# Patient Record
Sex: Female | Born: 1960
Health system: Southern US, Community
[De-identification: ages and names within clinical notes are randomized; demographics above are authoritative.]

## PROBLEM LIST (undated history)

## (undated) DIAGNOSIS — G709 Myoneural disorder, unspecified: Secondary | ICD-10-CM

## (undated) DIAGNOSIS — IMO0002 Reserved for concepts with insufficient information to code with codable children: Secondary | ICD-10-CM

## (undated) DIAGNOSIS — D649 Anemia, unspecified: Secondary | ICD-10-CM

## (undated) DIAGNOSIS — K5090 Crohn's disease, unspecified, without complications: Secondary | ICD-10-CM

## (undated) DIAGNOSIS — M810 Age-related osteoporosis without current pathological fracture: Secondary | ICD-10-CM

## (undated) DIAGNOSIS — K219 Gastro-esophageal reflux disease without esophagitis: Secondary | ICD-10-CM

## (undated) DIAGNOSIS — M1990 Unspecified osteoarthritis, unspecified site: Secondary | ICD-10-CM

## (undated) DIAGNOSIS — J45909 Unspecified asthma, uncomplicated: Secondary | ICD-10-CM

## (undated) DIAGNOSIS — T148XXA Other injury of unspecified body region, initial encounter: Secondary | ICD-10-CM

## (undated) DIAGNOSIS — C189 Malignant neoplasm of colon, unspecified: Secondary | ICD-10-CM

## (undated) DIAGNOSIS — T7840XA Allergy, unspecified, initial encounter: Secondary | ICD-10-CM

## (undated) DIAGNOSIS — K449 Diaphragmatic hernia without obstruction or gangrene: Secondary | ICD-10-CM

## (undated) DIAGNOSIS — L309 Dermatitis, unspecified: Secondary | ICD-10-CM

## (undated) HISTORY — DX: Unspecified osteoarthritis, unspecified site: M19.90

## (undated) HISTORY — DX: Diaphragmatic hernia without obstruction or gangrene: K44.9

## (undated) HISTORY — DX: Myoneural disorder, unspecified: G70.9

## (undated) HISTORY — DX: Crohn's disease, unspecified, without complications: K50.90

## (undated) HISTORY — DX: Dermatitis, unspecified: L30.9

## (undated) HISTORY — DX: Malignant neoplasm of colon, unspecified: C18.9

## (undated) HISTORY — DX: Unspecified asthma, uncomplicated: J45.909

## (undated) HISTORY — DX: Anemia, unspecified: D64.9

## (undated) HISTORY — DX: Age-related osteoporosis without current pathological fracture: M81.0

## (undated) HISTORY — PX: OTHER SURGICAL HISTORY: SHX169

## (undated) HISTORY — DX: Reserved for concepts with insufficient information to code with codable children: IMO0002

## (undated) HISTORY — DX: Gastro-esophageal reflux disease without esophagitis: K21.9

## (undated) HISTORY — DX: Allergy, unspecified, initial encounter: T78.40XA

---

## 2012-10-03 LAB — HM COLONOSCOPY

## 2013-11-20 HISTORY — PX: NASAL SINUS SURGERY: SHX719

## 2020-07-30 ENCOUNTER — Other Ambulatory Visit: Payer: Self-pay

## 2020-07-30 DIAGNOSIS — Z20822 Contact with and (suspected) exposure to covid-19: Secondary | ICD-10-CM

## 2020-08-02 LAB — NOVEL CORONAVIRUS, NAA: SARS-CoV-2, NAA: NOT DETECTED

## 2021-01-20 ENCOUNTER — Other Ambulatory Visit: Payer: Self-pay | Admitting: Orthopedic Surgery

## 2021-01-20 ENCOUNTER — Other Ambulatory Visit: Payer: Self-pay

## 2021-01-20 ENCOUNTER — Telehealth: Payer: Self-pay | Admitting: Orthopedic Surgery

## 2021-01-20 ENCOUNTER — Telehealth: Payer: Self-pay

## 2021-01-20 DIAGNOSIS — U071 COVID-19: Secondary | ICD-10-CM

## 2021-01-20 MED ORDER — BENZONATATE 100 MG PO CAPS: 100.0000 mg | ORAL_CAPSULE | Freq: Three times a day (TID) | ORAL | 0 refills | Status: DC | PRN

## 2021-01-20 MED ORDER — NAPROXEN 500 MG PO TABS: 500.0000 mg | ORAL_TABLET | Freq: Two times a day (BID) | ORAL | 0 refills | Status: DC

## 2021-01-20 NOTE — Telephone Encounter (Signed)
Patient had a e-visit today.

## 2021-01-20 NOTE — Progress Notes (Signed)
E-Visit for Corona Virus Screening  We are sorry you are not feeling well. We are here to help!  You have tested positive for COVID-19, meaning that you were infected with the novel coronavirus and could give the virus to others.  It is vitally important that you stay home so you do not spread it to others.      Please continue isolation at home, for at least 10 days since the start of your symptoms and until you have had 24 hours with no fever (without taking a fever reducer) and with improving of symptoms.  If you have no symptoms but tested positive (or all symptoms resolve after 5 days and you have no fever) you can leave your house but continue to wear a mask around others for an additional 5 days. If you have a fever,continue to stay home until you have had 24 hours of no fever. Most cases improve 5-10 days from onset but we have seen a small number of patients who have gotten worse after the 10 days.  Please be sure to watch for worsening symptoms and remain taking the proper precautions.   Go to the nearest hospital ED for assessment if fever/cough/breathlessness are severe or illness seems like a threat to life.    The following symptoms may appear 2-14 days after exposure: . Fever . Cough . Shortness of breath or difficulty breathing . Chills . Repeated shaking with chills . Muscle pain . Headache . Sore throat . New loss of taste or smell . Fatigue . Congestion or runny nose . Nausea or vomiting . Diarrhea  You have been enrolled in [facility] for COVID-19. Daily you will receive a questionnaire within the [facility] website. Our COVID-19 response team will be monitoring your responses daily.  You can use medication such as A prescription cough medication called Tessalon Perles 100 mg. You may take 1-2 capsules every 8 hours as needed for cough and A prescription anti-inflammatory called Naprosyn 500 mg. Take twice daily as needed for fever or body aches for 2  weeks  We have asked the monoclonal antibody team contact you to discuss the infusion with you and potentially set this up. You should hear from them in the next 48 hours.    You may also take acetaminophen (Tylenol) as needed for fever.  HOME CARE: . Only take medications as instructed by your medical team. . Drink plenty of fluids and get plenty of rest. . A steam or ultrasonic humidifier can help if you have congestion.   GET HELP RIGHT AWAY IF YOU HAVE EMERGENCY WARNING SIGNS.  Call 911 or proceed to your closest emergency facility if: . You develop worsening high fever. . Trouble breathing . Bluish lips or face . Persistent pain or pressure in the chest . New confusion . Inability to wake or stay awake . You cough up blood. . Your symptoms become more severe . Inability to hold down food or fluids  This list is not all possible symptoms. Contact your medical provider for any symptoms that are severe or concerning to you.    Your e-visit answers were reviewed by a board certified advanced clinical practitioner to complete your personal care plan.  Depending on the condition, your plan could have included both over the counter or prescription medications.  If there is a problem please reply once you have received a response from your provider.  Your safety is important to [place].  If you have drug allergies check  your prescription carefully.    You can use MyChart to ask questions about today's visit, request a non-urgent call back, or ask for a work or school excuse for 24 hours related to this e-Visit. If it has been greater than 24 hours you will need to follow up with your provider, or enter a new e-Visit to address those concerns. You will get an e-mail in the next two days asking about your experience.  I hope that your e-visit has been valuable and will speed your recovery. Thank you for using e-visits.    Greater than 5 minutes, yet less than 10 minutes of time have been  spent researching, coordinating and implementing care for this patient today.

## 2021-01-21 ENCOUNTER — Telehealth: Payer: Self-pay | Admitting: Unknown Physician Specialty

## 2021-01-21 ENCOUNTER — Telehealth: Payer: Self-pay

## 2021-01-21 ENCOUNTER — Ambulatory Visit (HOSPITAL_COMMUNITY)
Admission: RE | Admit: 2021-01-21 | Discharge: 2021-01-21 | Disposition: A | Discharge status: Home / Self Care | Payer: BC Managed Care – PPO | Source: Ambulatory Visit | Attending: Pulmonary Disease | Admitting: Pulmonary Disease

## 2021-01-21 ENCOUNTER — Other Ambulatory Visit: Payer: Self-pay | Admitting: Nurse Practitioner

## 2021-01-21 DIAGNOSIS — U071 COVID-19: Secondary | ICD-10-CM | POA: Insufficient documentation

## 2021-01-21 LAB — BASIC METABOLIC PANEL
Anion gap: 10 (ref 5–15)
BUN: 10 mg/dL (ref 6–20)
CO2: 23 mmol/L (ref 22–32)
Calcium: 9.2 mg/dL (ref 8.9–10.3)
Chloride: 101 mmol/L (ref 98–111)
Creatinine, Ser: 0.76 mg/dL (ref 0.44–1.00)
GFR, Estimated: >60 mL/min (ref >60)
Glucose, Bld: 121 mg/dL — ABNORMAL HIGH (ref 70–99)
Potassium: 4 mmol/L (ref 3.5–5.1)
Sodium: 134 mmol/L — ABNORMAL LOW (ref 135–145)

## 2021-01-21 MED ORDER — NIRMATRELVIR/RITONAVIR (PAXLOVID)TABLET: 3.0000 | ORAL_TABLET | Freq: Two times a day (BID) | ORAL | 0 refills | Status: AC

## 2021-01-21 MED FILL — PAXLOVID 20 X 150 MG & 10 X: 20 X 150 MG | 5 days supply | Qty: 30 | Fill #0

## 2021-01-21 NOTE — Telephone Encounter (Signed)
Called to discuss with patient about COVID-19 symptoms and the use of one of the available treatments for those with mild to moderate Covid symptoms and at a high risk of hospitalization.  Pt appears to qualify for outpatient treatment due to co-morbid conditions and/or a member of an at-risk group in accordance with the FDA Emergency Use Authorization.    Symptom onset: 01/20/21 Cough,sore throat,chills Vaccinated: Yes Booster? Yes Immunocompromised?  Qualifiers: Asthma Home test Pt. Would like to speak with APP.  Marcello Moores

## 2021-01-21 NOTE — Telephone Encounter (Signed)
Outpatient Oral COVID Treatment Note  I connected with Lindsey Knight on 01/21/2021/2:49 PM by telephone and verified that I am speaking with the correct person using two identifiers.  I discussed the limitations, risks, security, and privacy concerns of performing an evaluation and management service by telephone and the availability of in person appointments. I also discussed with the patient that there may be a patient responsible charge related to this service. The patient expressed understanding and agreed to proceed.  Patient location: home Provider location: home  Diagnosis: COVID-19 infection  Purpose of visit: Discussion of potential use of Molnupiravir or Paxlovid, a new treatment for mild to moderate COVID-19 viral infection in non-hospitalized patients.   Subjective: Patient is a 60 y.o. female who has been diagnosed with COVID 19 viral infection.  Their symptoms began on 01/20/21 with sore throat cough.    No past medical history on file.  Not on File   Current Outpatient Medications:  .  albuterol (VENTOLIN HFA) 108 (90 Base) MCG/ACT inhaler, Inhale 1 puff into the lungs every 6 (six) hours as needed., Disp: , Rfl:  .  benzonatate (TESSALON) 100 MG capsule, Take 1 capsule (100 mg total) by mouth 3 (three) times daily as needed for cough., Disp: 20 capsule, Rfl: 0 .  Fluticasone-Salmeterol (ADVAIR) 100-50 MCG/DOSE AEPB, Inhale 1 puff into the lungs 2 (two) times daily as needed., Disp: , Rfl:  .  montelukast (SINGULAIR) 10 MG tablet, Take 10 mg by mouth at bedtime., Disp: , Rfl:  .  naproxen (NAPROSYN) 500 MG tablet, Take 1 tablet (500 mg total) by mouth 2 (two) times daily with a meal., Disp: 60 tablet, Rfl: 0 .  traZODone (DESYREL) 50 MG tablet, Take 50 mg by mouth at bedtime., Disp: , Rfl:   Objective: Patient appears/sounds congested.  They are in no apparent distress.  Breathing is non labored.  Mood and behavior are normal.  Laboratory Data:  Recent Results (from the past  2160 hour(s))  Basic metabolic panel     Status: Abnormal   Collection Time: 01/21/21  2:09 PM  Result Value Ref Range   Sodium 134 (L) 135 - 145 mmol/L   Potassium 4.0 3.5 - 5.1 mmol/L   Chloride 101 98 - 111 mmol/L   CO2 23 22 - 32 mmol/L   Glucose, Bld 121 (H) 70 - 99 mg/dL    Comment: Glucose reference range applies only to samples taken after fasting for at least 8 hours.   BUN 10 6 - 20 mg/dL   Creatinine, Ser 0.76 0.44 - 1.00 mg/dL   Calcium 9.2 8.9 - 10.3 mg/dL   GFR, Estimated >60 >60 mL/min    Comment: (NOTE) Calculated using the CKD-EPI Creatinine Equation (2021)    Anion gap 10 5 - 15    Comment: Performed at [facility], [place] [address], [place], [place] [address]     Assessment: 60 y.o. female with mild/moderate COVID 19 viral infection diagnosed on 01/20/2021 at high risk for progression to severe COVID 19.  Plan:  This patient is a 60 y.o. female that meets the following criteria for Emergency Use Authorization of: Paxlovid 1. Age >12 yr AND > 40 kg 2. SARS-COV-2 positive test 3. Symptom onset < 5 days 4. Mild-to-moderate COVID disease with high risk for severe progression to hospitalization or death  I have spoken and communicated the following to the patient or parent/caregiver regarding: 1. Paxlovid is an unapproved drug that is authorized for use under an  Emergency Use Authorization.  2. There are no adequate, approved, available products for the treatment of COVID-19 in adults who have mild-to-moderate COVID-19 and are at high risk for progressing to severe COVID-19, including hospitalization or death. 3. Other therapeutics are currently authorized. For additional information on all products authorized for treatment or prevention of COVID-19, please see [employer].  4. There are benefits and risks of taking this treatment as outlined  in the "Fact Sheet for Patients and Caregivers."  5. "Fact Sheet for Patients and Caregivers" was reviewed with patient. A hard copy will be provided to patient from pharmacy prior to the patient receiving treatment. 6. Patients should continue to self-isolate and use infection control measures (e.g., wear mask, isolate, social distance, avoid sharing personal items, clean and disinfect "high touch" surfaces, and frequent handwashing) according to CDC guidelines.  7. The patient or parent/caregiver has the option to accept or refuse treatment. 8. Patient medication history was reviewed for potential drug interactions:Interaction with home meds: steroid inhaler she will stop during Paxlovid 9. Patient's GFR was calculated to be >60, and they were therefore prescribed Normal dose (GFR>60) - nirmatrelvir 156m tab (2 tablet) by mouth twice daily AND ritonavir 1022mtab (1 tablet) by mouth twice daily   After reviewing above information with the patient, the patient agrees to receive Paxlovid.  Follow up instructions:    . Take prescription BID x 5 days as directed . Reach out to pharmacist for counseling on medication if desired . For concerns regarding further COVID symptoms please follow up with your PCP or urgent care . For urgent or life-threatening issues, seek care at your local emergency department  The patient was provided an opportunity to ask questions, and all were answered. The patient agreed with the plan and demonstrated an understanding of the instructions.   [facility]  The patient was advised to call their PCP or seek an in-person evaluation if the symptoms worsen or if the condition fails to improve as anticipated.   I provided 15 minutes of non face-to-face telephone visit time during this encounter, and > 50% was spent counseling as documented under my assessment & plan.  ChKathrine HaddockNP 01/21/2021 /2:49 PM

## 2021-01-21 NOTE — Progress Notes (Signed)
Called to Discuss with patient about Covid symptoms and the use of the monoclonal antibody/antiviral infusion/oral therapies for those with mild to moderate Covid symptoms and at a high risk of hospitalization.     Pt appears to qualify for this infusion due to co-morbid conditions and/or a member of an at-risk group in accordance with the FDA Emergency Use Authorization.   Pt is qualified for the monoclonal antibody infusion, but due to medication shortages we cannot offer the pt the infusion at this time. This decision was based on clinical judgement as well as the the NIH Covid 19 treatment guidelines for treatment prioritization and equitable access. Symptoms tier reviewed as well as criteria for ending isolation. Preventative practices reviewed. Patient verbalized understanding.   Symptom onset: 01/20/2021 Vaccinated: Yes  Booster:Yes Immunocompromised: Qualifiers: Asthma/BMI  Patient declines infusion and is requesting Paxlovid. She has not labs since 05/2020. Education provided on the need for updated labwork prior to prescribing. She is in agreement and is aware staff will be arranging for labs to be done.   Education provided on Paxlovid. Patient verbalized understanding.   Alda Lea, NP [facility]  [phone]

## 2021-01-28 ENCOUNTER — Encounter (INDEPENDENT_AMBULATORY_CARE_PROVIDER_SITE_OTHER): Payer: Self-pay

## 2021-01-28 ENCOUNTER — Telehealth: Payer: Self-pay

## 2021-01-28 NOTE — Telephone Encounter (Signed)
Patient states that she has post nasal drip and that's were the cough is coming from. Patient was advise on cough per protocol and verbalized understanding.    If cough remains the same or better: continue to treat with over the counter medications. Hard candy or cough drops and drinking warm fluids. Adults can also use honey 2 tsp (10 ML) at bedtime.   HONEY IS NOT RECOMMENDED FOR INFANTS UNDER ONE.    If cough is becoming worse even with the use of over the counter medications and patient is not able to sleep at night, cough becomes productive with sputum that maybe yellow or green in color, contact PCP.

## 2021-07-26 ENCOUNTER — Ambulatory Visit (INDEPENDENT_AMBULATORY_CARE_PROVIDER_SITE_OTHER): Payer: BC Managed Care – PPO | Admitting: Physician Assistant

## 2021-07-26 ENCOUNTER — Other Ambulatory Visit: Payer: Self-pay

## 2021-07-26 ENCOUNTER — Encounter: Payer: Self-pay | Admitting: Physician Assistant

## 2021-07-26 VITALS — BP 130/70 | HR 88 | Temp 97.9°F | Ht 63.0 in | Wt 161.2 lb

## 2021-07-26 DIAGNOSIS — G4700 Insomnia, unspecified: Secondary | ICD-10-CM

## 2021-07-26 DIAGNOSIS — Z23 Encounter for immunization: Secondary | ICD-10-CM | POA: Diagnosis not present

## 2021-07-26 DIAGNOSIS — R222 Localized swelling, mass and lump, trunk: Secondary | ICD-10-CM | POA: Diagnosis not present

## 2021-07-26 DIAGNOSIS — Z9109 Other allergy status, other than to drugs and biological substances: Secondary | ICD-10-CM | POA: Diagnosis not present

## 2021-07-26 MED ORDER — TRAZODONE HCL 50 MG PO TABS: ORAL_TABLET | ORAL | 0 refills | Status: DC

## 2021-07-26 MED ORDER — MONTELUKAST SODIUM 10 MG PO TABS
10.0000 mg | ORAL_TABLET | Freq: Every day | ORAL | 3 refills | Status: DC
Start: 2021-07-26 — End: 2022-06-28

## 2021-07-26 NOTE — Patient Instructions (Signed)
It was great to see you!  Please schedule your mammogram  Trial trazodone 50 mg daily x 3 days Then increase 100 mg daily   If no improvement in sleep, let me know in 2-4 weeks  Sleep Hygiene  Do: (1) Go to bed at the same time each day. (2) Get up from bed at the same time each day. (3) Get regular exercise each day, preferably in the morning.  There is goof evidence that regular exercise improves restful sleep.  This includes stretching and aerobic exercise. (4) Get regular exposure to outdoor or bright lights, especially in the late afternoon. (5) Keep the temperature in your bedroom comfortable. (6) Keep the bedroom quiet when sleeping. (7) Keep the bedroom dark enough to facilitate sleep. (8) Use your bed only for sleep and sex. (9) Take medications as directed.  It is helpful to take prescribed sleeping pills 1 hour before bedtime, so they are causing drowsiness when you lie down, or 10 hours before getting up, to avoid daytime drowsiness. (10) Use a relaxation exercise just before going to sleep -- imagery, massage, warm bath. (11) Keep your feet and hands warm.  Wear warm socks and/or mittens or gloves to bed.  Don't: (1) Exercise just before going to bed. (2) Engage in stimulating activity just before bed, such as playing a competitive game, watching an exciting program on television, or having an important discussion with a loved one. (3) Have caffeine in the evening (coffee, teas, chocolate, sodas, etc.) (4) Read or watch television in bed. (5) Use alcohol to help you sleep. (6) Go to bed too hungry or too full. (7) Take another person's sleeping pills. (8) Take over-the-counter sleeping pills, without your doctor's knowledge.  Tolerance can develop rapidly with these medications.  Diphenhydramine can have serious side effects for elderly patients. (9) Take daytime naps. (10) Command yourself to go to sleep.  This only makes your mind and body more alert.  If you lie  awake for more than 20-30 minutes, get up, go to a different room, participate in a quiet activity (Ex - non-excitable reading or television), and then return to bed when you feel sleepy.  Do this as many times during the night as needed.  This may cause you to have a night or two of poor sleep but it will train your brain to know when it is time for sleep.  ---  If a referral was placed today, you will be contacted for an appointment. Please note that routine referrals can sometimes take up to 3-4 weeks to process. Please call our office if you haven't heard anything after this time frame.  Take care,  [name] PA-C

## 2021-07-26 NOTE — Progress Notes (Signed)
Lindsey Knight is a 60 y.o. female here to establish care..  I acted as a Education administrator for [employer], PA-C Anselmo Pickler, LPN   History of Present Illness:   Chief Complaint  Patient presents with   Establish Care   Insomnia   Mass   Insomnia Pt has been having trouble sleeping for years. She was on Ambien 5 mg HS but ran out in November 2021. Was prescribed this about 10 years ago. Was previously prescribed trazodone 50 mg but did not help with this.  Pt has tried Melatonin OTC no help, benadryl causing grogginess. She also trialed trazodone 50 mg in the past without any relief of symptoms.  Allergies Takes singulair prn and would like refill. Works well for her breathing.  Lump Pt has a lump mid back area since April. Pt says that it is not painful. Denies fever, chills, bony pain, known trauma.  Weight -- Weight: 161 lb 4 oz (73.1 kg)   No flowsheet data found.  No flowsheet data found.   Other providers/specialists: Patient Care Team: [name], [place] as PCP - General (Physician Assistant)   Past Medical History:  Diagnosis Date   Asthma    Vaginal delivery    x 2, 1985, 1990     Social History   Tobacco Use   Smoking status: Never   Smokeless tobacco: Never  Vaping Use   Vaping Use: Never used  Substance Use Topics   Alcohol use: Yes    Alcohol/week: 4.0 standard drinks    Types: 4 Shots of liquor per week   Drug use: Yes    Types: Other-see comments    Comment: THC gummies    Past Surgical History:  Procedure Laterality Date   NASAL SINUS SURGERY Bilateral 2015    History reviewed. No pertinent family history.  Not on File   Current Medications:   Current Outpatient Medications:    albuterol (VENTOLIN HFA) 108 (90 Base) MCG/ACT inhaler, Inhale 1 puff into the lungs every 6 (six) hours as needed., Disp: , Rfl:    Cannabinoids (THC FREE PO), Take 5-10 mg by mouth at bedtime., Disp: , Rfl:    fexofenadine (ALLEGRA) 180 MG tablet, Take 180  mg by mouth daily., Disp: , Rfl:    Fluticasone-Salmeterol (ADVAIR) 100-50 MCG/DOSE AEPB, Inhale 1 puff into the lungs 2 (two) times daily as needed., Disp: , Rfl:    montelukast (SINGULAIR) 10 MG tablet, Take 1 tablet (10 mg total) by mouth at bedtime., Disp: 90 tablet, Rfl: 3   Multiple Vitamin (MULTIVITAMIN) tablet, Take 1 tablet by mouth. 1-2 times a week, Disp: , Rfl:    traZODone (DESYREL) 50 MG tablet, Take 1 tablet daily x 3 days, then may increase to 100 mg daily, Disp: 90 tablet, Rfl: 0   Review of Systems:   ROS Negative unless otherwise specified per HPI.  Vitals:   Vitals:   07/26/21 1337  BP: 130/70  Pulse: 88  Temp: 97.9 F (36.6 C)  TempSrc: Temporal  SpO2: 98%  Weight: 161 lb 4 oz (73.1 kg)  Height: 5' 3"  (1.6 m)      Body mass index is 28.56 kg/m.  Physical Exam:   Physical Exam Vitals and nursing note reviewed.  Constitutional:      General: She is not in acute distress.    Appearance: She is well-developed. She is not ill-appearing or toxic-appearing.  Cardiovascular:     Rate and Rhythm: Normal rate and regular rhythm.  Pulses: Normal pulses.     Heart sounds: Normal heart sounds, S1 normal and S2 normal.     Comments: No LE edema Pulmonary:     Effort: Pulmonary effort is normal.     Breath sounds: Normal breath sounds.  Musculoskeletal:       Arms:  Skin:    General: Skin is warm and dry.  Neurological:     Mental Status: She is alert.     GCS: GCS eye subscore is 4. GCS verbal subscore is 5. GCS motor subscore is 6.  Psychiatric:        Speech: Speech normal.        Behavior: Behavior normal. Behavior is cooperative.     Assessment and Plan:   Lindsey Knight was seen today for establish care, insomnia and mass.  Diagnoses and all orders for this visit:  Insomnia, unspecified type Uncontrolled Sleepy hygiene discussed Trial trazodone 50 mg x 3 days, then increase to 100 mg daily If no improvement, will trial ambien 5 mg  Mass on  back Suspect lipoma Ultrasound ordered Further work-up on based on results -     US Abdomen Limited; Future  Environmental allergies Refill singulair 10 mg daily Follow-up as needed  Other orders -     traZODone (DESYREL) 50 MG tablet; Take 1 tablet daily x 3 days, then may increase to 100 mg daily -     montelukast (SINGULAIR) 10 MG tablet; Take 1 tablet (10 mg total) by mouth at bedtime.  CMA or LPN served as scribe during this visit. History, Physical, and Plan performed by medical provider. The above documentation has been reviewed and is accurate and complete.  [name], PA-C

## 2021-07-28 ENCOUNTER — Ambulatory Visit
Admission: RE | Admit: 2021-07-28 | Discharge: 2021-07-28 | Disposition: A | Discharge status: Home / Self Care | Payer: BC Managed Care – PPO | Source: Ambulatory Visit | Attending: Physician Assistant | Admitting: Physician Assistant

## 2021-07-28 ENCOUNTER — Encounter: Payer: Self-pay | Admitting: Physician Assistant

## 2021-07-28 DIAGNOSIS — Z1231 Encounter for screening mammogram for malignant neoplasm of breast: Secondary | ICD-10-CM | POA: Diagnosis not present

## 2021-07-28 DIAGNOSIS — R222 Localized swelling, mass and lump, trunk: Secondary | ICD-10-CM

## 2021-07-28 DIAGNOSIS — R1900 Intra-abdominal and pelvic swelling, mass and lump, unspecified site: Secondary | ICD-10-CM | POA: Diagnosis not present

## 2021-07-28 LAB — HM MAMMOGRAPHY

## 2021-08-10 ENCOUNTER — Encounter: Payer: Self-pay | Admitting: Physician Assistant

## 2021-08-24 ENCOUNTER — Encounter: Payer: Self-pay | Admitting: Physician Assistant

## 2021-08-24 ENCOUNTER — Other Ambulatory Visit: Payer: Self-pay | Admitting: Physician Assistant

## 2021-08-25 ENCOUNTER — Encounter: Payer: Self-pay | Admitting: Physician Assistant

## 2021-08-25 NOTE — Telephone Encounter (Signed)
Okay to send in Trazodone 100 mg daily at hs?

## 2021-08-26 ENCOUNTER — Other Ambulatory Visit (HOSPITAL_COMMUNITY): Payer: Self-pay

## 2021-08-26 MED ORDER — TRAZODONE HCL 100 MG PO TABS
100.0000 mg | ORAL_TABLET | Freq: Every day | ORAL | 0 refills | Status: DC
  Filled 2021-08-26: qty 90, 90d supply, fill #0

## 2021-08-29 ENCOUNTER — Other Ambulatory Visit (HOSPITAL_COMMUNITY): Payer: Self-pay

## 2021-08-29 MED ORDER — TRAZODONE HCL 100 MG PO TABS: 100.0000 mg | ORAL_TABLET | Freq: Every day | ORAL | 0 refills | Status: DC

## 2021-08-29 NOTE — Telephone Encounter (Signed)
[place] and spoke to [place] told her to cancel Rx for Trazodone 100 mg. [place] verbalized understanding. Rx resent to [facility] as requested by patient.

## 2021-08-29 NOTE — Addendum Note (Signed)
Addended by: Marian Sorrow on: 08/29/2021 09:58 AM   Modules accepted: Orders

## 2021-11-02 ENCOUNTER — Other Ambulatory Visit: Payer: Self-pay | Admitting: Physician Assistant

## 2021-11-02 MED ORDER — TRAZODONE HCL 100 MG PO TABS: 100.0000 mg | ORAL_TABLET | Freq: Every day | ORAL | 0 refills | Status: DC

## 2021-11-18 ENCOUNTER — Ambulatory Visit (INDEPENDENT_AMBULATORY_CARE_PROVIDER_SITE_OTHER): Payer: BC Managed Care – PPO | Admitting: Physician Assistant

## 2021-11-18 ENCOUNTER — Other Ambulatory Visit (INDEPENDENT_AMBULATORY_CARE_PROVIDER_SITE_OTHER): Payer: BC Managed Care – PPO

## 2021-11-18 ENCOUNTER — Encounter: Payer: Self-pay | Admitting: Physician Assistant

## 2021-11-18 ENCOUNTER — Other Ambulatory Visit: Payer: BC Managed Care – PPO

## 2021-11-18 ENCOUNTER — Other Ambulatory Visit: Payer: Self-pay

## 2021-11-18 ENCOUNTER — Ambulatory Visit (INDEPENDENT_AMBULATORY_CARE_PROVIDER_SITE_OTHER): Payer: BC Managed Care – PPO

## 2021-11-18 VITALS — BP 134/74 | HR 77 | Temp 98.2°F | Ht 63.0 in | Wt 151.2 lb

## 2021-11-18 DIAGNOSIS — D691 Qualitative platelet defects: Secondary | ICD-10-CM

## 2021-11-18 DIAGNOSIS — R071 Chest pain on breathing: Secondary | ICD-10-CM

## 2021-11-18 DIAGNOSIS — D649 Anemia, unspecified: Secondary | ICD-10-CM | POA: Diagnosis not present

## 2021-11-18 DIAGNOSIS — D75839 Thrombocytosis, unspecified: Secondary | ICD-10-CM | POA: Diagnosis not present

## 2021-11-18 DIAGNOSIS — R0781 Pleurodynia: Secondary | ICD-10-CM | POA: Diagnosis not present

## 2021-11-18 LAB — COMPREHENSIVE METABOLIC PANEL
ALT: 11 U/L (ref 0–35)
AST: 21 U/L (ref 0–37)
Albumin: 4.1 g/dL (ref 3.5–5.2)
Alkaline Phosphatase: 87 U/L (ref 39–117)
BUN: 11 mg/dL (ref 6–23)
CO2: 26 mEq/L (ref 19–32)
Calcium: 9.2 mg/dL (ref 8.4–10.5)
Chloride: 103 mEq/L (ref 96–112)
Creatinine, Ser: 0.8 mg/dL (ref 0.40–1.20)
GFR: 79.46 mL/min (ref >60.00)
Glucose, Bld: 112 mg/dL — ABNORMAL HIGH (ref 70–99)
Potassium: 3.8 mEq/L (ref 3.5–5.1)
Sodium: 136 mEq/L (ref 135–145)
Total Bilirubin: 0.3 mg/dL (ref 0.2–1.2)
Total Protein: 7.5 g/dL (ref 6.0–8.3)

## 2021-11-18 LAB — CBC WITH DIFFERENTIAL/PLATELET
Basophils Absolute: 0 10*3/uL (ref 0.0–0.1)
Basophils Relative: 0.7 % (ref 0.0–3.0)
Eosinophils Absolute: 0.4 10*3/uL (ref 0.0–0.7)
Eosinophils Relative: 6.1 % — ABNORMAL HIGH (ref 0.0–5.0)
HCT: 33.6 % — ABNORMAL LOW (ref 36.0–46.0)
Hemoglobin: 11 g/dL — ABNORMAL LOW (ref 12.0–15.0)
Lymphocytes Relative: 28.4 % (ref 12.0–46.0)
Lymphs Abs: 1.7 10*3/uL (ref 0.7–4.0)
MCHC: 32.8 g/dL (ref 30.0–36.0)
MCV: 74.1 fl — ABNORMAL LOW (ref 78.0–100.0)
Monocytes Absolute: 0.6 10*3/uL (ref 0.1–1.0)
Monocytes Relative: 9.4 % (ref 3.0–12.0)
Neutro Abs: 3.2 10*3/uL (ref 1.4–7.7)
Neutrophils Relative %: 55.4 % (ref 43.0–77.0)
Platelets: 444 10*3/uL — ABNORMAL HIGH (ref 150.0–400.0)
RBC: 4.53 Mil/uL (ref 3.87–5.11)
RDW: 15.2 % (ref 11.5–15.5)
WBC: 5.9 10*3/uL (ref 4.0–10.5)

## 2021-11-18 NOTE — Patient Instructions (Signed)
It was great to see you!  An order for an xray and labs has been put in for you. To get your xray, you can walk in at the [facility] location without a scheduled appointment.  The address is [address]. It is across the street from [place] is located in the basement.  Hours of operation are M-F 8:30am to 5:00pm. Please note that they are closed for lunch between 12:30 and 1:00pm.  If new chest pain or shortness of breath, please go to the ER.  Take care,  [name] PA-C

## 2021-11-18 NOTE — Progress Notes (Signed)
Lindsey Knight is a 60 y.o. female here for elevated platelet count and rib pain.   History of Present Illness:   Chief Complaint  Patient presents with   Chest Pain    Fall in October ,pain left and came back.    HPI  Elevated Blood Platelet Count According to [facility], she has been giving platelets for some time, but was recently alerted that her platelets had increased.  Denies hx of blood clots, LE swelling, known personal clotting disorder or sickle cell. Listed below are pass platelets results following her donations, presented by patient.   11/08/21 -- 12.8 hgb, 705 platelets 10/25/21 - 12.8 hgb, 504 platelets 09/23/21 - 12.5 hgb, 404 platelets  Chest Pain In addition to her high blood platelet count, she has been experiencing right sided rib pain and right sided chest pain. [name] does recall falling while on a hike and injuring the right side of her chest back in October of this year. Reports that she felt sore after the fall for a couple of days, but this soon resolved itself. As of couple of weeks ago, the pain has returned and is described as a poking sensation. She states that this sensation isn't necessarily painful but worsens upon inhalation.  At this time she finds it hard to sleep due to the discomfort that she feels. Pt does have a hx of asthma.  She has used ibuprofen and this was helpful.  [name] did recently have what she believes to be influenza earlier this month while she was in [place]. During that time she was experiencing fever, chills, and myalgia. In an effort to treat her sx, she took azithromycin on 11/01/21 which provided relief. Denies SOB, night sweats, hemoptysis.  She is UTD on mammogram.  Past Medical History:  Diagnosis Date   Asthma    Vaginal delivery    x 2, 1985, 1990     Social History   Tobacco Use   Smoking status: Never   Smokeless tobacco: Never  Vaping Use   Vaping Use: Never used  Substance Use Topics   Alcohol use: Yes     Alcohol/week: 4.0 standard drinks    Types: 4 Shots of liquor per week   Drug use: Yes    Types: Other-see comments    Comment: THC gummies    Past Surgical History:  Procedure Laterality Date   NASAL SINUS SURGERY Bilateral 2015    History reviewed. No pertinent family history.  Not on File  Current Medications:   Current Outpatient Medications:    albuterol (VENTOLIN HFA) 108 (90 Base) MCG/ACT inhaler, Inhale 1 puff into the lungs every 6 (six) hours as needed., Disp: , Rfl:    Cannabinoids (THC FREE PO), Take 5-10 mg by mouth at bedtime., Disp: , Rfl:    fexofenadine (ALLEGRA) 180 MG tablet, Take 180 mg by mouth daily., Disp: , Rfl:    Fluticasone-Salmeterol (ADVAIR) 100-50 MCG/DOSE AEPB, Inhale 1 puff into the lungs 2 (two) times daily as needed., Disp: , Rfl:    montelukast (SINGULAIR) 10 MG tablet, Take 1 tablet (10 mg total) by mouth at bedtime., Disp: 90 tablet, Rfl: 3   Multiple Vitamin (MULTIVITAMIN) tablet, Take 1 tablet by mouth. 1-2 times a week, Disp: , Rfl:    traZODone (DESYREL) 100 MG tablet, Take 1 tablet (100 mg total) by mouth at bedtime., Disp: 90 tablet, Rfl: 0   Review of Systems:   ROS Negative unless otherwise specified per HPI. Vitals:  Vitals:   11/18/21 1125  BP: 134/74  Pulse: 77  Temp: 98.2 F (36.8 C)  TempSrc: Temporal  SpO2: 98%  Weight: 151 lb 3.2 oz (68.6 kg)  Height: 5' 3"  (1.6 m)     Body mass index is 26.78 kg/m.  Physical Exam:   Physical Exam Vitals and nursing note reviewed.  Constitutional:      General: She is not in acute distress.    Appearance: She is well-developed. She is not ill-appearing or toxic-appearing.  Cardiovascular:     Rate and Rhythm: Normal rate and regular rhythm.     Pulses: Normal pulses.     Heart sounds: Normal heart sounds, S1 normal and S2 normal.  Pulmonary:     Effort: Pulmonary effort is normal.     Breath sounds: Normal breath sounds.  Chest:     Chest wall: Tenderness present.      Comments: Tenderness slightly above right breast, reproducible upon deep palpation Skin:    General: Skin is warm and dry.  Neurological:     Mental Status: She is alert.     GCS: GCS eye subscore is 4. GCS verbal subscore is 5. GCS motor subscore is 6.  Psychiatric:        Speech: Speech normal.        Behavior: Behavior normal. Behavior is cooperative.    Assessment and Plan:   Chest pain on breathing EKG tracing is personally reviewed.  EKG notes NSR.  No acute changes.  Possible costochondritis CXR ordered for further evaluation Informed patient that if new chest pain or shortness of breath occurs, to visit the ER immediately Follow up as indicated by results  Abnormal platelets (HCC) Unclear etiology Repeat CBC and add smear Low threshold to refer to hematology for further evaluation  Patient verbalized understanding to plan.  I,Havlyn C Ratchford,acting as a Education administrator for [employer], PA.,have documented all relevant documentation on the behalf of [name], PA,as directed by  [name], PA while in the presence of [name], [place].  I, [name], [place], have reviewed all documentation for this visit. The documentation on 11/18/21 for the exam, diagnosis, procedures, and orders are all accurate and complete.  [name], PA-C

## 2021-11-22 ENCOUNTER — Encounter: Payer: Self-pay | Admitting: Physician Assistant

## 2021-11-22 ENCOUNTER — Other Ambulatory Visit: Payer: Self-pay | Admitting: Physician Assistant

## 2021-11-22 DIAGNOSIS — R071 Chest pain on breathing: Secondary | ICD-10-CM

## 2021-11-22 DIAGNOSIS — R7989 Other specified abnormal findings of blood chemistry: Secondary | ICD-10-CM

## 2021-11-22 LAB — PATHOLOGIST SMEAR REVIEW

## 2021-11-29 ENCOUNTER — Ambulatory Visit
Admission: RE | Admit: 2021-11-29 | Discharge: 2021-11-29 | Disposition: A | Discharge status: Home / Self Care | Payer: BC Managed Care – PPO | Source: Ambulatory Visit | Attending: Physician Assistant | Admitting: Physician Assistant

## 2021-11-29 DIAGNOSIS — J9811 Atelectasis: Secondary | ICD-10-CM | POA: Diagnosis not present

## 2021-11-29 DIAGNOSIS — R071 Chest pain on breathing: Secondary | ICD-10-CM

## 2021-11-29 DIAGNOSIS — R918 Other nonspecific abnormal finding of lung field: Secondary | ICD-10-CM | POA: Diagnosis not present

## 2021-11-29 DIAGNOSIS — R590 Localized enlarged lymph nodes: Secondary | ICD-10-CM | POA: Diagnosis not present

## 2021-11-29 MED ORDER — IOPAMIDOL (ISOVUE-300) INJECTION 61%
75.0000 mL | Freq: Once | INTRAVENOUS | Status: AC | PRN
  Administered 2021-11-29: 75 mL via INTRAVENOUS

## 2021-11-30 ENCOUNTER — Other Ambulatory Visit: Payer: Self-pay | Admitting: Physician Assistant

## 2021-11-30 ENCOUNTER — Encounter: Payer: Self-pay | Admitting: Physician Assistant

## 2021-11-30 DIAGNOSIS — D691 Qualitative platelet defects: Secondary | ICD-10-CM

## 2021-11-30 DIAGNOSIS — R9389 Abnormal findings on diagnostic imaging of other specified body structures: Secondary | ICD-10-CM

## 2021-12-08 ENCOUNTER — Other Ambulatory Visit (INDEPENDENT_AMBULATORY_CARE_PROVIDER_SITE_OTHER): Payer: BC Managed Care – PPO

## 2021-12-08 ENCOUNTER — Other Ambulatory Visit: Payer: BC Managed Care – PPO

## 2021-12-08 DIAGNOSIS — R7989 Other specified abnormal findings of blood chemistry: Secondary | ICD-10-CM | POA: Diagnosis not present

## 2021-12-08 DIAGNOSIS — D691 Qualitative platelet defects: Secondary | ICD-10-CM | POA: Diagnosis not present

## 2021-12-08 LAB — IBC + FERRITIN
Ferritin: 4.3 ng/mL — ABNORMAL LOW (ref 10.0–291.0)
Iron: 23 ug/dL — ABNORMAL LOW (ref 42–145)
Saturation Ratios: 5.1 % — ABNORMAL LOW (ref 20.0–50.0)
TIBC: 455 ug/dL — ABNORMAL HIGH (ref 250.0–450.0)
Transferrin: 325 mg/dL (ref 212.0–360.0)

## 2021-12-08 LAB — CBC WITH DIFFERENTIAL/PLATELET
Basophils Absolute: 0 10*3/uL (ref 0.0–0.1)
Basophils Relative: 0.8 % (ref 0.0–3.0)
Eosinophils Absolute: 0.3 10*3/uL (ref 0.0–0.7)
Eosinophils Relative: 6.4 % — ABNORMAL HIGH (ref 0.0–5.0)
HCT: 35.2 % — ABNORMAL LOW (ref 36.0–46.0)
Hemoglobin: 11.2 g/dL — ABNORMAL LOW (ref 12.0–15.0)
Lymphocytes Relative: 34.5 % (ref 12.0–46.0)
Lymphs Abs: 1.7 10*3/uL (ref 0.7–4.0)
MCHC: 31.8 g/dL (ref 30.0–36.0)
MCV: 74.1 fl — ABNORMAL LOW (ref 78.0–100.0)
Monocytes Absolute: 0.5 10*3/uL (ref 0.1–1.0)
Monocytes Relative: 10 % (ref 3.0–12.0)
Neutro Abs: 2.4 10*3/uL (ref 1.4–7.7)
Neutrophils Relative %: 48.3 % (ref 43.0–77.0)
Platelets: 361 10*3/uL (ref 150.0–400.0)
RBC: 4.75 Mil/uL (ref 3.87–5.11)
RDW: 15 % (ref 11.5–15.5)
WBC: 5 10*3/uL (ref 4.0–10.5)

## 2021-12-08 LAB — VITAMIN B12: Vitamin B-12: 209 pg/mL — ABNORMAL LOW (ref 211–911)

## 2021-12-09 ENCOUNTER — Encounter: Payer: Self-pay | Admitting: Physician Assistant

## 2021-12-09 ENCOUNTER — Other Ambulatory Visit: Payer: Self-pay | Admitting: Physician Assistant

## 2021-12-09 DIAGNOSIS — Z1211 Encounter for screening for malignant neoplasm of colon: Secondary | ICD-10-CM

## 2021-12-09 DIAGNOSIS — D509 Iron deficiency anemia, unspecified: Secondary | ICD-10-CM

## 2021-12-09 NOTE — Telephone Encounter (Signed)
Pt saw response from provider.

## 2021-12-12 ENCOUNTER — Encounter: Payer: Self-pay | Admitting: Nurse Practitioner

## 2021-12-13 ENCOUNTER — Ambulatory Visit (INDEPENDENT_AMBULATORY_CARE_PROVIDER_SITE_OTHER): Payer: BC Managed Care – PPO | Admitting: *Deleted

## 2021-12-13 ENCOUNTER — Telehealth: Payer: Self-pay | Admitting: *Deleted

## 2021-12-13 ENCOUNTER — Other Ambulatory Visit: Payer: Self-pay

## 2021-12-13 DIAGNOSIS — E538 Deficiency of other specified B group vitamins: Secondary | ICD-10-CM

## 2021-12-13 MED ORDER — CYANOCOBALAMIN 1000 MCG/ML IJ SOLN
1000.0000 ug | Freq: Once | INTRAMUSCULAR | Status: AC
  Administered 2021-12-13: 11:00:00 1000 ug via INTRAMUSCULAR

## 2021-12-13 MED ORDER — "BD LUER-LOK SYRINGE 25G X 1"" 3 ML MISC": 0 refills | Status: DC

## 2021-12-13 MED ORDER — CYANOCOBALAMIN 1000 MCG/ML IJ SOLN: INTRAMUSCULAR | 0 refills | Status: DC

## 2021-12-13 NOTE — Telephone Encounter (Signed)
Patient came in for her B 12 injection and stated that she was told that she could do them at home. Patient stated she wanted to get set-up so that she can start doing them herself next week.   Zacarias Pontes, CMA

## 2021-12-13 NOTE — Progress Notes (Signed)
Patient came in for weekly B 12 per orders of [name], [place]. Injection given in left deltoid per patient preference. Patient tolerated well. Patient stated that she was told that she could do injections her self at home and would like to get set-up to start doing them next week.  Zacarias Pontes, CMA

## 2021-12-13 NOTE — Addendum Note (Signed)
Addended by: Marian Sorrow on: 12/13/2021 03:10 PM   Modules accepted: Orders

## 2021-12-13 NOTE — Telephone Encounter (Signed)
Spoke to pt told her sending Vit B12 medication and supplies to the pharmacy. [facility]. Pt verbalized understanding. Told pt to schedule lab appt only about 3 weeks after last B12 injection so we can recheck your levels. Pt verbalized understanding.

## 2021-12-15 ENCOUNTER — Other Ambulatory Visit: Payer: Self-pay | Admitting: *Deleted

## 2021-12-15 MED ORDER — CYANOCOBALAMIN 1000 MCG/ML IJ SOLN: INTRAMUSCULAR | 0 refills | Status: DC

## 2021-12-30 ENCOUNTER — Encounter: Payer: Self-pay | Admitting: Nurse Practitioner

## 2021-12-30 ENCOUNTER — Ambulatory Visit: Payer: BC Managed Care – PPO | Admitting: Nurse Practitioner

## 2021-12-30 VITALS — BP 100/60 | HR 77 | Ht 63.0 in | Wt 153.0 lb

## 2021-12-30 DIAGNOSIS — D509 Iron deficiency anemia, unspecified: Secondary | ICD-10-CM | POA: Diagnosis not present

## 2021-12-30 DIAGNOSIS — K5090 Crohn's disease, unspecified, without complications: Secondary | ICD-10-CM | POA: Insufficient documentation

## 2021-12-30 MED ORDER — SUTAB 1479-225-188 MG PO TABS: 1.0000 | ORAL_TABLET | Freq: Once | ORAL | 0 refills | Status: AC

## 2021-12-30 NOTE — Progress Notes (Signed)
ASSESSMENT AND PLAN    # 61 yo female with iron deficiency anemia. Hgb ~ 11, ferritin 4. No overt GI bleeding. No localizing GI symptoms other than occasional heart burn.  She was taking NSAID from October to December. Rule out erosive disease, PUD, intestinal AVMs, neoplasm.  --Schedule for a EGD and colonoscopy for evaluation of iron deficiency anemia. . The risks and benefits of colonoscopy with possible polypectomy / biopsies were discussed and the patient agrees to proceed.  --Hold oral iron 10 days prior to procedures.   # Vitamin B12 deficiency. B12 209. PCP started replacement.   # [facility] of Crohn's disease in sister.   # [facility] of colon cancer in mother but later in life ( age 66)  HISTORY OF PRESENT ILLNESS     Chief Complaint : anemia  Lindsey Knight is a 61 y.o. female with a past medical history significant for asthma,  COVID March 2022 . See PMH below for any additional history.   Patient referred by PCP for anemia. She regular donates platelets on a regular basis. Recenlty called by [employer] and told her that her platelets were high. She saw her PCP, had repeat labs and platelets were okay but her hgb was low. She does recall being a couple of time in the past being turned away to donate platelets due to borderline hgb. She doesn't ever see blood in her stool. No bowel changes or abdominal pain. No urinary or vaginal bleeding. Last colonoscopy was ~ 10 years ago.   Hgb is stable at 11.2 . Iron deficient with a ferritin of 4 .  Also B12 deficient. No known [place] of celiac disease.   She has mild burning in chest sometimes but no other GI symptoms.   She was taking a lot of Ibuprofen from October through early December after a fall sustained while hiking and then also for fever during a bad respiratory illness in November .Saw PCP for a bad cough and had a chest CT scan on 11/29/21  Chest CT >>some patchy ground-glass and adjacent focal clustered nodularity in the right upper  lobe and adjacent band like atelectasis/scarring at the junction of the right upper middle lobes. This is consistent with an infectious/inflammatory process.  Multiple prominent mediastinal and axillary lymph nodes with an enlarged right hilar lymph node and left axillary lymph node. These are favored to be reactive, attention recommended on follow-up CT. Additional 5 mm ground-glass nodular opacity in the left upper lobe, which may or may not be related to the aforementioned infectious/inflammatory process.   She see Pulmonary next Tuesday. However, she is feeling better from a respiratory standpoint.    Data Reviewed:  CBC Latest Ref Rng & Units 12/08/2021 11/18/2021  WBC 4.0 - 10.5 K/uL 5.0 5.9  Hemoglobin 12.0 - 15.0 g/dL 11.2(L) 11.0(L)  Hematocrit 36.0 - 46.0 % 35.2(L) 33.6(L)  Platelets 150.0 - 400.0 K/uL 361.0 444.0(H)    No results found for: LIPASE CMP Latest Ref Rng & Units 11/18/2021 01/21/2021  Glucose 70 - 99 mg/dL 112(H) 121(H)  BUN 6 - 23 mg/dL 11 10  Creatinine 0.40 - 1.20 mg/dL 0.80 0.76  Sodium 135 - 145 mEq/L 136 134(L)  Potassium 3.5 - 5.1 mEq/L 3.8 4.0  Chloride 96 - 112 mEq/L 103 101  CO2 19 - 32 mEq/L 26 23  Calcium 8.4 - 10.5 mg/dL 9.2 9.2  Total Protein 6.0 - 8.3 g/dL 7.5 -  Total Bilirubin 0.2 - 1.2 mg/dL 0.3 -  Alkaline Phos 39 - 117 U/L 87 -  AST 0 - 37 U/L 21 -  ALT 0 - 35 U/L 11 -    Past Medical History:  Diagnosis Date   Asthma    Vaginal delivery    x 2, 1985, 1990     Past Surgical History:  Procedure Laterality Date   NASAL SINUS SURGERY Bilateral 2015   Family History  Problem Relation Age of Onset   Pancreatic cancer Other    Ovarian cancer Other    Uterine cancer Other    Social History   Tobacco Use   Smoking status: Never   Smokeless tobacco: Never  Vaping Use   Vaping Use: Never used  Substance Use Topics   Alcohol use: Yes    Alcohol/week: 4.0 standard drinks    Types: 4 Shots of liquor per week   Drug use: Yes     Types: Other-see comments    Comment: THC gummies   Current Outpatient Medications  Medication Sig Dispense Refill   albuterol (VENTOLIN HFA) 108 (90 Base) MCG/ACT inhaler Inhale 1 puff into the lungs every 6 (six) hours as needed.     Cannabinoids (THC FREE PO) Take 5-10 mg by mouth at bedtime.     cyanocobalamin (,VITAMIN B-12,) 1000 MCG/ML injection Inject 1 mg intramuscularly into skin once a week x 2 weeks and then monthly x 3 months. 5 mL 0   fexofenadine (ALLEGRA) 180 MG tablet Take 180 mg by mouth daily.     Fluticasone-Salmeterol (ADVAIR) 100-50 MCG/DOSE AEPB Inhale 1 puff into the lungs 2 (two) times daily as needed.     montelukast (SINGULAIR) 10 MG tablet Take 1 tablet (10 mg total) by mouth at bedtime. 90 tablet 3   Multiple Vitamin (MULTIVITAMIN) tablet Take 1 tablet by mouth. 1-2 times a week     SYRINGE-NEEDLE, DISP, 3 ML (B-D 3CC LUER-LOK SYR 25GX1") 25G X 1" 3 ML MISC Use to inject Vitamin B12 into skin. 5 each 0   traZODone (DESYREL) 100 MG tablet Take 1 tablet (100 mg total) by mouth at bedtime. 90 tablet 0   No current facility-administered medications for this visit.   Not on File   Review of Systems:  All other systems reviewed and negative except where noted in HPI.    PHYSICAL EXAM :    Wt Readings from Last 3 Encounters:  12/30/21 153 lb (69.4 kg)  11/18/21 151 lb 3.2 oz (68.6 kg)  07/26/21 161 lb 4 oz (73.1 kg)    BP 100/60    Pulse 77    Ht 5' 3"  (1.6 m)    Wt 153 lb (69.4 kg)    BMI 27.10 kg/m  Constitutional:  Generally well appearing female in no acute distress. Psychiatric: Pleasant. Normal mood and affect. Behavior is normal. EENT: Pupils normal.  Conjunctivae are normal. No scleral icterus. Neck supple.  Cardiovascular: Normal rate, regular rhythm. No edema Pulmonary/chest: Effort normal and breath sounds normal. No wheezing, rales or rhonchi. Abdominal: Soft, nondistended, nontender. Bowel sounds active throughout. There are no masses  palpable. No hepatomegaly. Neurological: Alert and oriented to person place and time. Skin: Skin is warm and dry. No rashes noted.  Tye Savoy, NP  12/30/2021, 11:25 AM  Cc:  Referring Provider [name], PA

## 2021-12-30 NOTE — Patient Instructions (Addendum)
If you are age 62 or older, your body mass index should be between 23-30. Your Body mass index is 27.1 kg/m. If this is out of the aforementioned range listed, please consider follow up with your Primary Care Provider.  If you are age 66 or younger, your body mass index should be between 19-25. Your Body mass index is 27.1 kg/m. If this is out of the aformentioned range listed, please consider follow up with your Primary Care Provider.   The [facility] providers would like to encourage you to use [facility] to communicate with providers for non-urgent requests or questions.  Due to long hold times on the telephone, sending your provider a message by [facility] may be a faster and more efficient way to get a response.  Please allow 48 business hours for a response.  Please remember that this is for non-urgent requests.   Hold oral iron for 10 days prior to EGD and colonoscopy.   You have been scheduled for an endoscopy and colonoscopy. Please follow the written instructions given to you at your visit today. Please pick up your prep supplies at the pharmacy within the next 1-3 days. If you use inhalers (even only as needed), please bring them with you on the day of your procedure.   It was a pleasure to see you today!  Thank you for trusting me with your gastrointestinal care!    Tye Savoy, NP

## 2021-12-30 NOTE — Progress Notes (Signed)
Addendum: Reviewed and agree with assessment and management plan. Azhane Eckart M, MD  

## 2022-01-02 ENCOUNTER — Other Ambulatory Visit: Payer: Self-pay

## 2022-01-02 MED ORDER — SUTAB 1479-225-188 MG PO TABS
ORAL_TABLET | ORAL | 0 refills | Status: DC
Start: 2022-01-02 — End: 2022-01-23

## 2022-01-03 ENCOUNTER — Other Ambulatory Visit: Payer: Self-pay

## 2022-01-03 ENCOUNTER — Encounter: Payer: Self-pay | Admitting: Pulmonary Disease

## 2022-01-03 ENCOUNTER — Ambulatory Visit: Payer: BC Managed Care – PPO | Admitting: Pulmonary Disease

## 2022-01-03 VITALS — BP 132/74 | HR 78 | Ht 63.0 in | Wt 154.0 lb

## 2022-01-03 DIAGNOSIS — J181 Lobar pneumonia, unspecified organism: Secondary | ICD-10-CM | POA: Diagnosis not present

## 2022-01-03 DIAGNOSIS — C189 Malignant neoplasm of colon, unspecified: Secondary | ICD-10-CM | POA: Insufficient documentation

## 2022-01-03 NOTE — Progress Notes (Signed)
Synopsis: Referred in February 2023 for abnormal chest imaging by [name], PA  Subjective:   PATIENT ID: Lindsey Knight GENDER: female DOB: [birth year], MRN: [id number]   HPI  Chief Complaint  Patient presents with   Consult    Referred by PCP for abnormal CT back in January 2023.    Lindsey Knight is a 61 year old woman, never smoker with history of asthma who is referred to pulmonary clinic for abnormal chest imaging.   Patient reports having respiratory illness in mid-December 2022 and was treated with Zpak on 12/13. PCP note from 11/08/2021 reviewed. Chest x-ray on 12/30 showed increased lung markings with mild right mid lung and left basilar linear scaring or atelectasis. She was also noted to have increased platelet count. She had CT Chest on 11/29/21 that showed mediastinal, axillary and hilar lymphadenopathy along with patchy ground-glass and adjacent focal clustered nodularity in the right upper lobe and adjacent band like atelectasis/scarring at the junction of the right upper lobe.   Patient reports resolution of her infectious symptoms from December and is feeling at her baseline. She will occasionally have some right sided discomfort but it is overall improved from December. She notices the pain when laying down or moving when she sleeps. She denies any shortness of breath, fevers, chills or night sweats. She denies hemoptysis. She does have joint aches in general which include her knees, hips and occasional hand stiffness in the mornings.  She has history of asthma and mainly has issues with reactive airways disease when triggered by viral infections and uses inhalers as needed. She denies issues with seasonal allergies. She had sinus surgery in the past with significant reduction in frequency of sinus infections.   She is a never smoker. She is a retired Marine scientist. She has a sister with crohn's disease and her mother had colon cancer.   Past Medical History:  Diagnosis Date    Asthma    Vaginal delivery    x 2, 1985, 1990     Family History  Problem Relation Age of Onset   Pancreatic cancer Other    Ovarian cancer Other    Uterine cancer Other      Social History   Socioeconomic History   Marital status: Married    Spouse name: Not on file   Number of children: Not on file   Years of education: Not on file   Highest education level: Not on file  Occupational History   Not on file  Tobacco Use   Smoking status: Never   Smokeless tobacco: Never  Vaping Use   Vaping Use: Never used  Substance and Sexual Activity   Alcohol use: Yes    Alcohol/week: 4.0 standard drinks    Types: 4 Shots of liquor per week   Drug use: Yes    Types: Other-see comments    Comment: THC gummies   Sexual activity: Not Currently  Other Topics Concern   Not on file  Social History Narrative   Retired Marine scientist   Married   Has two local daughters and grandchildren   Social Determinants of Health   Financial Resource Strain: Not on file  Food Insecurity: Not on file  Transportation Needs: Not on file  Physical Activity: Not on file  Stress: Not on file  Social Connections: Not on file  Intimate Partner Violence: Not on file     No Known Allergies   Outpatient Medications Prior to Visit  Medication Sig Dispense Refill   albuterol (  VENTOLIN HFA) 108 (90 Base) MCG/ACT inhaler Inhale 1 puff into the lungs every 6 (six) hours as needed.     Cannabinoids (THC FREE PO) Take 5-10 mg by mouth at bedtime.     cyanocobalamin (,VITAMIN B-12,) 1000 MCG/ML injection Inject 1 mg intramuscularly into skin once a week x 2 weeks and then monthly x 3 months. 5 mL 0   fexofenadine (ALLEGRA) 180 MG tablet Take 180 mg by mouth daily.     Fluticasone-Salmeterol (ADVAIR) 100-50 MCG/DOSE AEPB Inhale 1 puff into the lungs 2 (two) times daily as needed.     montelukast (SINGULAIR) 10 MG tablet Take 1 tablet (10 mg total) by mouth at bedtime. 90 tablet 3   Multiple Vitamin (MULTIVITAMIN)  tablet Take 1 tablet by mouth. 1-2 times a week     Sodium Sulfate-Mag Sulfate-KCl (SUTAB) [phone] MG TABS Following MD instructions take as 2 separate doses 24 tablet 0   SYRINGE-NEEDLE, DISP, 3 ML (B-D 3CC LUER-LOK SYR 25GX1") 25G X 1" 3 ML MISC Use to inject Vitamin B12 into skin. 5 each 0   traZODone (DESYREL) 100 MG tablet Take 1 tablet (100 mg total) by mouth at bedtime. 90 tablet 0   No facility-administered medications prior to visit.    Review of Systems  Constitutional:  Negative for chills, fever, malaise/fatigue and weight loss.  HENT:  Negative for congestion, sinus pain and sore throat.   Eyes: Negative.   Respiratory:  Negative for cough, hemoptysis, sputum production, shortness of breath and wheezing.   Cardiovascular:  Negative for chest pain, palpitations, orthopnea, claudication and leg swelling.  Gastrointestinal:  Negative for abdominal pain, heartburn, nausea and vomiting.  Genitourinary: Negative.   Musculoskeletal:  Positive for joint pain. Negative for myalgias.  Skin:  Negative for rash.  Neurological:  Negative for weakness.  Endo/Heme/Allergies: Negative.   Psychiatric/Behavioral: Negative.     Objective:   Vitals:   01/03/22 0856  BP: 132/74  Pulse: 78  SpO2: 99%  Weight: 154 lb (69.9 kg)  Height: 5' 3"  (1.6 m)   Physical Exam Constitutional:      General: She is not in acute distress.    Appearance: She is not ill-appearing.  HENT:     Head: Normocephalic and atraumatic.  Eyes:     General: No scleral icterus.    Conjunctiva/sclera: Conjunctivae normal.     Pupils: Pupils are equal, round, and reactive to light.  Cardiovascular:     Rate and Rhythm: Normal rate and regular rhythm.     Pulses: Normal pulses.     Heart sounds: Normal heart sounds. No murmur heard. Pulmonary:     Effort: Pulmonary effort is normal.     Breath sounds: Normal breath sounds. No wheezing, rhonchi or rales.  Abdominal:     General: Bowel sounds are normal.      Palpations: Abdomen is soft.  Musculoskeletal:     Right lower leg: No edema.     Left lower leg: No edema.  Lymphadenopathy:     Cervical: No cervical adenopathy.  Skin:    General: Skin is warm and dry.  Neurological:     General: No focal deficit present.     Mental Status: She is alert.  Psychiatric:        Mood and Affect: Mood normal.        Behavior: Behavior normal.        Thought Content: Thought content normal.        Judgment: Judgment normal.  CBC    Component Value Date/Time   WBC 5.0 12/08/2021 1124   RBC 4.75 12/08/2021 1124   HGB 11.2 (L) 12/08/2021 1124   HCT 35.2 (L) 12/08/2021 1124   PLT 361.0 12/08/2021 1124   MCV 74.1 (L) 12/08/2021 1124   MCHC 31.8 12/08/2021 1124   RDW 15.0 12/08/2021 1124   LYMPHSABS 1.7 12/08/2021 1124   MONOABS 0.5 12/08/2021 1124   EOSABS 0.3 12/08/2021 1124   BASOSABS 0.0 12/08/2021 1124   BMP Latest Ref Rng & Units 11/18/2021 01/21/2021  Glucose 70 - 99 mg/dL 112(H) 121(H)  BUN 6 - 23 mg/dL 11 10  Creatinine 0.40 - 1.20 mg/dL 0.80 0.76  Sodium 135 - 145 mEq/L 136 134(L)  Potassium 3.5 - 5.1 mEq/L 3.8 4.0  Chloride 96 - 112 mEq/L 103 101  CO2 19 - 32 mEq/L 26 23  Calcium 8.4 - 10.5 mg/dL 9.2 9.2   Chest imaging: CT Chest 11/29/21 Patchy ground-glass and adjacent focal clustered nodularity in the right upper lobe and adjacent band like atelectasis/scarring at the junction of the right upper middle lobes. This is consistent with an infectious/inflammatory process. Recommend follow-up chest CT in 3 months to ensure resolution.   Multiple prominent mediastinal and axillary lymph nodes with an enlarged right hilar lymph node and left axillary lymph node. These are favored to be reactive, attention recommended on follow-up CT.   Additional 5 mm ground-glass nodular opacity in the left upper lobe, which may or may not be related to the aforementioned infectious/inflammatory process. This can be reassessed on  follow-up CT.  CXR 11/18/21 Chronic appearing increased lung markings with mild mid right lung and left basilar linear scarring and/or atelectasis.  PFT: No flowsheet data found.  Labs:  Path: Peripheral Smear 11/18/21 WBC are unremarkable. The overall findings in this smear are consistent with a reactive thrombocytosis. Clinical correlation is recommended. Anemia with RBCs which appear to be microcytic and hypochromic on smear review. Suggest evaluation for iron deficiency and/or a source of blood loss, if clinically indicated.   Echo:  Heart Catheterization:    Assessment & Plan:   Lobar pneumonia, unspecified organism ([place]) - Plan: DG Chest 2 View  Discussion: Lindsey Knight is a 61 year old woman, never smoker with history of asthma who is referred to pulmonary clinic for abnormal chest imaging.   Her chest imaging appears related to her recent respiratory infection that she experienced in December when traveling to [place] to care for her parents. Her symptoms are improved at this time except for ongoing right sided discomfort possibly secondary to trauma she experienced from a fall.  We discussed repeating chest imaging to ensure that these findings resolve. She would like to have a repeat chest radiograph before having another CT chest if absolutely needed.  Follow up in 2 months with chest x-ray.  Freda Jackson, MD [facility] Office: [phone]    Current Outpatient Medications:    albuterol (VENTOLIN HFA) 108 (90 Base) MCG/ACT inhaler, Inhale 1 puff into the lungs every 6 (six) hours as needed., Disp: , Rfl:    Cannabinoids (THC FREE PO), Take 5-10 mg by mouth at bedtime., Disp: , Rfl:    cyanocobalamin (,VITAMIN B-12,) 1000 MCG/ML injection, Inject 1 mg intramuscularly into skin once a week x 2 weeks and then monthly x 3 months., Disp: 5 mL, Rfl: 0   fexofenadine (ALLEGRA) 180 MG tablet, Take 180 mg by mouth daily., Disp: , Rfl:  Fluticasone-Salmeterol (ADVAIR) 100-50 MCG/DOSE AEPB, Inhale 1 puff into the lungs 2 (two) times daily as needed., Disp: , Rfl:    montelukast (SINGULAIR) 10 MG tablet, Take 1 tablet (10 mg total) by mouth at bedtime., Disp: 90 tablet, Rfl: 3   Multiple Vitamin (MULTIVITAMIN) tablet, Take 1 tablet by mouth. 1-2 times a week, Disp: , Rfl:    Sodium Sulfate-Mag Sulfate-KCl (SUTAB) [phone] MG TABS, Following MD instructions take as 2 separate doses, Disp: 24 tablet, Rfl: 0   SYRINGE-NEEDLE, DISP, 3 ML (B-D 3CC LUER-LOK SYR 25GX1") 25G X 1" 3 ML MISC, Use to inject Vitamin B12 into skin., Disp: 5 each, Rfl: 0   traZODone (DESYREL) 100 MG tablet, Take 1 tablet (100 mg total) by mouth at bedtime., Disp: 90 tablet, Rfl: 0

## 2022-01-03 NOTE — Patient Instructions (Signed)
You history and chest imaging is concerning for a pneumonia.   We will repeat a chest x-ray at follow up visit in 2 months.

## 2022-01-10 ENCOUNTER — Encounter: Payer: Self-pay | Admitting: Pulmonary Disease

## 2022-01-16 ENCOUNTER — Encounter: Payer: Self-pay | Admitting: Internal Medicine

## 2022-01-22 ENCOUNTER — Encounter: Payer: Self-pay | Admitting: Certified Registered Nurse Anesthetist

## 2022-01-23 ENCOUNTER — Ambulatory Visit (AMBULATORY_SURGERY_CENTER): Payer: BC Managed Care – PPO | Admitting: Internal Medicine

## 2022-01-23 ENCOUNTER — Other Ambulatory Visit (INDEPENDENT_AMBULATORY_CARE_PROVIDER_SITE_OTHER): Payer: BC Managed Care – PPO

## 2022-01-23 ENCOUNTER — Other Ambulatory Visit: Payer: Self-pay

## 2022-01-23 ENCOUNTER — Encounter: Payer: Self-pay | Admitting: Internal Medicine

## 2022-01-23 VITALS — BP 124/67 | HR 73 | Temp 99.1°F | Resp 16 | Ht 62.0 in | Wt 153.0 lb

## 2022-01-23 DIAGNOSIS — K319 Disease of stomach and duodenum, unspecified: Secondary | ICD-10-CM | POA: Diagnosis not present

## 2022-01-23 DIAGNOSIS — K2980 Duodenitis without bleeding: Secondary | ICD-10-CM | POA: Diagnosis not present

## 2022-01-23 DIAGNOSIS — D509 Iron deficiency anemia, unspecified: Secondary | ICD-10-CM | POA: Diagnosis not present

## 2022-01-23 DIAGNOSIS — K449 Diaphragmatic hernia without obstruction or gangrene: Secondary | ICD-10-CM | POA: Diagnosis not present

## 2022-01-23 DIAGNOSIS — Z800 Family history of malignant neoplasm of digestive organs: Secondary | ICD-10-CM | POA: Diagnosis not present

## 2022-01-23 HISTORY — PX: COLONOSCOPY: SHX174

## 2022-01-23 LAB — IBC + FERRITIN
Ferritin: 25.8 ng/mL (ref 10.0–291.0)
Iron: 23 ug/dL — ABNORMAL LOW (ref 42–145)
Saturation Ratios: 7.8 % — ABNORMAL LOW (ref 20.0–50.0)
TIBC: 295.4 ug/dL (ref 250.0–450.0)
Transferrin: 211 mg/dL — ABNORMAL LOW (ref 212.0–360.0)

## 2022-01-23 LAB — CBC
HCT: 37.6 % (ref 36.0–46.0)
Hemoglobin: 12.4 g/dL (ref 12.0–15.0)
MCHC: 33 g/dL (ref 30.0–36.0)
MCV: 79 fl (ref 78.0–100.0)
Platelets: 345 K/uL (ref 150.0–400.0)
RBC: 4.76 Mil/uL (ref 3.87–5.11)
RDW: 20.3 % — ABNORMAL HIGH (ref 11.5–15.5)
WBC: 7 K/uL (ref 4.0–10.5)

## 2022-01-23 MED ORDER — SODIUM CHLORIDE 0.9 % IV SOLN: 500.0000 mL | Freq: Once | INTRAVENOUS | Status: DC

## 2022-01-23 NOTE — Op Note (Signed)
[facility] ?Patient Name: Lindsey Knight ?Procedure Date: 01/23/2022 3:00 PM ?MRN: [id number] ?Endoscopist: Jerene Bears , MD ?Age: 61 ?Referring MD:  ?Date of Birth: 1961/08/29 ?Gender: Female ?Account #: [id number] ?Procedure:                Colonoscopy ?Indications:              Iron deficiency anemia, Family history of colon  ?                          cancer in a first-degree relative after age 64  ?                          years, last colonoscopy 10+ years ago ?Medicines:                Monitored Anesthesia Care ?Procedure:                Pre-Anesthesia Assessment: ?                          - Prior to the procedure, a History and Physical  ?                          was performed, and patient medications and  ?                          allergies were reviewed. The patient's tolerance of  ?                          previous anesthesia was also reviewed. The risks  ?                          and benefits of the procedure and the sedation  ?                          options and risks were discussed with the patient.  ?                          All questions were answered, and informed consent  ?                          was obtained. Prior Anticoagulants: The patient has  ?                          taken no previous anticoagulant or antiplatelet  ?                          agents. ASA Grade Assessment: II - A patient with  ?                          mild systemic disease. After reviewing the risks  ?                          and benefits, the patient was deemed in  ?  satisfactory condition to undergo the procedure. ?                          After obtaining informed consent, the colonoscope  ?                          was passed under direct vision. Throughout the  ?                          procedure, the patient's blood pressure, pulse, and  ?                          oxygen saturations were monitored continuously. The  ?                          Olympus PCF-H190DL (#[id number])  Colonoscope was  ?                          introduced through the anus and advanced to the  ?                          cecum, identified by appendiceal orifice and  ?                          ileocecal valve. The colonoscopy was performed  ?                          without difficulty. The patient tolerated the  ?                          procedure well. The quality of the bowel  ?                          preparation was good. The ileocecal valve,  ?                          appendiceal orifice, and rectum were photographed. ?Scope In: 3:27:53 PM ?Scope Out: 3:44:01 PM ?Scope Withdrawal Time: 0 hours 12 minutes 58 seconds  ?Total Procedure Duration: 0 hours 16 minutes 8 seconds  ?Findings:                 The digital rectal exam was normal. ?                          The colon (entire examined portion) appeared normal. ?                          Internal hemorrhoids were found during  ?                          retroflexion. The hemorrhoids were small. ?Complications:            No immediate complications. ?Estimated Blood Loss:     Estimated blood loss: none. ?Impression:               - The entire examined colon is  normal. ?                          - Small internal hemorrhoids. ?                          - No specimens collected. ?Recommendation:           - Patient has a contact number available for  ?                          emergencies. The signs and symptoms of potential  ?                          delayed complications were discussed with the  ?                          patient. Return to normal activities tomorrow.  ?                          Written discharge instructions were provided to the  ?                          patient. ?                          - Resume previous diet. ?                          - Continue present medications. ?                          - Repeat colonoscopy in 5 years for screening  ?                          purposes. ?                          - To visualize the small bowel  and complete GI  ?                          evaluation of iron deficiency, perform video  ?                          capsule endoscopy at appointment to be scheduled. ?Jerene Bears, MD ?01/23/2022 3:48:51 PM ?This report has been signed electronically. ?

## 2022-01-23 NOTE — Progress Notes (Signed)
1514 Robinul 0.1 mg IV given due large amount of secretions upon assessment.  MD made aware, vss  ?

## 2022-01-23 NOTE — Progress Notes (Signed)
Patient presenting for EGD and colonoscopy today ?Seen in the office on 12/30/2021 by Tye Savoy see that note for details ?She remains appropriate for upper and lower endoscopy in the [facility] today to evaluate iron deficiency anemia ?

## 2022-01-23 NOTE — Progress Notes (Signed)
1542 Pt wheezing with sat> 90% on 3 L per [place]. Albuterol neb given, MD aware. vss ?

## 2022-01-23 NOTE — Patient Instructions (Signed)
Discharge instructions given. ?Biopsies taken. ?Handout on Hemorrhoids and Hiatal Hernia. ?Resume previous medications. ?Office will call to schedule appointment. ?YOU HAD AN ENDOSCOPIC PROCEDURE TODAY AT [facility]:   Refer to the procedure report that was given to you for any specific questions about what was found during the examination.  If the procedure report does not answer your questions, please call your gastroenterologist to clarify.  If you requested that your care partner not be given the details of your procedure findings, then the procedure report has been included in a sealed envelope for you to review at your convenience later. ? ?YOU SHOULD EXPECT: Some feelings of bloating in the abdomen. Passage of more gas than usual.  Walking can help get rid of the air that was put into your GI tract during the procedure and reduce the bloating. If you had a lower endoscopy (such as a colonoscopy or flexible sigmoidoscopy) you may notice spotting of blood in your stool or on the toilet paper. If you underwent a bowel prep for your procedure, you may not have a normal bowel movement for a few days. ? ?Please Note:  You might notice some irritation and congestion in your nose or some drainage.  This is from the oxygen used during your procedure.  There is no need for concern and it should clear up in a day or so. ? ?SYMPTOMS TO REPORT IMMEDIATELY: ? ?Following lower endoscopy (colonoscopy or flexible sigmoidoscopy): ? Excessive amounts of blood in the stool ? Significant tenderness or worsening of abdominal pains ? Swelling of the abdomen that is new, acute ? Fever of 100?F or higher ? ?Following upper endoscopy (EGD) ? Vomiting of blood or coffee ground material ? New chest pain or pain under the shoulder blades ? Painful or persistently difficult swallowing ? New shortness of breath ? Fever of 100?F or higher ? Black, tarry-looking stools ? ?For urgent or emergent issues, a gastroenterologist  can be reached at any hour by calling [phone]. ?Do not use MyChart messaging for urgent concerns.  ? ? ?DIET:  We do recommend a small meal at first, but then you may proceed to your regular diet.  Drink plenty of fluids but you should avoid alcoholic beverages for 24 hours. ? ?ACTIVITY:  You should plan to take it easy for the rest of today and you should NOT DRIVE or use heavy machinery until tomorrow (because of the sedation medicines used during the test).   ? ?FOLLOW UP: ?Our staff will call the number listed on your records 48-72 hours following your procedure to check on you and address any questions or concerns that you may have regarding the information given to you following your procedure. If we do not reach you, we will leave a message.  We will attempt to reach you two times.  During this call, we will ask if you have developed any symptoms of COVID 19. If you develop any symptoms (ie: fever, flu-like symptoms, shortness of breath, cough etc.) before then, please call [phone].  If you test positive for Covid 19 in the 2 weeks post procedure, please call and report this information to [place].   ? ?If any biopsies were taken you will be contacted by phone or by letter within the next 1-3 weeks.  Please call us at [phone] if you have not heard about the biopsies in 3 weeks.  ? ? ?SIGNATURES/CONFIDENTIALITY: ?You and/or your care partner have signed paperwork which will be entered  into your electronic medical record.  These signatures attest to the fact that that the information above on your After Visit Summary has been reviewed and is understood.  Full responsibility of the confidentiality of this discharge information lies with you and/or your care-partner.  ?

## 2022-01-23 NOTE — Progress Notes (Signed)
1522 Pt experienced laryngeal spasm with jaw thrust performed. Vss. ?

## 2022-01-23 NOTE — Op Note (Signed)
[facility] ?Patient Name: Lindsey Knight ?Procedure Date: 01/23/2022 3:01 PM ?MRN: [id number] ?Endoscopist: Jerene Bears , MD ?Age: 61 ?Referring MD:  ?Date of Birth: 1961/10/08 ?Gender: Female ?Account #: [id number] ?Procedure:                Upper GI endoscopy ?Indications:              Iron deficiency anemia ?Medicines:                Monitored Anesthesia Care ?Procedure:                Pre-Anesthesia Assessment: ?                          - Prior to the procedure, a History and Physical  ?                          was performed, and patient medications and  ?                          allergies were reviewed. The patient's tolerance of  ?                          previous anesthesia was also reviewed. The risks  ?                          and benefits of the procedure and the sedation  ?                          options and risks were discussed with the patient.  ?                          All questions were answered, and informed consent  ?                          was obtained. Prior Anticoagulants: The patient has  ?                          taken no previous anticoagulant or antiplatelet  ?                          agents. ASA Grade Assessment: II - A patient with  ?                          mild systemic disease. After reviewing the risks  ?                          and benefits, the patient was deemed in  ?                          satisfactory condition to undergo the procedure. ?                          After obtaining informed consent, the endoscope was  ?  passed under direct vision. Throughout the  ?                          procedure, the patient's blood pressure, pulse, and  ?                          oxygen saturations were monitored continuously. The  ?                          Endoscope was introduced through the mouth, and  ?                          advanced to the second part of duodenum. The upper  ?                          GI endoscopy was accomplished without  difficulty.  ?                          The patient tolerated the procedure well. ?Scope In: ?Scope Out: ?Findings:                 The examined esophagus was normal. ?                          A 2 cm hiatal hernia was present. ?                          The entire examined stomach was normal. Biopsies  ?                          were taken with a cold forceps for histology and  ?                          Helicobacter pylori testing. ?                          The examined duodenum was normal. Biopsies for  ?                          histology were taken with a cold forceps for  ?                          evaluation of celiac disease. ?Complications:            No immediate complications. ?Estimated Blood Loss:     Estimated blood loss was minimal. ?Impression:               - Normal esophagus. ?                          - 2 cm hiatal hernia. ?                          - Normal stomach. Biopsied. ?                          -  Normal examined duodenum. Biopsied. ?Recommendation:           - Patient has a contact number available for  ?                          emergencies. The signs and symptoms of potential  ?                          delayed complications were discussed with the  ?                          patient. Return to normal activities tomorrow.  ?                          Written discharge instructions were provided to the  ?                          patient. ?                          - Resume previous diet. ?                          - Continue present medications. ?                          - Await pathology results. ?                          - See the other procedure note for documentation of  ?                          additional recommendations. ?Jerene Bears, MD ?01/23/2022 3:46:38 PM ?This report has been signed electronically. ?

## 2022-01-23 NOTE — Progress Notes (Signed)
Called to room to assist during endoscopic procedure.  Patient ID and intended procedure confirmed with present staff. Received instructions for my participation in the procedure from the performing physician.  

## 2022-01-23 NOTE — Progress Notes (Signed)
Report given to PACU, vss 

## 2022-01-24 ENCOUNTER — Other Ambulatory Visit: Payer: Self-pay

## 2022-01-24 DIAGNOSIS — D509 Iron deficiency anemia, unspecified: Secondary | ICD-10-CM

## 2022-01-25 ENCOUNTER — Telehealth: Payer: Self-pay

## 2022-01-25 NOTE — Telephone Encounter (Signed)
?  Follow up Call- ? ?Call back number 01/23/2022  ?Post procedure Call Back phone  # [phone]  ?Permission to leave phone message Yes  ?  ? ?Patient questions: ? ?Do you have a fever, pain , or abdominal swelling? No. ?Pain Score  0 * ? ?Have you tolerated food without any problems? Yes.   ? ?Have you been able to return to your normal activities? Yes.   ? ?Do you have any questions about your discharge instructions: ?Diet   No. ?Medications  No. ?Follow up visit  No. ? ?Do you have questions or concerns about your Care? No. ? ?Actions: ?* If pain score is 4 or above: ?No action needed, pain <4. ? ? ?

## 2022-01-26 ENCOUNTER — Encounter: Payer: Self-pay | Admitting: Internal Medicine

## 2022-01-26 ENCOUNTER — Other Ambulatory Visit: Payer: Self-pay

## 2022-01-26 DIAGNOSIS — D509 Iron deficiency anemia, unspecified: Secondary | ICD-10-CM

## 2022-02-06 ENCOUNTER — Telehealth: Payer: Self-pay | Admitting: Internal Medicine

## 2022-02-07 ENCOUNTER — Other Ambulatory Visit: Payer: Self-pay

## 2022-02-07 ENCOUNTER — Telehealth: Payer: Self-pay | Admitting: Internal Medicine

## 2022-02-07 ENCOUNTER — Telehealth: Payer: Self-pay

## 2022-02-07 ENCOUNTER — Ambulatory Visit (INDEPENDENT_AMBULATORY_CARE_PROVIDER_SITE_OTHER): Payer: BC Managed Care – PPO | Admitting: Internal Medicine

## 2022-02-07 DIAGNOSIS — Z719 Counseling, unspecified: Secondary | ICD-10-CM

## 2022-02-07 DIAGNOSIS — D509 Iron deficiency anemia, unspecified: Secondary | ICD-10-CM

## 2022-02-07 MED ORDER — PANTOPRAZOLE SODIUM 40 MG PO TBEC: 40.0000 mg | DELAYED_RELEASE_TABLET | Freq: Every day | ORAL | 3 refills | Status: DC

## 2022-02-07 NOTE — Telephone Encounter (Signed)
Unfortunate to learn of this on the day of her scheduled capsule endoscopy ?Would let her know with my apologies that this should likely be delayed until June or after; would repeat ferritin plus IBC and CBC in early June and then we can decide if VCE continues to be recommended ? ?

## 2022-02-07 NOTE — Telephone Encounter (Signed)
Patient arrived today for capsule endoscopy.  Unfortunately, her insurance will not approve until she has 6 months documentation of iron deficiency.  Attempted a peer to peer with the insurance company, but that was denied without 6 months documentation of iron deficiency.   ?Patient and Dr. Hilarie Fredrickson notified of the outcome of call to insurance.  Patient also reports today she is having daily reflux and is taking TUMS PRN with little to no relief.  Discussed with Dr. Hilarie Fredrickson, rx for Pantoprazole 40 mg 1 po qd sent to her pharmacy.  She will need repeat CBC, IBC, + ferritin in June to re-evaluate her iron def.   ? ?Rx sent and labs entered.  ?

## 2022-02-07 NOTE — Progress Notes (Signed)
Capsule not completed today d/t not meeting requirements with insurance showing 6 months of IDA. Insurance unable to authorize resulting in need to cancel. Dr. Hilarie Fredrickson made aware. ?

## 2022-02-07 NOTE — Patient Instructions (Addendum)
Capsule not completed today d/t not meeting requirements with insurance showing 6 months of IDA. Insurance unable to authorize resulting in need to cancel. Dr. Hilarie Fredrickson made aware. ?

## 2022-02-07 NOTE — Telephone Encounter (Signed)
Hey Dr Hilarie Fredrickson this pt's capsule endoscopy is not able to be approved by the pt's insurance since they state the pt would need to have persistent iron deficiency anemia for 6 months or more. I contacted the pt and confirmed she's only known about this since January of this year, pt does not wish to proceed with the procedure at this time if no authorization is obtained.  ?

## 2022-02-10 ENCOUNTER — Other Ambulatory Visit: Payer: Self-pay | Admitting: Internal Medicine

## 2022-02-10 MED ORDER — PANTOPRAZOLE SODIUM 40 MG PO TBEC: 40.0000 mg | DELAYED_RELEASE_TABLET | Freq: Every day | ORAL | 11 refills | Status: DC

## 2022-02-10 NOTE — Telephone Encounter (Signed)
I have spoken to patient to advise that unfortunately, her insurance has denied pantoprazole. I have given her the option of trying to send a different medication to see if it will be covered or having her use a different pharmacy which pantoprazole will be more affordable using goodrx. She would like rx sent to [facility]. Rx sent. ?

## 2022-02-14 ENCOUNTER — Encounter: Payer: Self-pay | Admitting: Internal Medicine

## 2022-02-16 ENCOUNTER — Other Ambulatory Visit: Payer: Self-pay | Admitting: Family Medicine

## 2022-03-06 ENCOUNTER — Ambulatory Visit (INDEPENDENT_AMBULATORY_CARE_PROVIDER_SITE_OTHER): Payer: BC Managed Care – PPO

## 2022-03-06 ENCOUNTER — Ambulatory Visit: Payer: BC Managed Care – PPO | Admitting: Pulmonary Disease

## 2022-03-06 ENCOUNTER — Encounter: Payer: Self-pay | Admitting: Pulmonary Disease

## 2022-03-06 VITALS — BP 120/62 | HR 68 | Ht 63.0 in | Wt 159.2 lb

## 2022-03-06 DIAGNOSIS — J181 Lobar pneumonia, unspecified organism: Secondary | ICD-10-CM | POA: Diagnosis not present

## 2022-03-06 DIAGNOSIS — J4520 Mild intermittent asthma, uncomplicated: Secondary | ICD-10-CM

## 2022-03-06 DIAGNOSIS — R918 Other nonspecific abnormal finding of lung field: Secondary | ICD-10-CM | POA: Diagnosis not present

## 2022-03-06 DIAGNOSIS — J189 Pneumonia, unspecified organism: Secondary | ICD-10-CM | POA: Diagnosis not present

## 2022-03-06 NOTE — Patient Instructions (Addendum)
Continue advair 100-50mg 1 puff twice daily as needed ?- rinse mouth out after each use ? ?Use albuterol as needed 1-2 puffs every 4-6 hours ? ?Continue singulair daily ? ?Continue allegra daily ? ?We will contact you once the radiology report is in for your chest x-ray. If abnormal, we should consider doing another CT Chest scan. ? ?Follow up in 1 year ? ? ?

## 2022-03-06 NOTE — Progress Notes (Signed)
? ?Synopsis: Referred in February 2023 for abnormal chest imaging by Lindsey Coke, PA ? ?Subjective:  ? ?PATIENT ID: Lindsey Knight GENDER: female DOB: 1961-03-31, MRN: [id number] ? ?HPI ? ?Chief Complaint  ?Patient presents with  ? Follow-up  ?  71mof/u. States her breathing has been doing well since last visit.   ? ?MNazariah Cadetis a 61year old woman, never smoker with history of asthma who returns to pulmonary clinic for abnormal chest imaging.  ? ?Chest x-ray today shows clearing of the right upper lobe infiltrates. Will await final radiology read.  ? ?She is using advair 100-51m 1 puff twice daily as needed along with daily allegra and montelukast for her asthma. No night time awakenings. No issues with spring allergies. ? ?She has been feeling well since last visit. He cough has significantly decreased. She recently had EGD and C-scope and started on PPI therapy for GERD.  ? ?OV 01/03/22 ?Patient reports having respiratory illness in mid-December 2022 and was treated with Zpak on 12/13. PCP note from 11/08/2021 reviewed. Chest x-ray on 12/30 showed increased lung markings with mild right mid lung and left basilar linear scaring or atelectasis. She was also noted to have increased platelet count. She had CT Chest on 11/29/21 that showed mediastinal, axillary and hilar lymphadenopathy along with patchy ground-glass and adjacent focal clustered nodularity in the right upper lobe and adjacent band like atelectasis/scarring at the junction of the right upper lobe.  ? ?Patient reports resolution of her infectious symptoms from December and is feeling at her baseline. She will occasionally have some right sided discomfort but it is overall improved from December. She notices the pain when laying down or moving when she sleeps. She denies any shortness of breath, fevers, chills or night sweats. She denies hemoptysis. She does have joint aches in general which include her knees, hips and occasional hand stiffness in the  mornings. ? ?She has history of asthma and mainly has issues with reactive airways disease when triggered by viral infections and uses inhalers as needed. She denies issues with seasonal allergies. She had sinus surgery in the past with significant reduction in frequency of sinus infections.  ? ?She is a never smoker. 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  ? ?Past Medical History:  ?Diagnosis Date  ? Anemia   ? Asthma   ? Vaginal delivery   ? x 2, 1985, 1990  ?  ? ?Family History  ?Problem Relation Age of Onset  ? Colon cancer Mother   ? Crohn's disease Sister   ? Pancreatic cancer Other   ? Ovarian cancer Other   ? Uterine cancer Other   ? Esophageal cancer Neg Hx   ? Rectal cancer Neg Hx   ? Stomach cancer Neg Hx   ?  ? ?Social History  ? ?Socioeconomic History  ? Marital status: Married  ?  Spouse name: Not on file  ? Number of children: Not on file  ? Years of education: Not on file  ? Highest education level: Not on file  ?Occupational History  ? Not on file  ?Tobacco Use  ? Smoking status: Never  ?  Passive exposure: Never  ? Smokeless tobacco: Never  ?Vaping Use  ? Vaping Use: Never used  ?Substance and Sexual Activity  ? Alcohol use: Yes  ?  Alcohol/week: 4.0 standard drinks  ?  Types: 4 Shots of liquor per week  ?  Drug use: Yes  ?  Types: Other-see comments  ?  Comment: THC gummies  ? Sexual activity: Not Currently  ?Other Topics Concern  ? Not on file  ?Social History Narrative  ? Retired Marine scientist  ? Married  ? Has two local daughters and grandchildren  ? ?Social Determinants of Health  ? ?Financial Resource Strain: Not on file  ?Food Insecurity: Not on file  ?Transportation Needs: Not on file  ?Physical Activity: Not on file  ?Stress: Not on file  ?Social Connections: Not on file  ?Intimate Partner Violence: Not on file  ?  ? ?Allergies  ?Allergen Reactions  ? Tape Rash  ?  ? ?Outpatient Medications Prior to Visit  ?Medication Sig Dispense Refill  ?  albuterol (VENTOLIN HFA) 108 (90 Base) MCG/ACT inhaler Inhale 1 puff into the lungs every 6 (six) hours as needed.    ? Ascorbic Acid (VITAMIN C CR) 500 MG TBCR Take by mouth.    ? cyanocobalamin (,VITAMIN B-12,) 1000 MCG/ML injection Inject 1 mg intramuscularly into skin once a week x 2 weeks and then monthly x 3 months. 5 mL 0  ? ferrous sulfate 324 MG TBEC Take 324 mg by mouth.    ? fexofenadine (ALLEGRA) 180 MG tablet Take 180 mg by mouth daily.    ? Fluticasone-Salmeterol (ADVAIR) 100-50 MCG/DOSE AEPB Inhale 1 puff into the lungs 2 (two) times daily as needed.    ? Melatonin 5 MG CHEW Chew by mouth.    ? montelukast (SINGULAIR) 10 MG tablet Take 1 tablet (10 mg total) by mouth at bedtime. 90 tablet 3  ? Multiple Vitamin (MULTIVITAMIN) tablet Take 1 tablet by mouth. 1-2 times a week    ? pantoprazole (PROTONIX) 40 MG tablet Take 1 tablet (40 mg total) by mouth daily. 30 tablet 11  ? SYRINGE-NEEDLE, DISP, 3 ML (B-D 3CC LUER-LOK SYR 25GX1") 25G X 1" 3 ML MISC Use to inject Vitamin B12 into skin. 5 each 0  ? traZODone (DESYREL) 100 MG tablet TAKE 1 TABLET BY MOUTH EVERYDAY AT BEDTIME 90 tablet 0  ? pantoprazole (PROTONIX) 40 MG tablet Take 1 tablet (40 mg total) by mouth daily. 90 tablet 3  ? ?Facility-Administered Medications Prior to Visit  ?Medication Dose Route Frequency Provider Last Rate Last Admin  ? 0.9 %  sodium chloride infusion  500 mL Intravenous Once Pyrtle, Lajuan Lines, MD      ? ? ?Review of Systems  ?Constitutional:  Negative for chills, fever, malaise/fatigue and weight loss.  ?HENT:  Negative for congestion, sinus pain and sore throat.   ?Eyes: Negative.   ?Respiratory:  Negative for cough, hemoptysis, sputum production, shortness of breath and wheezing.   ?Cardiovascular:  Negative for chest pain, palpitations, orthopnea, claudication and leg swelling.  ?Gastrointestinal:  Negative for abdominal pain, heartburn, nausea and vomiting.  ?Genitourinary: Negative.   ?Musculoskeletal:  Negative for myalgias.   ?Skin:  Negative for rash.  ?Neurological:  Negative for weakness.  ?Endo/Heme/Allergies: Negative.   ?Psychiatric/Behavioral: Negative.    ? ?Objective:  ? ?Vitals:  ? 03/06/22 0941  ?BP: 120/62  ?Pulse: 68  ?SpO2: 97%  ?Weight: 159 lb 3.2 oz (72.2 kg)  ?Height: 5' 3"  (1.6 m)  ? ?Physical Exam ?Constitutional:   ?   General: She is not in acute distress. ?   Appearance: She is not ill-appearing.  ?HENT:  ?   Head: Normocephalic and atraumatic.  ?Eyes:  ?   General: No scleral icterus. ?   Conjunctiva/sclera: Conjunctivae  normal.  ?Cardiovascular:  ?   Rate and Rhythm: Normal rate and regular rhythm.  ?   Pulses: Normal pulses.  ?   Heart sounds: Normal heart sounds. No murmur heard. ?Pulmonary:  ?   Effort: Pulmonary effort is normal.  ?   Breath sounds: Normal breath sounds. No wheezing, rhonchi or rales.  ?Musculoskeletal:  ?   Right lower leg: No edema.  ?   Left lower leg: No edema.  ?Lymphadenopathy:  ?   Cervical: No cervical adenopathy.  ?Skin: ?   General: Skin is warm and dry.  ?Neurological:  ?   General: No focal deficit present.  ?   Mental Status: She is alert.  ?Psychiatric:     ?   Mood and Affect: Mood normal.     ?   Behavior: Behavior normal.     ?   Thought Content: Thought content normal.     ?   Judgment: Judgment normal.  ? ?CBC ?   ?Component Value Date/Time  ? WBC 7.0 01/23/2022 1628  ? RBC 4.76 01/23/2022 1628  ? HGB 12.4 01/23/2022 1628  ? HCT 37.6 01/23/2022 1628  ? PLT 345.0 01/23/2022 1628  ? MCV 79.0 01/23/2022 1628  ? MCHC 33.0 01/23/2022 1628  ? RDW 20.3 (H) 01/23/2022 1628  ? LYMPHSABS 1.7 12/08/2021 1124  ? MONOABS 0.5 12/08/2021 1124  ? EOSABS 0.3 12/08/2021 1124  ? BASOSABS 0.0 12/08/2021 1124  ? ? ?  Latest Ref Rng & Units 11/18/2021  ?  1:02 PM 01/21/2021  ?  2:09 PM  ?BMP  ?Glucose 70 - 99 mg/dL 112   121    ?BUN 6 - 23 mg/dL 11   10    ?Creatinine 0.40 - 1.20 mg/dL 0.80   0.76    ?Sodium 135 - 145 mEq/L 136   134    ?Potassium 3.5 - 5.1 mEq/L 3.8   4.0    ?Chloride 96 - 112  mEq/L 103   101    ?CO2 19 - 32 mEq/L 26   23    ?Calcium 8.4 - 10.5 mg/dL 9.2   9.2    ? ?Chest imaging: ?CXR 03/06/22 ?Improved opacities of the right mid lung. ? ?CT Chest 11/29/21 ?Patchy ground-glass

## 2022-03-08 ENCOUNTER — Other Ambulatory Visit: Payer: Self-pay | Admitting: Physician Assistant

## 2022-03-16 ENCOUNTER — Ambulatory Visit: Payer: BC Managed Care – PPO | Admitting: Radiology

## 2022-03-16 ENCOUNTER — Other Ambulatory Visit (HOSPITAL_COMMUNITY)
Admission: RE | Admit: 2022-03-16 | Discharge: 2022-03-16 | Disposition: A | Discharge status: Home / Self Care | Payer: BC Managed Care – PPO | Source: Ambulatory Visit | Attending: Radiology | Admitting: Radiology

## 2022-03-16 ENCOUNTER — Encounter: Payer: Self-pay | Admitting: Radiology

## 2022-03-16 VITALS — BP 126/68 | Ht 63.25 in | Wt 158.0 lb

## 2022-03-16 DIAGNOSIS — N958 Other specified menopausal and perimenopausal disorders: Secondary | ICD-10-CM | POA: Diagnosis not present

## 2022-03-16 DIAGNOSIS — Z01419 Encounter for gynecological examination (general) (routine) without abnormal findings: Secondary | ICD-10-CM | POA: Diagnosis not present

## 2022-03-16 DIAGNOSIS — Z13820 Encounter for screening for osteoporosis: Secondary | ICD-10-CM

## 2022-03-16 MED ORDER — ESTRADIOL 0.1 MG/GM VA CREA: 1.0000 g | TOPICAL_CREAM | VAGINAL | 12 refills | Status: DC

## 2022-03-16 NOTE — Progress Notes (Signed)
? ?  Lindsey Knight September 14, 1961 [id number] ? ? ?History: Postmenopausal 61 y.o. presents for annual exam as a new patient. Voiding more at night. Severe vaginal dryness and perineal dry skin/tenderness. PCP managing anemia and increased platelets.  ? ? ?Gynecologic History ?Postmenopausal ?Last Pap: 2019. Results were: normal ?Last mammogram: 07/2021. Results were: normal ?Last colonoscopy: 3/23 ?HRT use: never ? ?Obstetric History ?OB History  ?Gravida Para Term Preterm AB Living  ?4 2       2   ?SAB IAB Ectopic Multiple Live Births  ?        2  ?  ?# Outcome Date GA Lbr Len/2nd Weight Sex Delivery Anes PTL Lv  ?4 Gravida           ?3 Gravida           ?2 Para           ?1 Para           ? ? ? ?The following portions of the patient's history were reviewed and updated as appropriate: allergies, current medications, past family history, past medical history, past social history, past surgical history, and problem list. ? ?Review of Systems ?Pertinent items noted in HPI and remainder of comprehensive ROS otherwise negative.  ?Past medical history, past surgical history, family history and social history were all reviewed and documented in the EPIC chart. ? ?Exam: ? ?Vitals:  ? 03/16/22 0956  ?BP: 126/68  ?Weight: 158 lb (71.7 kg)  ?Height: 5' 3.25" (1.607 m)  ? ?Body mass index is 27.77 kg/m?. ? ?General appearance:  Normal ?Thyroid:  Symmetrical, normal in size, without palpable masses or nodularity. ?Respiratory ? Auscultation:  Clear without wheezing or rhonchi ?Cardiovascular ? Auscultation:  Regular rate, without rubs, murmurs or gallops ? Edema/varicosities:  Not grossly evident ?Abdominal ? Soft,nontender, without masses, guarding or rebound. ? Liver/spleen:  No organomegaly noted ? Hernia:  None appreciated ? Skin ? Inspection:  Grossly normal ?Breasts: Examined lying and sitting.  ? Right: Without masses, retractions, nipple discharge or axillary adenopathy. ? ? Left: Without masses, retractions, nipple discharge or  axillary adenopathy. ?Genitourinary  ? Inguinal/mons:  Normal without inguinal adenopathy ? External genitalia:  Atrophied vulva with no masses, tenderness, or lesions ? BUS/Urethra/Skene's glands:  Normal ? Vagina:  Normal appearing with normal color and discharge, no lesions. Atrophy: severe  ? Cervix:  Normal appearing without discharge or lesions ? Uterus:  Normal in size, shape and contour.  Midline and mobile, nontender ? Adnexa/parametria:   ?  Rt: Normal in size, without masses or tenderness. ?  Lt: Normal in size, without masses or tenderness. ? Anus and perineum: Normal ?  ? ?Patient informed chaperone available to be present for breast and pelvic exam. Patient has requested no chaperone to be present. Patient has been advised what will be completed during breast and pelvic exam.  ? ?Assessment/Plan:   ?1. Well woman exam with routine gynecological exam ?- Cytology - PAP( [place]) ? ?2. Genitourinary syndrome of menopause ?- estradiol (ESTRACE VAGINAL) 0.1 MG/GM vaginal cream; Place 1 g vaginally 3 (three) times a week.  Dispense: 42.5 g; Refill: 12 ? ?3. Screening for osteoporosis ?- DG Bone Density; Future ?  ? ?Discussed SBE, colonoscopy and DEXA screening as directed. Recommend 125mns of exercise weekly, including weight bearing exercise. Encouraged the use of seatbelts and sunscreen.  ?Return in 1 year for annual or sooner prn. ? ?CKerry DoryWHNP-BC, 10:28 AM 03/16/2022  ?

## 2022-03-20 ENCOUNTER — Encounter: Payer: Self-pay | Admitting: Physician Assistant

## 2022-03-21 LAB — CYTOLOGY - PAP
Comment: NEGATIVE
Diagnosis: NEGATIVE
High risk HPV: NEGATIVE

## 2022-03-22 ENCOUNTER — Ambulatory Visit (INDEPENDENT_AMBULATORY_CARE_PROVIDER_SITE_OTHER): Payer: BC Managed Care – PPO

## 2022-03-22 ENCOUNTER — Other Ambulatory Visit: Payer: Self-pay | Admitting: Radiology

## 2022-03-22 DIAGNOSIS — Z13820 Encounter for screening for osteoporosis: Secondary | ICD-10-CM | POA: Diagnosis not present

## 2022-03-22 DIAGNOSIS — M810 Age-related osteoporosis without current pathological fracture: Secondary | ICD-10-CM

## 2022-03-22 DIAGNOSIS — Z780 Asymptomatic menopausal state: Secondary | ICD-10-CM | POA: Diagnosis not present

## 2022-04-12 ENCOUNTER — Encounter: Payer: Self-pay | Admitting: Physician Assistant

## 2022-04-12 DIAGNOSIS — K50919 Crohn's disease, unspecified, with unspecified complications: Secondary | ICD-10-CM

## 2022-04-12 DIAGNOSIS — E538 Deficiency of other specified B group vitamins: Secondary | ICD-10-CM

## 2022-04-13 NOTE — Telephone Encounter (Signed)
Please advise if okay to order Vit B12 and Vit D?

## 2022-04-14 ENCOUNTER — Other Ambulatory Visit: Payer: Self-pay

## 2022-04-14 DIAGNOSIS — R918 Other nonspecific abnormal finding of lung field: Secondary | ICD-10-CM

## 2022-04-18 ENCOUNTER — Encounter: Payer: Self-pay | Admitting: Obstetrics & Gynecology

## 2022-04-18 ENCOUNTER — Ambulatory Visit: Payer: BC Managed Care – PPO | Admitting: Obstetrics & Gynecology

## 2022-04-18 ENCOUNTER — Other Ambulatory Visit (INDEPENDENT_AMBULATORY_CARE_PROVIDER_SITE_OTHER): Payer: BC Managed Care – PPO

## 2022-04-18 DIAGNOSIS — M810 Age-related osteoporosis without current pathological fracture: Secondary | ICD-10-CM | POA: Diagnosis not present

## 2022-04-18 DIAGNOSIS — E538 Deficiency of other specified B group vitamins: Secondary | ICD-10-CM | POA: Diagnosis not present

## 2022-04-18 DIAGNOSIS — D509 Iron deficiency anemia, unspecified: Secondary | ICD-10-CM

## 2022-04-18 DIAGNOSIS — B3731 Acute candidiasis of vulva and vagina: Secondary | ICD-10-CM | POA: Diagnosis not present

## 2022-04-18 DIAGNOSIS — K50919 Crohn's disease, unspecified, with unspecified complications: Secondary | ICD-10-CM

## 2022-04-18 LAB — CBC
HCT: 39.1 % (ref 36.0–46.0)
Hemoglobin: 13.4 g/dL (ref 12.0–15.0)
MCHC: 34.3 g/dL (ref 30.0–36.0)
MCV: 86.2 fl (ref 78.0–100.0)
Platelets: 314 10*3/uL (ref 150.0–400.0)
RBC: 4.54 Mil/uL (ref 3.87–5.11)
RDW: 13.9 % (ref 11.5–15.5)
WBC: 8.7 10*3/uL (ref 4.0–10.5)

## 2022-04-18 LAB — IBC + FERRITIN
Ferritin: 29.7 ng/mL (ref 10.0–291.0)
Iron: 53 ug/dL (ref 42–145)
Saturation Ratios: 18.4 % — ABNORMAL LOW (ref 20.0–50.0)
TIBC: 288.4 ug/dL (ref 250.0–450.0)
Transferrin: 206 mg/dL — ABNORMAL LOW (ref 212.0–360.0)

## 2022-04-18 LAB — VITAMIN D 25 HYDROXY (VIT D DEFICIENCY, FRACTURES): VITD: 24.37 ng/mL — ABNORMAL LOW (ref 30.00–100.00)

## 2022-04-18 LAB — VITAMIN B12: Vitamin B-12: 437 pg/mL (ref 211–911)

## 2022-04-18 MED ORDER — FLUCONAZOLE 150 MG PO TABS: 150.0000 mg | ORAL_TABLET | ORAL | 1 refills | Status: AC

## 2022-04-18 MED ORDER — ALENDRONATE SODIUM 70 MG PO TABS: 70.0000 mg | ORAL_TABLET | ORAL | 4 refills | Status: DC

## 2022-04-18 NOTE — Progress Notes (Signed)
    Lindsey Knight Feb 13, 1961 [id number]        60 y.o.  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  Had 2 falls in the last 2 years, one on a slippery floor and the other while hiking, without fracture.   OB History  Gravida Para Term Preterm AB Living  3 2     1 2   SAB IAB Ectopic Multiple Live Births    1     2    # Outcome Date GA Lbr Len/2nd Weight Sex Delivery Anes PTL Lv  3 IAB           2 Para           1 Para             Past medical history,surgical history, problem list, medications, allergies, family history and social history were all reviewed and documented in the EPIC chart.   Directed ROS with pertinent positives and negatives documented in the history of present illness/assessment and plan.  Exam:  There were no vitals filed for this visit. General appearance:  Normal  First Bone Density 03/22/2022:  Osteoporosis with T-Score at -4.1 at the [facility].     Assessment/Plan:  61 y.o. [id number]   1. Osteoporosis of lumbar spine First Bone Density on 03/22/22 showing Osteoporosis at the [facility].Postmenopause, well on no HRT.  Had 2 falls in the last 2 years, one on a slippery floor and the other while hiking, without fracture.  First Bone Density on 03/22/2022 showing Osteoporosis with a T-Score at -4.1 at the [facility].  Counseling on severe Osteoporosis.  Management reviewed in details including Vit D supplement, Ca++ total of 1.5 g/d and regular weight bearing physical activity.  Given the severity, decision to start on Fosamax.  No CI.  Risks and benefits thoroughly reviewed, especially the small risk of jaw necrosis. Recommendation to interrupt Fosamax treatment around major dental work. Usage reviewed and prescription sent to pharmacy. Repeat BD in 1 year. - DG Bone Density; Future  2. Yeast vaginitis Clinical yeast vaginitis.  Will treat with  Fluconazole 150 mg PO every other day x 3.  Usage reviewed and prescription sent to pharmacy.  Other orders - Cholecalciferol (VITAMIN D3 ADULT GUMMIES PO) - MAGNESIUM GLYCINATE PO - Multiple Vitamins-Minerals (MULTIVITAMIN ADULT) CHEW - Calcium Carbonate Antacid (TUMS PO); Take by mouth. - alendronate (FOSAMAX) 70 MG tablet; Take 1 tablet (70 mg total) by mouth every 7 (seven) days. Take with a full glass of water on an empty stomach. - fluconazole (DIFLUCAN) 150 MG tablet; Take 1 tablet (150 mg total) by mouth every other day for 3 doses.   Counseling and management of Osteoporosis, including the risks of fracture and medical treatment for 30 minutes.  Princess Bruins MD, 11:19 AM 04/18/2022

## 2022-04-20 ENCOUNTER — Encounter: Payer: Self-pay | Admitting: Physician Assistant

## 2022-04-20 ENCOUNTER — Telehealth: Payer: Self-pay | Admitting: Pulmonary Disease

## 2022-04-20 NOTE — Telephone Encounter (Signed)
carol with [facility] (pt insurance) is calling today stating that the pt will now be seen at [facility] and is asking for the recent ct order to be sent over as well as the pts demographic sheet...pt need appt is 04/26/22 at 12:15 with [facility]      Send to: attn morgan fax:[phone] Josem Kaufmann is now for [facility] ([phone])

## 2022-04-21 ENCOUNTER — Other Ambulatory Visit: Payer: Self-pay

## 2022-04-21 DIAGNOSIS — D509 Iron deficiency anemia, unspecified: Secondary | ICD-10-CM

## 2022-04-21 NOTE — Telephone Encounter (Signed)
The requested documents printed and placed in outgoing fax

## 2022-04-25 ENCOUNTER — Encounter: Payer: Self-pay | Admitting: Obstetrics & Gynecology

## 2022-04-26 ENCOUNTER — Telehealth: Payer: Self-pay | Admitting: Pulmonary Disease

## 2022-04-26 DIAGNOSIS — R918 Other nonspecific abnormal finding of lung field: Secondary | ICD-10-CM | POA: Diagnosis not present

## 2022-04-26 NOTE — Telephone Encounter (Signed)
Faxed order for CT scan to number that was provided. Told Lindsey Knight we will need  a copy of the report or disk sent to [place] from [facility] or she needs to obtain it herself to bring to Dr Erin Fulling. Nothing further

## 2022-04-27 ENCOUNTER — Other Ambulatory Visit: Payer: BC Managed Care – PPO

## 2022-04-28 ENCOUNTER — Telehealth: Payer: Self-pay | Admitting: Pulmonary Disease

## 2022-04-28 NOTE — Telephone Encounter (Signed)
Spoke with Raquel Sarna and confirmed Lindsey Knight needs CT report from 11/29/21. Report was printed from Epic and faxed to confirmed fax number. Nothing further needed at this time.

## 2022-04-28 NOTE — Telephone Encounter (Signed)
Lindsey Knight from [facility] is on the phone trying to speak to someone regarding pt stating that she is trying to send images off but [facility] is the reason they cant and [facility] continues to send the wrong documents, and have not returned their calls. Correct documents needed is the CT chest report from 11/29/21 and not chest xray report from 03/06/22. Her call back number [phone] Fax [phone]

## 2022-05-10 ENCOUNTER — Encounter: Payer: Self-pay | Admitting: Pulmonary Disease

## 2022-05-12 NOTE — Telephone Encounter (Signed)
CD placed in CD basket upfront.

## 2022-05-15 ENCOUNTER — Ambulatory Visit (INDEPENDENT_AMBULATORY_CARE_PROVIDER_SITE_OTHER): Payer: BC Managed Care – PPO | Admitting: Internal Medicine

## 2022-05-15 ENCOUNTER — Encounter: Payer: Self-pay | Admitting: Internal Medicine

## 2022-05-15 DIAGNOSIS — K633 Ulcer of intestine: Secondary | ICD-10-CM

## 2022-05-15 DIAGNOSIS — D500 Iron deficiency anemia secondary to blood loss (chronic): Secondary | ICD-10-CM | POA: Diagnosis not present

## 2022-05-15 NOTE — Progress Notes (Signed)
SN: [id number] Exp: 2023-07-28 LOT: [id number]  Patient arrived for VCE. Reported the prep went well. This RN explained capsule dietary restrictions for the next few hours. Pt advised to return at 4 pm to return capsule equipment.  Patient verbalized understanding. Opened capsule, ensured capsule was flashing prior to the patient swallowing the capsule. Patient swallowed capsule without difficulty.  Patient told to call the office with any questions and if capsule has not passed after 72 hours. No further questions by the conclusion of the visit.

## 2022-05-16 ENCOUNTER — Other Ambulatory Visit: Payer: Self-pay | Admitting: Family Medicine

## 2022-05-19 ENCOUNTER — Encounter: Payer: Self-pay | Admitting: Pulmonary Disease

## 2022-05-19 NOTE — Telephone Encounter (Signed)
Dr Erin Fulling I am wondering if you have had a chance to look at the CT I had done on 6/7. I have not seen the radiology report so have no idea of the result.  They sent the report to you but did not post it on the [facility] portal. Apologies for the extra coordination this has caused. Thank you [employer]

## 2022-05-22 ENCOUNTER — Encounter: Payer: Self-pay | Admitting: Internal Medicine

## 2022-05-22 ENCOUNTER — Telehealth: Payer: Self-pay

## 2022-05-22 DIAGNOSIS — K633 Ulcer of intestine: Secondary | ICD-10-CM

## 2022-05-22 DIAGNOSIS — D500 Iron deficiency anemia secondary to blood loss (chronic): Secondary | ICD-10-CM

## 2022-05-22 NOTE — Telephone Encounter (Signed)
VCE results 05/15/22  Findings: one ulcer with surrounding erythema in distal small bowel  Recommendations:  - Avoid ASA/NSAIDs - Continue iron supplement, CBC/IBC + Ferritin in late August 2023 - Office f/u in September 2023 - consider fecal calprotectin and possible CT enterography (depending on symptoms and response to iron replacement)  Called and spoke with patient regarding her VCE results and recommendations. Pt reports that her PCP has her taking Ferrous sulfate every other day. I told her that based on her last labs Dr. Hilarie Fredrickson wanted her taking daily unless she is not able to tolerate it. Pt will try to take iron daily. Pt is aware that we will repeat labs in late August, she knows that she will receive a reminder. Pt has been scheduled for a f/u appt with Dr. Hilarie Fredrickson on Tuesday, 08/01/22 at 3 pm. Pt verbalized understanding and had no concerns at the end of the call.   Lab orders in epic. Reminder sent to Jomarie Longs, RN for labs in August.

## 2022-05-24 NOTE — Telephone Encounter (Signed)
A colored copy of VCE results have been mailed to the patient.

## 2022-05-26 NOTE — Telephone Encounter (Signed)
Dr Erin Fulling will you please advise on the result of pt's CT? Thank you

## 2022-05-31 ENCOUNTER — Other Ambulatory Visit: Payer: Self-pay | Admitting: Physician Assistant

## 2022-06-28 ENCOUNTER — Other Ambulatory Visit: Payer: Self-pay | Admitting: Physician Assistant

## 2022-07-12 ENCOUNTER — Other Ambulatory Visit (INDEPENDENT_AMBULATORY_CARE_PROVIDER_SITE_OTHER): Payer: BC Managed Care – PPO

## 2022-07-12 ENCOUNTER — Encounter: Payer: Self-pay | Admitting: Physician Assistant

## 2022-07-12 ENCOUNTER — Ambulatory Visit: Payer: BC Managed Care – PPO | Admitting: Physician Assistant

## 2022-07-12 VITALS — BP 120/68 | HR 70 | Temp 98.3°F | Ht 63.25 in | Wt 164.2 lb

## 2022-07-12 DIAGNOSIS — E559 Vitamin D deficiency, unspecified: Secondary | ICD-10-CM

## 2022-07-12 DIAGNOSIS — E538 Deficiency of other specified B group vitamins: Secondary | ICD-10-CM

## 2022-07-12 DIAGNOSIS — M19031 Primary osteoarthritis, right wrist: Secondary | ICD-10-CM

## 2022-07-12 DIAGNOSIS — Z23 Encounter for immunization: Secondary | ICD-10-CM | POA: Diagnosis not present

## 2022-07-12 DIAGNOSIS — K633 Ulcer of intestine: Secondary | ICD-10-CM | POA: Diagnosis not present

## 2022-07-12 DIAGNOSIS — Z136 Encounter for screening for cardiovascular disorders: Secondary | ICD-10-CM | POA: Diagnosis not present

## 2022-07-12 DIAGNOSIS — Z13220 Encounter for screening for lipoid disorders: Secondary | ICD-10-CM | POA: Diagnosis not present

## 2022-07-12 DIAGNOSIS — D500 Iron deficiency anemia secondary to blood loss (chronic): Secondary | ICD-10-CM | POA: Diagnosis not present

## 2022-07-12 LAB — CBC WITH DIFFERENTIAL/PLATELET
Basophils Absolute: 0 10*3/uL (ref 0.0–0.1)
Basophils Relative: 0.4 % (ref 0.0–3.0)
Eosinophils Absolute: 0.3 10*3/uL (ref 0.0–0.7)
Eosinophils Relative: 4.4 % (ref 0.0–5.0)
HCT: 41 % (ref 36.0–46.0)
Hemoglobin: 14.1 g/dL (ref 12.0–15.0)
Lymphocytes Relative: 22.8 % (ref 12.0–46.0)
Lymphs Abs: 1.4 10*3/uL (ref 0.7–4.0)
MCHC: 34.4 g/dL (ref 30.0–36.0)
MCV: 86.6 fl (ref 78.0–100.0)
Monocytes Absolute: 0.6 10*3/uL (ref 0.1–1.0)
Monocytes Relative: 9.8 % (ref 3.0–12.0)
Neutro Abs: 3.8 10*3/uL (ref 1.4–7.7)
Neutrophils Relative %: 62.6 % (ref 43.0–77.0)
Platelets: 295 10*3/uL (ref 150.0–400.0)
RBC: 4.73 Mil/uL (ref 3.87–5.11)
RDW: 12.6 % (ref 11.5–15.5)
WBC: 6 10*3/uL (ref 4.0–10.5)

## 2022-07-12 LAB — LIPID PANEL
Cholesterol: 187 mg/dL (ref 0–200)
HDL: 54.5 mg/dL (ref >39.00)
LDL Cholesterol: 105 mg/dL — ABNORMAL HIGH (ref 0–99)
NonHDL: 132.32
Total CHOL/HDL Ratio: 3
Triglycerides: 139 mg/dL (ref 0.0–149.0)
VLDL: 27.8 mg/dL (ref 0.0–40.0)

## 2022-07-12 LAB — COMPREHENSIVE METABOLIC PANEL
ALT: 18 U/L (ref 0–35)
AST: 29 U/L (ref 0–37)
Albumin: 4.2 g/dL (ref 3.5–5.2)
Alkaline Phosphatase: 59 U/L (ref 39–117)
BUN: 15 mg/dL (ref 6–23)
CO2: 25 mEq/L (ref 19–32)
Calcium: 9.1 mg/dL (ref 8.4–10.5)
Chloride: 101 mEq/L (ref 96–112)
Creatinine, Ser: 0.66 mg/dL (ref 0.40–1.20)
GFR: 94.18 mL/min (ref >60.00)
Glucose, Bld: 104 mg/dL — ABNORMAL HIGH (ref 70–99)
Potassium: 3.9 mEq/L (ref 3.5–5.1)
Sodium: 135 mEq/L (ref 135–145)
Total Bilirubin: 0.7 mg/dL (ref 0.2–1.2)
Total Protein: 7.5 g/dL (ref 6.0–8.3)

## 2022-07-12 LAB — IBC + FERRITIN
Ferritin: 66.6 ng/mL (ref 10.0–291.0)
Iron: 65 ug/dL (ref 42–145)
Saturation Ratios: 21 % (ref 20.0–50.0)
TIBC: 309.4 ug/dL (ref 250.0–450.0)
Transferrin: 221 mg/dL (ref 212.0–360.0)

## 2022-07-12 LAB — VITAMIN B12: Vitamin B-12: >1500 pg/mL — ABNORMAL HIGH (ref 211–911)

## 2022-07-12 LAB — VITAMIN D 25 HYDROXY (VIT D DEFICIENCY, FRACTURES): VITD: 51.43 ng/mL (ref 30.00–100.00)

## 2022-07-12 NOTE — Progress Notes (Signed)
Lindsey Knight is a 61 y.o. female here for a new problem.  History of Present Illness:   Chief Complaint  Patient presents with   Thumb pain    Pt c/o pain in Right thumb x several months, worse past month. Having pain with certain movements.    HPI  Right thumb pain Spring 2021 had injection in L [facility] joint, this was effective for her.  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  Now she is having pain in her right CMC joint. Doing a lot of hand sewing. Has tried to rest it but still having significant pain. Has sharp pain with pinching and other motions. Has been going on for several months.  Has tried topical voltaren gel. Cannot take ibuprofen. Did try a brace a week or so ago and this was not helpful.  Denies weakness in hand.  She is also going to go ahead and get blood work for upcoming physical.  We are going to order this for her today.  Past Medical History:  Diagnosis Date   Anemia    Asthma    Osteoporosis    Vaginal delivery    x 2, 1985, 1990     Social History   Tobacco Use   Smoking status: Never    Passive exposure: Never   Smokeless tobacco: Never  Vaping Use   Vaping Use: Never used  Substance Use Topics   Alcohol use: Not Currently    Alcohol/week: 2.0 standard drinks of alcohol    Types: 2 Standard drinks or equivalent per week   Drug use: Never    Past Surgical History:  Procedure Laterality Date   COLONOSCOPY  01/23/2022   normal - 2012   NASAL SINUS SURGERY Bilateral 2015    Family History  Problem Relation Age of Onset   Colon cancer Mother    Heart failure Father    Crohn's disease Sister    Pancreatic cancer Other    Ovarian cancer Other    Uterine cancer Other     Allergies  Allergen Reactions   Tape Rash    Current Medications:   Current Outpatient Medications:    albuterol (VENTOLIN HFA) 108 (90 Base) MCG/ACT inhaler, Inhale 1 puff into the lungs every 6 (six) hours as needed., Disp: , Rfl:    alendronate (FOSAMAX) 70 MG  tablet, Take 1 tablet (70 mg total) by mouth every 7 (seven) days. Take with a full glass of water on an empty stomach., Disp: 12 tablet, Rfl: 4   Ascorbic Acid (VITAMIN C CR) 500 MG TBCR, Take by mouth., Disp: , Rfl:    Cannabinoids (THC FREE PO), Take 2.5 mg by mouth daily in the afternoon., Disp: , Rfl:    Cholecalciferol (VITAMIN D3 ADULT GUMMIES PO), , Disp: , Rfl:    Cyanocobalamin (VITAMIN B-12 PO), Take 1-2 capsules by mouth daily in the afternoon. 1500-3000 mcg, Disp: , Rfl:    estradiol (ESTRACE VAGINAL) 0.1 MG/GM vaginal cream, Place 1 g vaginally 3 (three) times a week., Disp: 42.5 g, Rfl: 12   ferrous sulfate 324 MG TBEC, Take 324 mg by mouth., Disp: , Rfl:    fexofenadine (ALLEGRA) 180 MG tablet, Take 180 mg by mouth daily., Disp: , Rfl:    Fluticasone-Salmeterol (ADVAIR) 100-50 MCG/DOSE AEPB, Inhale 1 puff into the lungs 2 (two) times daily as needed., Disp: , Rfl:    MAGNESIUM GLYCINATE PO, , Disp: , Rfl:    Melatonin 5 MG CHEW, Chew by  mouth., Disp: , Rfl:    montelukast (SINGULAIR) 10 MG tablet, TAKE 1 TABLET BY MOUTH EVERYDAY AT BEDTIME, Disp: 90 tablet, Rfl: 3   Multiple Vitamins-Minerals (MULTIVITAMIN ADULT) CHEW, , Disp: , Rfl:    pantoprazole (PROTONIX) 40 MG tablet, Take 1 tablet (40 mg total) by mouth daily., Disp: 30 tablet, Rfl: 11   traZODone (DESYREL) 100 MG tablet, TAKE 1 TABLET BY MOUTH EVERYDAY AT BEDTIME, Disp: 90 tablet, Rfl: 0   Review of Systems:   ROS Negative unless otherwise specified per HPI.  Vitals:   Vitals:   07/12/22 0919  BP: 120/68  Pulse: 70  Temp: 98.3 F (36.8 C)  TempSrc: Temporal  SpO2: 95%  Weight: 164 lb 4 oz (74.5 kg)  Height: 5' 3.25" (1.607 m)     Body mass index is 28.87 kg/m.  Physical Exam:   Physical Exam Constitutional:      Appearance: Normal appearance. She is well-developed.  HENT:     Head: Normocephalic and atraumatic.  Eyes:     General: Lids are normal.     Extraocular Movements: Extraocular movements  intact.     Conjunctiva/sclera: Conjunctivae normal.  Pulmonary:     Effort: Pulmonary effort is normal.  Musculoskeletal:        General: Normal range of motion.     Cervical back: Normal range of motion and neck supple.     Comments: Right CMC joint with mild tenderness to palpation No decreased range of motion  Skin:    General: Skin is warm and dry.  Neurological:     Mental Status: She is alert and oriented to person, place, and time.  Psychiatric:        Attention and Perception: Attention and perception normal.        Mood and Affect: Mood normal.        Behavior: Behavior normal.        Thought Content: Thought content normal.        Judgment: Judgment normal.     Assessment and Plan:   Localized primary osteoarthritis of carpometacarpal (CMC) joint of right wrist Referral to sports medicine for further evaluation Exercises provided  Vitamin B 12 deficiency Update blood work and discuss at physical  Vitamin D deficiency Update blood work and discussed at physical  Encounter for lipid screening for cardiovascular disease Update blood work and discuss at physical    [name], PA-C

## 2022-07-12 NOTE — Patient Instructions (Signed)
It was great to see you!  A referral has been placed for you to see one of our fantastic providers at [place]. Someone from their office will be in touch soon regarding scheduling your appointment.  Their location:  [facility]  [address] on the 1st floor Phone number [phone] Fax [phone].   This location is across the street from the entrance to [employer] and in the same complex as the [facility]

## 2022-07-13 ENCOUNTER — Ambulatory Visit: Payer: Self-pay

## 2022-07-13 ENCOUNTER — Ambulatory Visit: Payer: BC Managed Care – PPO | Admitting: Family Medicine

## 2022-07-13 ENCOUNTER — Encounter: Payer: Self-pay | Admitting: Family Medicine

## 2022-07-13 ENCOUNTER — Ambulatory Visit: Payer: BC Managed Care – PPO

## 2022-07-13 VITALS — BP 154/90 | HR 104 | Ht 63.0 in | Wt 164.8 lb

## 2022-07-13 DIAGNOSIS — M65311 Trigger thumb, right thumb: Secondary | ICD-10-CM

## 2022-07-13 DIAGNOSIS — M79644 Pain in right finger(s): Secondary | ICD-10-CM

## 2022-07-13 DIAGNOSIS — G8929 Other chronic pain: Secondary | ICD-10-CM

## 2022-07-13 NOTE — Progress Notes (Signed)
   I, Peterson Lombard, LAT, ATC acting as a scribe for Lynne Leader, MD.  Subjective:    CC: R thumb pain  HPI: Pt is a 61 y/o female c/o R thumb pain x several months, worsen over the last month. Pt is a retired Therapist, sports. Pt notes that she has been doing a lot of sewing, which exacerbates her R thumb pain. Pt is R-hand dominate. Pt locates pain to the R 1st MCP joint w/ no triggering. Pt had a similar pain in her L thumb in 2021  R thumb swelling: no Grip strength: decreased Aggravates: holding objects, pinching motions Treatments tried: Voltaren gel, rest, grip ball, Tylenol  Pertinent review of Systems: No fevers or chills.  Relevant historical information: History of colon cancer and Crohn's disease.  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   Objective:    Vitals:   07/13/22 1251  BP: (!) 154/90  Pulse: (!) 104  SpO2: 95%   General: Well Developed, well nourished, and in no acute distress.   MSK: Right thumb: Normal appearing Tender palpation right first palmar MCP. Pain with flexion at IP joint although no triggering is present.  Intact strength.  Lab and Radiology Results  Procedure: Real-time Ultrasound Guided Injection of first MCP A1 pulley tendon sheath (trigger thumb injection) Device: Philips Affiniti 50G Images permanently stored and available for review in PACS Ultrasound evaluation prior to injection reveals hypoechoic fluid surrounding tendon sheath at A1 pulley first MCP consistent in appearance with trigger thumb. Verbal informed consent obtained.  Discussed risks and benefits of procedure. Warned about infection, bleeding, hyperglycemia damage to structures among others. Patient expresses understanding and agreement Time-out conducted.   Noted no overlying erythema, induration, or other signs of local infection.   Skin prepped in a sterile fashion.   Local anesthesia: Topical Ethyl chloride.   With sterile technique and under  real time ultrasound guidance: 40 mg of Kenalog and 1 mL of lidocaine injected into tendon sheath at A1 pulley. Fluid seen entering the tendon sheath.   Completed without difficulty   Pain immediately resolved suggesting accurate placement of the medication.   Advised to call if fevers/chills, erythema, induration, drainage, or persistent bleeding.   Images permanently stored and available for review in the ultrasound unit.  Impression: Technically successful ultrasound guided injection.    Impression and Recommendations:    Assessment and Plan: 61 y.o. female with right thumb pain.  Pain is consistent with developing trigger thumb.  Plan for injection today.  Plan for double Band-Aid splint and Voltaren gel.  Check back as needed.Marland Kitchen  PDMP not reviewed this encounter. Orders Placed This Encounter  Procedures   [place] LIMITED JOINT SPACE STRUCTURES UP RIGHT(NO LINKED CHARGES)    Order Specific Question:   Reason for Exam (SYMPTOM  OR DIAGNOSIS REQUIRED)    Answer:   right thumb pain    Order Specific Question:   Preferred imaging location?    Answer:   [place]   No orders of the defined types were placed in this encounter.   Discussed warning signs or symptoms. Please see discharge instructions. Patient expresses understanding.   The above documentation has been reviewed and is accurate and complete Lynne Leader, M.D.

## 2022-07-13 NOTE — Patient Instructions (Addendum)
Thank you for coming in today.   Please get an Xray today before you leave   You received an injection today. Seek immediate medical attention if the joint becomes red, extremely painful, or is oozing fluid.   Use the double bandaid splint.   Recheck as needed.   Take it easy with that hand.

## 2022-08-01 ENCOUNTER — Encounter: Payer: Self-pay | Admitting: Internal Medicine

## 2022-08-01 ENCOUNTER — Ambulatory Visit: Payer: BC Managed Care – PPO | Admitting: Internal Medicine

## 2022-08-01 VITALS — BP 122/66 | HR 74 | Ht 63.0 in | Wt 162.0 lb

## 2022-08-01 DIAGNOSIS — D509 Iron deficiency anemia, unspecified: Secondary | ICD-10-CM | POA: Diagnosis not present

## 2022-08-01 DIAGNOSIS — E538 Deficiency of other specified B group vitamins: Secondary | ICD-10-CM

## 2022-08-01 DIAGNOSIS — K219 Gastro-esophageal reflux disease without esophagitis: Secondary | ICD-10-CM | POA: Diagnosis not present

## 2022-08-01 MED ORDER — PANTOPRAZOLE SODIUM 20 MG PO TBEC: 20.0000 mg | DELAYED_RELEASE_TABLET | Freq: Every day | ORAL | 3 refills | Status: DC

## 2022-08-01 NOTE — Progress Notes (Unsigned)
ii

## 2022-08-01 NOTE — Patient Instructions (Signed)
_______________________________________________________  If you are age 62 or older, your body mass index should be between 23-30. Your Body mass index is 28.7 kg/m. If this is out of the aforementioned range listed, please consider follow up with your Primary Care Provider.  If you are age 44 or younger, your body mass index should be between 19-25. Your Body mass index is 28.7 kg/m. If this is out of the aformentioned range listed, please consider follow up with your Primary Care Provider.   ________________________________________________________  The [facility] providers would like to encourage you to use [facility] to communicate with providers for non-urgent requests or questions.  Due to long hold times on the telephone, sending your provider a message by [facility] may be a faster and more efficient way to get a response.  Please allow 48 business hours for a response.  Please remember that this is for non-urgent requests.  _______________________________________________________  We have sent the following medications to your pharmacy for you to pick up at your convenience:  Pantoprazole 58m - wean for a month and add Pepcid 289mtwice a day.   Stay on iron every other day and B12 two days a week  We will call you in a few months to remind you to come in for labs in December.

## 2022-08-02 ENCOUNTER — Encounter: Payer: Self-pay | Admitting: Internal Medicine

## 2022-08-02 NOTE — Progress Notes (Signed)
Subjective:    Patient ID: Lindsey Knight, female    DOB: 12/20/1960, 61 y.o.   MRN: [id number]  HPI Lindsey Knight is a 61 year old female with a past medical history of small hiatal hernia, iron deficiency anemia, family history of colon cancer in her mother at age 31, family history of Crohn's disease in her sister who is here for follow-up.  She is here alone today.  She had an upper endoscopy and colonoscopy on 01/23/2022.  See those notes for details.  We then followed up with a video capsule endoscopy given her IDA in June 2023.  This revealed an ulcer in the terminal ileum with limited to no other inflammatory changes other than isolated ulceration.  Today she reports that she has been feeling well.  She intermittently will have a right sided flank type pain almost like a "stitch in my side" which is intermittent.  Not related to eating.  Last less than a minute.  Can be present with change in position.  Does not hamper her activity.  Energy levels have been good.  She has not used Advil since March 2023.  She was using 400 mg twice daily for aches and pains particularly after a fall.  No abdominal pain.  She does notice gas which is worse at night over the past few months.  She is not having heartburn with the pantoprazole 40 mg.  Prior to this she was using Tums on a regular basis.  No dysphagia or odynophagia.  She has decreased iron intake to every other day.  She was taking B12 but backed off of this when her level was high. No diarrhea.  No blood in stool or melena.  Review of Systems As per HPI, otherwise negative  Current Medications, Allergies, Past Medical History, Past Surgical History, Family History and Social History were reviewed in [employer] record.     Objective:   Physical Exam BP 122/66   Pulse 74   Ht 5' 3"  (1.6 m)   Wt 162 lb (73.5 kg)   BMI 28.70 kg/m  Gen: awake, alert, NAD HEENT: anicteric Abd: soft, NT/ND, +BS throughout Ext: no  c/c/e Neuro: nonfocal     Latest Ref Rng & Units 07/12/2022   10:30 AM 04/18/2022    2:27 PM 01/23/2022    4:28 PM  CBC  WBC 4.0 - 10.5 K/uL 6.0  8.7  7.0   Hemoglobin 12.0 - 15.0 g/dL 14.1  13.4  12.4   Hematocrit 36.0 - 46.0 % 41.0  39.1  37.6   Platelets 150.0 - 400.0 K/uL 295.0  314.0  345.0    Iron/TIBC/Ferritin/ %Sat    Component Value Date/Time   IRON 65 07/12/2022 1030   TIBC 309.4 07/12/2022 1030   FERRITIN 66.6 07/12/2022 1030   IRONPCTSAT 21.0 07/12/2022 1030   CMP     Component Value Date/Time   NA 135 07/12/2022 1030   K 3.9 07/12/2022 1030   CL 101 07/12/2022 1030   CO2 25 07/12/2022 1030   GLUCOSE 104 (H) 07/12/2022 1030   BUN 15 07/12/2022 1030   CREATININE 0.66 07/12/2022 1030   CALCIUM 9.1 07/12/2022 1030   PROT 7.5 07/12/2022 1030   ALBUMIN 4.2 07/12/2022 1030   AST 29 07/12/2022 1030   ALT 18 07/12/2022 1030   ALKPHOS 59 07/12/2022 1030   BILITOT 0.7 07/12/2022 1030   GFRNONAA >60 01/21/2021 1409       Assessment & Plan:  61 year old female with a  past medical history of small hiatal hernia, iron deficiency anemia, family history of colon cancer in her mother at age 38, family history of Crohn's disease in her sister who is here for follow-up.  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  She had an isolated ulceration but also had used NSAIDs on a daily basis several months prior to this exam.  The ulcer certainly could have been NSAID related.  Given improvement in hemoglobin and lack of symptoms I have not recommended that we investigate this further at this time.  However should abdominal pain, diarrhea or recurrent iron deficiency be an issue I would consider fecal calprotectin and cross-sectional imaging -- Continue oral iron supplementation every other day -- Repeat CBC, ferritin plus IBC panel in December  2023 -- Continue to avoid NSAIDs  2.  GERD --well-controlled, will reduce pantoprazole to 20 mg daily.  We discussed weaning off PPI altogether which she can do but may have some rebound GERD symptoms due to transient hypergastrinemia associated with PPI.  We discussed she could take famotidine 20 mg twice daily for 1 to 2 weeks after stopping PPI and then use this medication as needed twice daily going forward.  We may find that she does better on low-dose PPI and if that is needed she is asked to notify me.  3.  B12 deficiency --responsive to oral B12.  We discussed that she really cannot take "too much" oral B12 as this is water-soluble and would simply be eliminated in urine.  That said she can likely get by with taking oral B12 2 days a week.  B12 can be checked again in 3 to 6 months.  4.  Family history of colon cancer --surveillance colonoscopy recommended in March 2028  30 minutes total spent today including patient facing time, coordination of care, reviewing medical history/procedures/pertinent radiology studies, and documentation of the encounter.

## 2022-08-03 DIAGNOSIS — Z1231 Encounter for screening mammogram for malignant neoplasm of breast: Secondary | ICD-10-CM | POA: Diagnosis not present

## 2022-08-03 LAB — HM MAMMOGRAPHY

## 2022-08-07 ENCOUNTER — Telehealth: Payer: Self-pay

## 2022-08-07 NOTE — Telephone Encounter (Signed)
Patient Advocate Encounter   Received notification from [employer] that prior authorization is required for Pantoprazole Sodium 20MG dr tablets. PA submitted and APPROVED on 08/07/2022.  Key [id number]  Effective: 08/07/2022 - 08/06/2023  Clista Bernhardt, CPhT Rx Patient Advocate Phone: [phone]

## 2022-08-08 ENCOUNTER — Encounter: Payer: Self-pay | Admitting: Physician Assistant

## 2022-08-08 ENCOUNTER — Ambulatory Visit (INDEPENDENT_AMBULATORY_CARE_PROVIDER_SITE_OTHER): Payer: BC Managed Care – PPO | Admitting: Physician Assistant

## 2022-08-08 VITALS — BP 128/70 | HR 77 | Temp 98.3°F | Ht 63.25 in | Wt 162.0 lb

## 2022-08-08 DIAGNOSIS — K50919 Crohn's disease, unspecified, with unspecified complications: Secondary | ICD-10-CM

## 2022-08-08 DIAGNOSIS — E663 Overweight: Secondary | ICD-10-CM | POA: Diagnosis not present

## 2022-08-08 DIAGNOSIS — R591 Generalized enlarged lymph nodes: Secondary | ICD-10-CM | POA: Diagnosis not present

## 2022-08-08 DIAGNOSIS — Z0000 Encounter for general adult medical examination without abnormal findings: Secondary | ICD-10-CM

## 2022-08-08 DIAGNOSIS — G4700 Insomnia, unspecified: Secondary | ICD-10-CM

## 2022-08-08 DIAGNOSIS — R9389 Abnormal findings on diagnostic imaging of other specified body structures: Secondary | ICD-10-CM

## 2022-08-08 NOTE — Patient Instructions (Addendum)
It was great to see you!  Keep up the good work!  Continue healthy lifestyle   I love the idea of you getting the RSV vaccine  We will get an ultrasound of your neck   Take care,  Aldona Bar

## 2022-08-08 NOTE — Progress Notes (Signed)
Subjective:    Lindsey Knight is a 61 y.o. female and is here for a comprehensive physical exam.   HPI   There are no preventive care reminders to display for this patient.   Acute Concerns: Lump on neck- She reports having a lump on her neck that has recently increased in size. She reports having it for the past couple of years but only found it changed in size recently. She recently hit the side of her neck with a shovel while gardening.  Chronic Issues: GERD- She is taking 20 mg protonix and reports her hand aches have improved while taking it. Otherwise she reports no new issues since reducing her dose.  Vitamin B12 deficiency- She is taking vitamin B12 supplements 2x weekly.   Abnormal chest CT -- she had this on 11/29/21 and saw pulm. Follow-up chest CT showed resolved long nodule, however it was done without contrast so they were unable to evaluate her lymphnodes accurately.  Multiple prominent mediastinal and axillary lymph nodes with an enlarged right hilar lymph node and left axillary lymph node. These are favored to be reactive, attention recommended on follow-up CT.  Health Maintenance: Immunizations -- She has the flu vaccine. She is planning on receiving the upcomming Covid-19 booster vaccine after it releases. She reports being UTD on the pneumonia vaccine at this time. She is interested in receiving the RSV vaccine.  Colonoscopy -- Last completed 01/23/2022. Results showed:  - The entire examined colon is normal. - Small internal hemorrhoids. - No specimens collected. Repeat in 5 years.  Mammogram -- Last completed 07/28/2021.  PAP -- Last completed 03/16/2022. Results are normal. Repeat in 3 years.  Bone Density -- Last completed 03/22/2022. Results showed she has osteoporosis. Repeat in 2 years.  She continues taking Fosamax every week on Sunday and reports no new issues while taking it.  Diet -- She is managing a healthy diet.  Sleep habits -- She continues  taking trazodone and reports no new issues with her sleeping habits.  Exercise -- She is starting to participate in more activity since her aches have improved. She participates in regular exercise by gardening and walking regularly. She is planning on playing pickle ball.  Weight --  She weighs 162 lb's during this visit.  Wt Readings from Last 3 Encounters:  08/08/22 162 lb (73.5 kg)  08/01/22 162 lb (73.5 kg)  07/13/22 164 lb 12.8 oz (74.8 kg)   Vision -- She is planning on seeing her eye doctor this week.  Dental -- She is UTD on dental care. Weight history: Wt Readings from Last 10 Encounters:  08/08/22 162 lb (73.5 kg)  08/01/22 162 lb (73.5 kg)  07/13/22 164 lb 12.8 oz (74.8 kg)  07/12/22 164 lb 4 oz (74.5 kg)  03/16/22 158 lb (71.7 kg)  03/06/22 159 lb 3.2 oz (72.2 kg)  01/23/22 153 lb (69.4 kg)  01/03/22 154 lb (69.9 kg)  12/30/21 153 lb (69.4 kg)  11/18/21 151 lb 3.2 oz (68.6 kg)   Body mass index is 28.47 kg/m. No LMP recorded. Patient is postmenopausal. Alcohol use:  reports current alcohol use of about 2.0 standard drinks of alcohol per week. Tobacco use:  Tobacco Use: Low Risk  (08/08/2022)   Patient History    Smoking Tobacco Use: Never    Smokeless Tobacco Use: Never    Passive Exposure: Never        07/12/2022    9:19 AM  Depression screen PHQ 2/9  Decreased Interest  0  Down, Depressed, Hopeless 0  PHQ - 2 Score 0     Other providers/specialists: Patient Care Team: [name], [place] as PCP - General (Physician Assistant)    PMHx, SurgHx, SocialHx, Medications, and Allergies were reviewed in the Visit Navigator and updated as appropriate.   Past Medical History:  Diagnosis Date   Allergy Child   Anemia    Arthritis    Asthma    GERD (gastroesophageal reflux disease) 10/22   Osteoporosis    Ulcer 6/23   Vaginal delivery    x 2, 1985, 1990     Past Surgical History:  Procedure Laterality Date   COLONOSCOPY  01/23/2022   normal -  2012   NASAL SINUS SURGERY Bilateral 2015     Family History  Problem Relation Age of Onset   Colon cancer Mother    Cancer Mother    Hearing loss Mother    Heart failure Father    Asthma Father    COPD Father    Hearing loss Father    Crohn's disease Sister    Pancreatic cancer Other    Ovarian cancer Other    Uterine cancer Other     Social History   Tobacco Use   Smoking status: Never    Passive exposure: Never   Smokeless tobacco: Never  Vaping Use   Vaping Use: Never used  Substance Use Topics   Alcohol use: Yes    Alcohol/week: 2.0 standard drinks of alcohol    Types: 2 Standard drinks or equivalent per week   Drug use: Never    Review of Systems:   Review of Systems  Constitutional:  Negative for chills, fever, malaise/fatigue and weight loss.  HENT:  Negative for hearing loss, sinus pain and sore throat.   Respiratory:  Negative for cough and hemoptysis.   Cardiovascular:  Negative for chest pain, palpitations, leg swelling and PND.  Gastrointestinal:  Negative for abdominal pain, constipation, diarrhea, heartburn, nausea and vomiting.  Genitourinary:  Negative for dysuria, frequency and urgency.  Musculoskeletal:  Negative for back pain, myalgias and neck pain.  Skin:  Negative for itching and rash.       (+) lump on neck  Endo/Heme/Allergies:  Negative for polydipsia.  Psychiatric/Behavioral:  Negative for depression. The patient is not nervous/anxious.     Objective:   BP 128/70 (BP Location: Left Arm, Patient Position: Sitting, Cuff Size: Normal)   Pulse 77   Temp 98.3 F (36.8 C) (Temporal)   Ht 5' 3.25" (1.607 m)   Wt 162 lb (73.5 kg)   SpO2 96%   BMI 28.47 kg/m  Body mass index is 28.47 kg/m.   General Appearance:    Alert, cooperative, no distress, appears stated age  Head:    Normocephalic, without obvious abnormality, atraumatic  Eyes:    PERRL, conjunctiva/corneas clear, EOM's intact, fundi    benign, both eyes  Ears:    Normal  TM's and external ear canals, both ears  Nose:   Nares normal, septum midline, mucosa normal, no drainage    or sinus tenderness  Throat:   Lips, mucosa, and tongue normal; teeth and gums normal  Neck:   Supple, symmetrical, trachea midline, no adenopathy;    thyroid:  left cervical LAD; no carotid   bruit or JVD  Back:     Symmetric, no curvature, ROM normal, no CVA tenderness  Lungs:     Clear to auscultation bilaterally, respirations unlabored  Chest Wall:    No  tenderness or deformity   Heart:    Regular rate and rhythm, S1 and S2 normal, no murmur, rub or gallop  Breast Exam:    Deferred  Abdomen:     Soft, non-tender, bowel sounds active all four quadrants,    no masses, no organomegaly  Genitalia:    Deferred  Extremities:   Extremities normal, atraumatic, no cyanosis or edema  Pulses:   2+ and symmetric all extremities  Skin:   Skin color, texture, turgor normal, no rashes or lesions  Lymph nodes:   Cervical, supraclavicular, and axillary nodes normal  Neurologic:   CNII-XII intact, normal strength, sensation and reflexes    throughout    Assessment/Plan:   Routine physical examination Today patient counseled on age appropriate routine health concerns for screening and prevention, each reviewed and up to date or declined. Immunizations reviewed and up to date or declined. Labs ordered and reviewed. Risk factors for depression reviewed and negative. Hearing function and visual acuity are intact. ADLs screened and addressed as needed. Functional ability and level of safety reviewed and appropriate. Education, counseling and referrals performed based on assessed risks today. Patient provided with a copy of personalized plan for preventive services.  Insomnia, unspecified type Currently well controlled Continue trazodone 100 mg daily  Overweight Continue efforts at healthy lifestyle  Lymphadenopathy Due to chronicity will obtain US  Crohn's disease with complication,  unspecified gastrointestinal tract location [facility]) UTD with gastro, no acute concerns  Abnormal chest CT Reviewed with patient I recommended pursuing chest CT with contrast for further imaging of abnormal LAD but she declined   Patient Counseling: [x]    Nutrition: Stressed importance of moderation in sodium/caffeine intake, saturated fat and cholesterol, caloric balance, sufficient intake of fresh fruits, vegetables, fiber, calcium, iron, and 1 mg of folate supplement per day (for females capable of pregnancy).  [x]    Stressed the importance of regular exercise.   [x]    Substance Abuse: Discussed cessation/primary prevention of tobacco, alcohol, or other drug use; driving or other dangerous activities under the influence; availability of treatment for abuse.   [x]    Injury prevention: Discussed safety belts, safety helmets, smoke detector, smoking near bedding or upholstery.   [x]    Sexuality: Discussed sexually transmitted diseases, partner selection, use of condoms, avoidance of unintended pregnancy  and contraceptive alternatives.  [x]    Dental health: Discussed importance of regular tooth brushing, flossing, and dental visits.  [x]    Health maintenance and immunizations reviewed. Please refer to Health maintenance section.    I,Shehryar Baig,acting as a Education administrator for [employer], PA.,have documented all relevant documentation on the behalf of [name], PA,as directed by  [name], PA while in the presence of [name], [place].   [name], PA-C [place]

## 2022-08-09 ENCOUNTER — Ambulatory Visit
Admission: RE | Admit: 2022-08-09 | Discharge: 2022-08-09 | Disposition: A | Discharge status: Home / Self Care | Payer: BC Managed Care – PPO | Source: Ambulatory Visit | Attending: Physician Assistant | Admitting: Physician Assistant

## 2022-08-09 ENCOUNTER — Other Ambulatory Visit: Payer: Self-pay | Admitting: Family Medicine

## 2022-08-09 ENCOUNTER — Other Ambulatory Visit: Payer: Self-pay | Admitting: Physician Assistant

## 2022-08-09 DIAGNOSIS — R591 Generalized enlarged lymph nodes: Secondary | ICD-10-CM

## 2022-08-09 DIAGNOSIS — R590 Localized enlarged lymph nodes: Secondary | ICD-10-CM | POA: Diagnosis not present

## 2022-08-09 MED ORDER — TRAZODONE HCL 100 MG PO TABS: 100.0000 mg | ORAL_TABLET | Freq: Every day | ORAL | 3 refills | Status: DC

## 2022-08-11 ENCOUNTER — Encounter: Payer: Self-pay | Admitting: Physician Assistant

## 2022-10-09 ENCOUNTER — Encounter: Payer: Self-pay | Admitting: Pulmonary Disease

## 2022-10-09 ENCOUNTER — Ambulatory Visit: Payer: BC Managed Care – PPO | Admitting: Pulmonary Disease

## 2022-10-09 VITALS — BP 116/74 | HR 72 | Ht 63.25 in | Wt 164.0 lb

## 2022-10-09 DIAGNOSIS — K219 Gastro-esophageal reflux disease without esophagitis: Secondary | ICD-10-CM | POA: Diagnosis not present

## 2022-10-09 DIAGNOSIS — J209 Acute bronchitis, unspecified: Secondary | ICD-10-CM

## 2022-10-09 DIAGNOSIS — J4520 Mild intermittent asthma, uncomplicated: Secondary | ICD-10-CM

## 2022-10-09 MED ORDER — FLUTICASONE-SALMETEROL 250-50 MCG/ACT IN AEPB: 1.0000 | INHALATION_SPRAY | Freq: Two times a day (BID) | RESPIRATORY_TRACT | 6 refills | Status: DC

## 2022-10-09 MED ORDER — PREDNISONE 10 MG PO TABS: 40.0000 mg | ORAL_TABLET | Freq: Every day | ORAL | 0 refills | Status: AC

## 2022-10-09 MED ORDER — AZITHROMYCIN 250 MG PO TABS: ORAL_TABLET | ORAL | 0 refills | Status: DC

## 2022-10-09 NOTE — Progress Notes (Signed)
Synopsis: Referred in February 2023 for abnormal chest imaging by [name], PA  Subjective:   PATIENT ID: Lindsey Knight, Lindsey Knight, Lindsey Knight  HPI  Chief Complaint  Patient presents with   Follow-up    F/U on cough. States she had a non-productive cough for 2 months as well as increased SOB. Denies any wheezing or chest pain.    Lindsey Knight is a 61 year old woman, never smoker with history of asthma who returns to pulmonary clinic for cough.   She reports increased cough over the past 2 months. She describes an event where she was bending over in the community garden and possibly had a significant reflux episode. She denies any viral illnesses recently.   She saw GI 08/01/22 with plan to reduce PPI dosing then transition to famotidine for 2 weeks then to use as needed. She reports the famotidine did not help, she reports burning sensation in her chest and throat. She then started omeprazole OTC which seems to have reduced her reflux symptoms.   She has advair 250-11mg 1 puff twice daily that she has been using as needed. She has albuterol as needed as well. She has not felt much relief.   She has not been on steroids or antibiotics.   OV 03/06/22 Chest x-ray today shows clearing of the right upper lobe infiltrates. Will await final radiology read.   She is using advair 100-523m 1 puff twice daily as needed along with daily allegra and montelukast for her asthma. No night time awakenings. No issues with spring allergies.  She has been feeling well since last visit. He cough has significantly decreased. She recently had EGD and C-scope and started on PPI therapy for GERD.   OV 01/03/22 Patient reports having respiratory illness in mid-December 2022 and was treated with Zpak on 12/13. PCP note from 11/08/2021 reviewed. Chest x-ray on 12/30 showed increased lung markings with mild right mid lung and left basilar linear scaring or atelectasis. She was also  noted to have increased platelet count. She had CT Chest on 11/29/21 that showed mediastinal, axillary and hilar lymphadenopathy along with patchy ground-glass and adjacent focal clustered nodularity in the right upper lobe and adjacent band like atelectasis/scarring at the junction of the right upper lobe.   Patient reports resolution of her infectious symptoms from December and is feeling at her baseline. She will occasionally have some right sided discomfort but it is overall improved from December. She notices the pain when laying down or moving when she sleeps. She denies any shortness of breath, fevers, chills or night sweats. She denies hemoptysis. She does have joint aches in general which include her knees, hips and occasional hand stiffness in the mornings.  She has history of asthma and mainly has issues with reactive airways disease when triggered by viral infections and uses inhalers as needed. She denies issues with seasonal allergies. She had sinus surgery in the past with significant reduction in frequency of sinus infections.   She is a never smoker. 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   Past Medical History:  Diagnosis Date   Allergy Child   Anemia    Arthritis    Asthma    GERD (gastroesophageal reflux disease) 10/22   Osteoporosis    Ulcer 6/23   Vaginal delivery    x 2, 1985, 1990     Family History  Problem Relation Age of  Onset   Colon cancer Mother    Cancer Mother    Hearing loss Mother    Heart failure Father    Asthma Father    COPD Father    Hearing loss Father    Crohn's disease Sister    Pancreatic cancer Other    Ovarian cancer Other    Uterine cancer Other      Social History   Socioeconomic History   Marital status: Married    Spouse name: Not on file   Number of children: Not on file   Years of education: Not on file   Highest education level: Not on file  Occupational History    Not on file  Tobacco Use   Smoking status: Never    Passive exposure: Never   Smokeless tobacco: Never  Vaping Use   Vaping Use: Never used  Substance and Sexual Activity   Alcohol use: Yes    Alcohol/week: 2.0 standard drinks of alcohol    Types: 2 Standard drinks or equivalent per week   Drug use: Never   Sexual activity: Not Currently    Partners: Male    Birth control/protection: Post-menopausal  Other Topics Concern   Not on file  Social History Narrative   Retired Marine scientist   Married   Has two local daughters and grandchildren   Social Determinants of Health   Financial Resource Strain: Not on file  Food Insecurity: Not on file  Transportation Needs: Not on file  Physical Activity: Not on file  Stress: Not on file  Social Connections: Not on file  Intimate Partner Violence: Not on file     Allergies  Allergen Reactions   Tape Rash     Outpatient Medications Prior to Visit  Medication Sig Dispense Refill   albuterol (VENTOLIN HFA) 108 (90 Base) MCG/ACT inhaler Inhale 1 puff into the lungs every 6 (six) hours as needed.     alendronate (FOSAMAX) 70 MG tablet Take 1 tablet (70 mg total) by mouth every 7 (seven) days. Take with a full glass of water on an empty stomach. 12 tablet 4   Ascorbic Acid (VITAMIN C CR) 500 MG TBCR Take 1 tablet by mouth every other day.     Cannabinoids (THC FREE PO) Take 2.5 mg by mouth daily in the afternoon.     Cholecalciferol (VITAMIN D3 ADULT GUMMIES PO)      Cyanocobalamin (VITAMIN B-12 PO) Take 3,000 mcg by mouth 2 (two) times a week.     estradiol (ESTRACE VAGINAL) 0.1 MG/GM vaginal cream Place 1 g vaginally 3 (three) times a week. 42.5 g 12   ferrous sulfate 324 MG TBEC Take 324 mg by mouth every other day.     fexofenadine (ALLEGRA) 180 MG tablet Take 180 mg by mouth daily.     MAGNESIUM GLYCINATE PO      Melatonin 5 MG CHEW Chew by mouth.     montelukast (SINGULAIR) 10 MG tablet TAKE 1 TABLET BY MOUTH EVERYDAY AT BEDTIME 90  tablet 3   Multiple Vitamins-Minerals (MULTIVITAMIN ADULT) CHEW      omeprazole (PRILOSEC) 20 MG capsule Take 20 mg by mouth daily.     traZODone (DESYREL) 100 MG tablet Take 1 tablet (100 mg total) by mouth at bedtime. 90 tablet 3   Fluticasone-Salmeterol (ADVAIR) 100-50 MCG/DOSE AEPB Inhale 1 puff into the lungs 2 (two) times daily as needed.     pantoprazole (PROTONIX) 20 MG tablet Take 1 tablet (20 mg total) by mouth daily.  30 tablet 3   No facility-administered medications prior to visit.    Review of Systems  Constitutional:  Negative for chills, fever, malaise/fatigue and weight loss.  HENT:  Negative for congestion, sinus pain and sore throat.   Eyes: Negative.   Respiratory:  Positive for cough and sputum production. Negative for hemoptysis, shortness of breath and wheezing.   Cardiovascular:  Negative for chest pain, palpitations, orthopnea, claudication and leg swelling.  Gastrointestinal:  Positive for heartburn. Negative for abdominal pain, nausea and vomiting.  Genitourinary: Negative.   Musculoskeletal:  Negative for myalgias.  Skin:  Negative for rash.  Neurological:  Negative for weakness.  Endo/Heme/Allergies: Negative.   Psychiatric/Behavioral: Negative.      Objective:   Vitals:   10/09/22 1458  BP: 116/74  Pulse: 72  SpO2: 98%  Weight: 164 lb (74.4 kg)  Height: 5' 3.25" (1.607 m)   Physical Exam Constitutional:      General: She is not in acute distress.    Appearance: She is not ill-appearing.  HENT:     Head: Normocephalic and atraumatic.  Eyes:     General: No scleral icterus.    Conjunctiva/sclera: Conjunctivae normal.  Cardiovascular:     Rate and Rhythm: Normal rate and regular rhythm.     Pulses: Normal pulses.     Heart sounds: Normal heart sounds. No murmur heard. Pulmonary:     Effort: Pulmonary effort is normal.     Breath sounds: Normal breath sounds. No wheezing, rhonchi or rales.  Musculoskeletal:     Right lower leg: No edema.      Left lower leg: No edema.  Skin:    General: Skin is warm and dry.  Neurological:     General: No focal deficit present.     Mental Status: She is alert.  Psychiatric:        Mood and Affect: Mood normal.        Behavior: Behavior normal.        Thought Content: Thought content normal.        Judgment: Judgment normal.    CBC    Component Value Date/Time   WBC 6.0 07/12/2022 1030   RBC 4.73 07/12/2022 1030   HGB 14.1 07/12/2022 1030   HCT 41.0 07/12/2022 1030   PLT 295.0 07/12/2022 1030   MCV 86.6 07/12/2022 1030   MCHC 34.4 07/12/2022 1030   RDW 12.6 07/12/2022 1030   LYMPHSABS 1.4 07/12/2022 1030   MONOABS 0.6 07/12/2022 1030   EOSABS 0.3 07/12/2022 1030   BASOSABS 0.0 07/12/2022 1030      Latest Ref Rng & Units 07/12/2022   10:30 AM 11/18/2021    1:02 PM 01/21/2021    2:09 PM  BMP  Glucose 70 - 99 mg/dL 104  112  121   BUN 6 - 23 mg/dL 15  11  10    Creatinine 0.40 - 1.20 mg/dL 0.66  0.80  0.76   Sodium 135 - 145 mEq/L 135  136  134   Potassium 3.5 - 5.1 mEq/L 3.9  3.8  4.0   Chloride 96 - 112 mEq/L 101  103  101   CO2 19 - 32 mEq/L 25  26  23    Calcium 8.4 - 10.5 mg/dL 9.1  9.2  9.2    Chest imaging: CXR 03/06/22 Improved opacities of the right mid lung.  CT Chest 11/29/21 Patchy ground-glass and adjacent focal clustered nodularity in the right upper lobe and adjacent band like atelectasis/scarring at the  junction of the right upper middle lobes. This is consistent with an infectious/inflammatory process. Recommend follow-up chest CT in 3 months to ensure resolution.   Multiple prominent mediastinal and axillary lymph nodes with an enlarged right hilar lymph node and left axillary lymph node. These are favored to be reactive, attention recommended on follow-up CT.   Additional 5 mm ground-glass nodular opacity in the left upper lobe, which may or may not be related to the aforementioned infectious/inflammatory process. This can be reassessed on  follow-up CT.  CXR 11/18/21 Chronic appearing increased lung markings with mild mid right lung and left basilar linear scarring and/or atelectasis.  PFT:     No data to display          Labs:  Path: Peripheral Smear 11/18/21 WBC are unremarkable. The overall findings in this smear are consistent with a reactive thrombocytosis. Clinical correlation is recommended. Anemia with RBCs which appear to be microcytic and hypochromic on smear review. Suggest evaluation for iron deficiency and/or a source of blood loss, if clinically indicated.   Echo:  Heart Catheterization:    Assessment & Plan:   Acute bronchitis, unspecified organism - Plan: predniSONE (DELTASONE) 10 MG tablet, azithromycin (ZITHROMAX) 250 MG tablet, fluticasone-salmeterol (ADVAIR DISKUS) 250-50 MCG/ACT AEPB  Mild intermittent asthma without complication - Plan: fluticasone-salmeterol (ADVAIR DISKUS) 250-50 MCG/ACT AEPB  Gastroesophageal reflux disease without esophagitis  Discussion: Lindsey Knight is a 61 year old woman, never smoker with history of asthma who returns to pulmonary clinic for cough.   She appears to have bronchitis vs cough in setting of on going GERD issues.   We will start her on 5 days of prednisone 71m daily and start a Zpak.   She is to use advair 250-526m 1 puff twice daily and as needed albuterol.   If she does not find improvement, then we will consider resuming her PPI therapy.  Follow up in 1 month via video visit.  Lindsey JacksonMD [facility] Office: [phone]  Current Outpatient Medications:    albuterol (VENTOLIN HFA) 108 (90 Base) MCG/ACT inhaler, Inhale 1 puff into the lungs every 6 (six) hours as needed., Disp: , Rfl:    alendronate (FOSAMAX) 70 MG tablet, Take 1 tablet (70 mg total) by mouth every 7 (seven) days. Take with a full glass of water on an empty stomach., Disp: 12 tablet, Rfl: 4   Ascorbic Acid (VITAMIN C CR) 500 MG TBCR, Take  1 tablet by mouth every other day., Disp: , Rfl:    azithromycin (ZITHROMAX) 250 MG tablet, Take as directed, Disp: 6 tablet, Rfl: 0   Cannabinoids (THC FREE PO), Take 2.5 mg by mouth daily in the afternoon., Disp: , Rfl:    Cholecalciferol (VITAMIN D3 ADULT GUMMIES PO), , Disp: , Rfl:    Cyanocobalamin (VITAMIN B-12 PO), Take 3,000 mcg by mouth 2 (two) times a week., Disp: , Rfl:    estradiol (ESTRACE VAGINAL) 0.1 MG/GM vaginal cream, Place 1 g vaginally 3 (three) times a week., Disp: 42.5 g, Rfl: 12   ferrous sulfate 324 MG TBEC, Take 324 mg by mouth every other day., Disp: , Rfl:    fexofenadine (ALLEGRA) 180 MG tablet, Take 180 mg by mouth daily., Disp: , Rfl:    fluticasone-salmeterol (ADVAIR DISKUS) 250-50 MCG/ACT AEPB, Inhale 1 puff into the lungs in the morning and at bedtime., Disp: 60 each, Rfl: 6   MAGNESIUM GLYCINATE PO, , Disp: , Rfl:    Melatonin 5  MG CHEW, Chew by mouth., Disp: , Rfl:    montelukast (SINGULAIR) 10 MG tablet, TAKE 1 TABLET BY MOUTH EVERYDAY AT BEDTIME, Disp: 90 tablet, Rfl: 3   Multiple Vitamins-Minerals (MULTIVITAMIN ADULT) CHEW, , Disp: , Rfl:    omeprazole (PRILOSEC) 20 MG capsule, Take 20 mg by mouth daily., Disp: , Rfl:    predniSONE (DELTASONE) 10 MG tablet, Take 4 tablets (40 mg total) by mouth daily with breakfast for 5 days., Disp: 20 tablet, Rfl: 0   traZODone (DESYREL) 100 MG tablet, Take 1 tablet (100 mg total) by mouth at bedtime., Disp: 90 tablet, Rfl: 3

## 2022-10-09 NOTE — Patient Instructions (Addendum)
Start prednisone 67m daily for 5 days  Start Zpak for 5 days  Use advair inhaler 250-578m 1 puff twice daily - rinse mouth out after each use  Use albuterol inhaler 1-2 puffs every 4-6 hours as needed  We will consider ramping back up your reflux therapy if cough persists.   Follow up in 1 month via video visit

## 2022-10-25 ENCOUNTER — Encounter: Payer: Self-pay | Admitting: Internal Medicine

## 2022-10-26 ENCOUNTER — Other Ambulatory Visit: Payer: Self-pay

## 2022-10-26 ENCOUNTER — Other Ambulatory Visit (INDEPENDENT_AMBULATORY_CARE_PROVIDER_SITE_OTHER): Payer: BC Managed Care – PPO

## 2022-10-26 ENCOUNTER — Encounter: Payer: Self-pay | Admitting: Internal Medicine

## 2022-10-26 DIAGNOSIS — D509 Iron deficiency anemia, unspecified: Secondary | ICD-10-CM | POA: Diagnosis not present

## 2022-10-26 LAB — IBC + FERRITIN
Ferritin: 72.6 ng/mL (ref 10.0–291.0)
Iron: 120 ug/dL (ref 42–145)
Saturation Ratios: 41 % (ref 20.0–50.0)
TIBC: 292.6 ug/dL (ref 250.0–450.0)
Transferrin: 209 mg/dL — ABNORMAL LOW (ref 212.0–360.0)

## 2022-10-26 LAB — CBC WITH DIFFERENTIAL/PLATELET
Basophils Absolute: 0 10*3/uL (ref 0.0–0.1)
Basophils Relative: 0.6 % (ref 0.0–3.0)
Eosinophils Absolute: 0.4 10*3/uL (ref 0.0–0.7)
Eosinophils Relative: 4.9 % (ref 0.0–5.0)
HCT: 42.2 % (ref 36.0–46.0)
Hemoglobin: 14.6 g/dL (ref 12.0–15.0)
Lymphocytes Relative: 27.8 % (ref 12.0–46.0)
Lymphs Abs: 2 10*3/uL (ref 0.7–4.0)
MCHC: 34.5 g/dL (ref 30.0–36.0)
MCV: 89.7 fl (ref 78.0–100.0)
Monocytes Absolute: 0.6 10*3/uL (ref 0.1–1.0)
Monocytes Relative: 8.2 % (ref 3.0–12.0)
Neutro Abs: 4.2 10*3/uL (ref 1.4–7.7)
Neutrophils Relative %: 58.5 % (ref 43.0–77.0)
Platelets: 329 10*3/uL (ref 150.0–400.0)
RBC: 4.7 Mil/uL (ref 3.87–5.11)
RDW: 12.8 % (ref 11.5–15.5)
WBC: 7.1 10*3/uL (ref 4.0–10.5)

## 2022-10-29 ENCOUNTER — Encounter: Payer: Self-pay | Admitting: Physician Assistant

## 2022-10-30 ENCOUNTER — Other Ambulatory Visit: Payer: Self-pay | Admitting: Physician Assistant

## 2022-10-30 MED ORDER — TRAZODONE HCL 100 MG PO TABS: 100.0000 mg | ORAL_TABLET | Freq: Every day | ORAL | 3 refills | Status: DC

## 2022-10-31 ENCOUNTER — Telehealth (INDEPENDENT_AMBULATORY_CARE_PROVIDER_SITE_OTHER): Payer: BC Managed Care – PPO | Admitting: Pulmonary Disease

## 2022-10-31 DIAGNOSIS — J4520 Mild intermittent asthma, uncomplicated: Secondary | ICD-10-CM | POA: Diagnosis not present

## 2022-10-31 NOTE — Progress Notes (Unsigned)
Virtual Visit via Video Note  I connected with Lindsey Knight on 10/31/22 at  3:45 PM EST by a video enabled telemedicine application and verified that I am speaking with the correct person using two identifiers.  Location: Patient: home Provider: clinic   I discussed the limitations of evaluation and management by telemedicine and the availability of in person appointments. The patient expressed understanding and agreed to proceed.  History of Present Illness: Lindsey Knight is a 61 year old woman, never smoker with history of asthma who returns to pulmonary clinic for cough.    She appears to have bronchitis vs cough in setting of on going GERD issues.    We will start her on 5 days of prednisone 11m daily and start a Zpak.    She is to use advair 250-578m 1 puff twice daily and as needed albuterol.    If she does not find improvement, then we will consider resuming her PPI therapy.   Follow up in 1 month via video visit.   Observations/Objective:   Assessment and Plan:   Follow Up Instructions:    I discussed the assessment and treatment plan with the patient. The patient was provided an opportunity to ask questions and all were answered. The patient agreed with the plan and demonstrated an understanding of the instructions.   The patient was advised to call back or seek an in-person evaluation if the symptoms worsen or if the condition fails to improve as anticipated.  I provided *** minutes of non-face-to-face time during this encounter.   JoFreddi StarrMD

## 2022-10-31 NOTE — Patient Instructions (Signed)
Use advair inhaler 1 puff twice daily - rinse mouth out after each use -recommend placing it by your toothbrush as a reminder  Use albuterol inhaler as needed  Follow up as scheduled this Spring, call sooner if needed

## 2022-11-02 ENCOUNTER — Encounter: Payer: Self-pay | Admitting: Pulmonary Disease

## 2022-11-16 ENCOUNTER — Encounter: Payer: Self-pay | Admitting: Internal Medicine

## 2023-01-02 NOTE — Progress Notes (Signed)
Lindsey Knight is a 62 y.o. female here for a new problem.  History of Present Illness:   No chief complaint on file.   HPI  Acute Sinusitis     Past Medical History:  Diagnosis Date   Allergy Child   Anemia    Arthritis    Asthma    GERD (gastroesophageal reflux disease) 10/22   Osteoporosis    Ulcer 6/23   Vaginal delivery    x 2, 1985, 1990     Social History   Tobacco Use   Smoking status: Never    Passive exposure: Never   Smokeless tobacco: Never  Vaping Use   Vaping Use: Never used  Substance Use Topics   Alcohol use: Yes    Alcohol/week: 2.0 standard drinks of alcohol    Types: 2 Standard drinks or equivalent per week   Drug use: Never    Past Surgical History:  Procedure Laterality Date   COLONOSCOPY  01/23/2022   normal - 2012   NASAL SINUS SURGERY Bilateral 2015    Family History  Problem Relation Age of Onset   Colon cancer Mother    Cancer Mother    Hearing loss Mother    Heart failure Father    Asthma Father    COPD Father    Hearing loss Father    Crohn's disease Sister    Pancreatic cancer Other    Ovarian cancer Other    Uterine cancer Other     Allergies  Allergen Reactions   Tape Rash    Current Medications:   Current Outpatient Medications:    albuterol (VENTOLIN HFA) 108 (90 Base) MCG/ACT inhaler, Inhale 1 puff into the lungs every 6 (six) hours as needed., Disp: , Rfl:    alendronate (FOSAMAX) 70 MG tablet, Take 1 tablet (70 mg total) by mouth every 7 (seven) days. Take with a full glass of water on an empty stomach., Disp: 12 tablet, Rfl: 4   Ascorbic Acid (VITAMIN C CR) 500 MG TBCR, Take 1 tablet by mouth every other day., Disp: , Rfl:    Cannabinoids (THC FREE PO), Take 2.5 mg by mouth daily in the afternoon., Disp: , Rfl:    Cholecalciferol (VITAMIN D3 ADULT GUMMIES PO), , Disp: , Rfl:    Cyanocobalamin (VITAMIN B-12 PO), Take 3,000 mcg by mouth 2 (two) times a week., Disp: , Rfl:    estradiol (ESTRACE VAGINAL) 0.1  MG/GM vaginal cream, Place 1 g vaginally 3 (three) times a week., Disp: 42.5 g, Rfl: 12   ferrous sulfate 324 MG TBEC, Take 324 mg by mouth every other day., Disp: , Rfl:    fexofenadine (ALLEGRA) 180 MG tablet, Take 180 mg by mouth daily., Disp: , Rfl:    fluticasone-salmeterol (ADVAIR DISKUS) 250-50 MCG/ACT AEPB, Inhale 1 puff into the lungs in the morning and at bedtime., Disp: 60 each, Rfl: 6   MAGNESIUM GLYCINATE PO, , Disp: , Rfl:    Melatonin 5 MG CHEW, Chew by mouth., Disp: , Rfl:    montelukast (SINGULAIR) 10 MG tablet, TAKE 1 TABLET BY MOUTH EVERYDAY AT BEDTIME, Disp: 90 tablet, Rfl: 3   Multiple Vitamins-Minerals (MULTIVITAMIN ADULT) CHEW, , Disp: , Rfl:    traZODone (DESYREL) 100 MG tablet, Take 1 tablet (100 mg total) by mouth at bedtime., Disp: 90 tablet, Rfl: 3   Review of Systems:   ROS  Vitals:   There were no vitals filed for this visit.   There is no height or weight on  file to calculate BMI.  Physical Exam:   Physical Exam  Assessment and Plan:   ***   I,Alexander Ruley,acting as a scribe for [name], PA.,have documented all relevant documentation on the behalf of [name], PA,as directed by  [name], PA while in the presence of [name], [place].   ***  [name], PA-C

## 2023-01-03 ENCOUNTER — Ambulatory Visit: Payer: BC Managed Care – PPO | Admitting: Physician Assistant

## 2023-01-03 ENCOUNTER — Encounter: Payer: Self-pay | Admitting: Physician Assistant

## 2023-01-03 VITALS — BP 128/76 | HR 100 | Temp 98.2°F | Ht 63.25 in | Wt 166.0 lb

## 2023-01-03 DIAGNOSIS — L989 Disorder of the skin and subcutaneous tissue, unspecified: Secondary | ICD-10-CM | POA: Diagnosis not present

## 2023-01-03 DIAGNOSIS — J0110 Acute frontal sinusitis, unspecified: Secondary | ICD-10-CM | POA: Diagnosis not present

## 2023-01-03 DIAGNOSIS — R222 Localized swelling, mass and lump, trunk: Secondary | ICD-10-CM | POA: Diagnosis not present

## 2023-01-03 MED ORDER — BENZONATATE 100 MG PO CAPS: 100.0000 mg | ORAL_CAPSULE | Freq: Three times a day (TID) | ORAL | 1 refills | Status: DC | PRN

## 2023-01-03 MED ORDER — LEVOFLOXACIN 500 MG PO TABS: 500.0000 mg | ORAL_TABLET | Freq: Every day | ORAL | 0 refills | Status: AC

## 2023-01-03 NOTE — Patient Instructions (Signed)
It was great to see you!  Start regular use of over the counter antihistamines such as Zyrtec (cetirizine), Claritin (loratadine), Allegra (fexofenadine), or Xyzal (levocetirizine) daily as needed. May take twice a day if needed as long as it does not cause drowsiness. Start Nasal saline spray (i.e., Simply Saline) or nasal saline lavage (i.e., NeilMed) is recommended as needed and prior to medicated nasal sprays. Continue Flonase 1 spray twice a day. Use tessalon perles as needed during the day for cough (keep away from children - can be toxic!). If tessalon perles ineffective, trial 12-hour delsym over the counter cough syrup (generic is fine!)   Levaquin antibiotic has been sent in  Dermatology and surgery referrals placed  Keep me posted on your symptoms and if you need anything else.

## 2023-02-06 IMAGING — DX DG CHEST 2V
2 series · 2 of 2 positions shown · non-contrast
Comparison: Chest CT 11/29/2021; chest radiograph 11/18/2021.

CLINICAL DATA: Follow-up pneumonia.

EXAM:
CHEST - 2 VIEW

[chest pa]
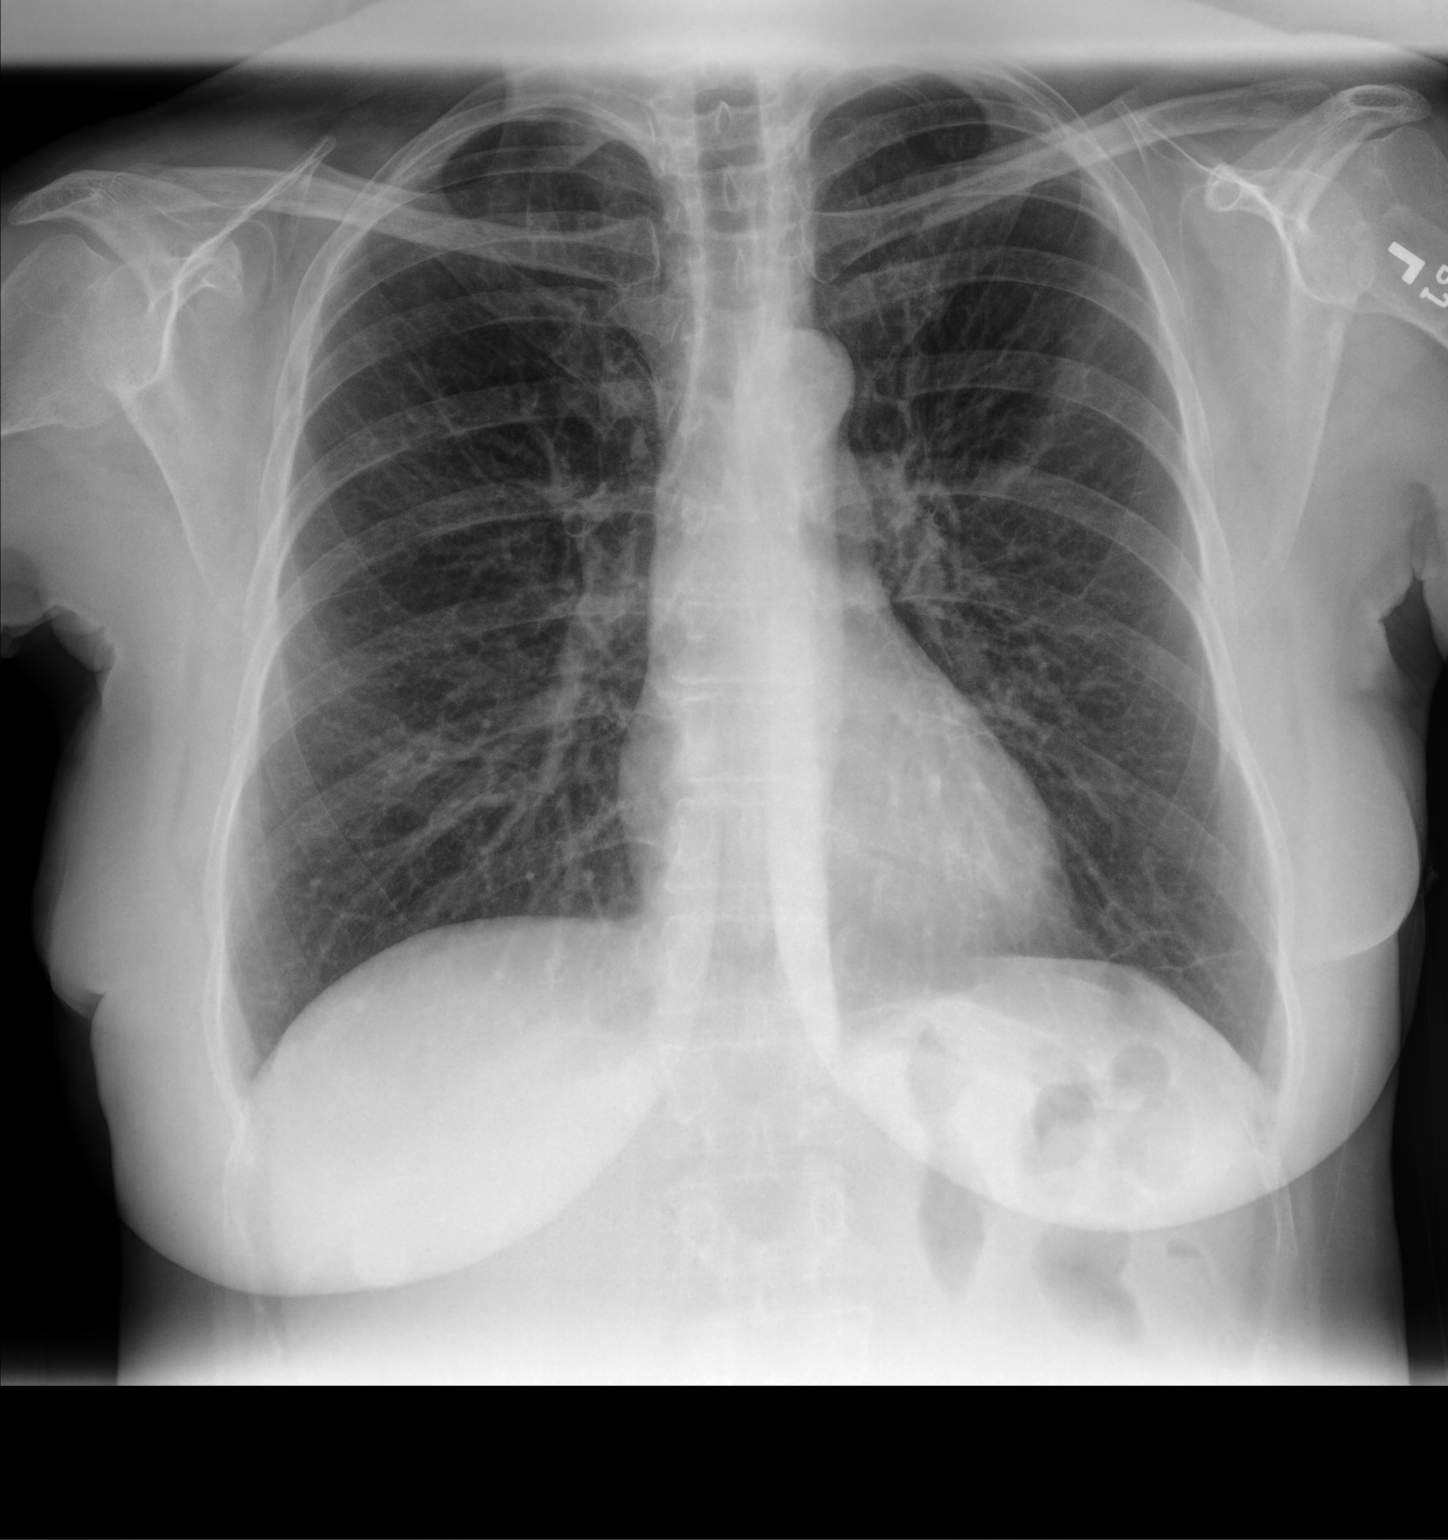

[chest lat]
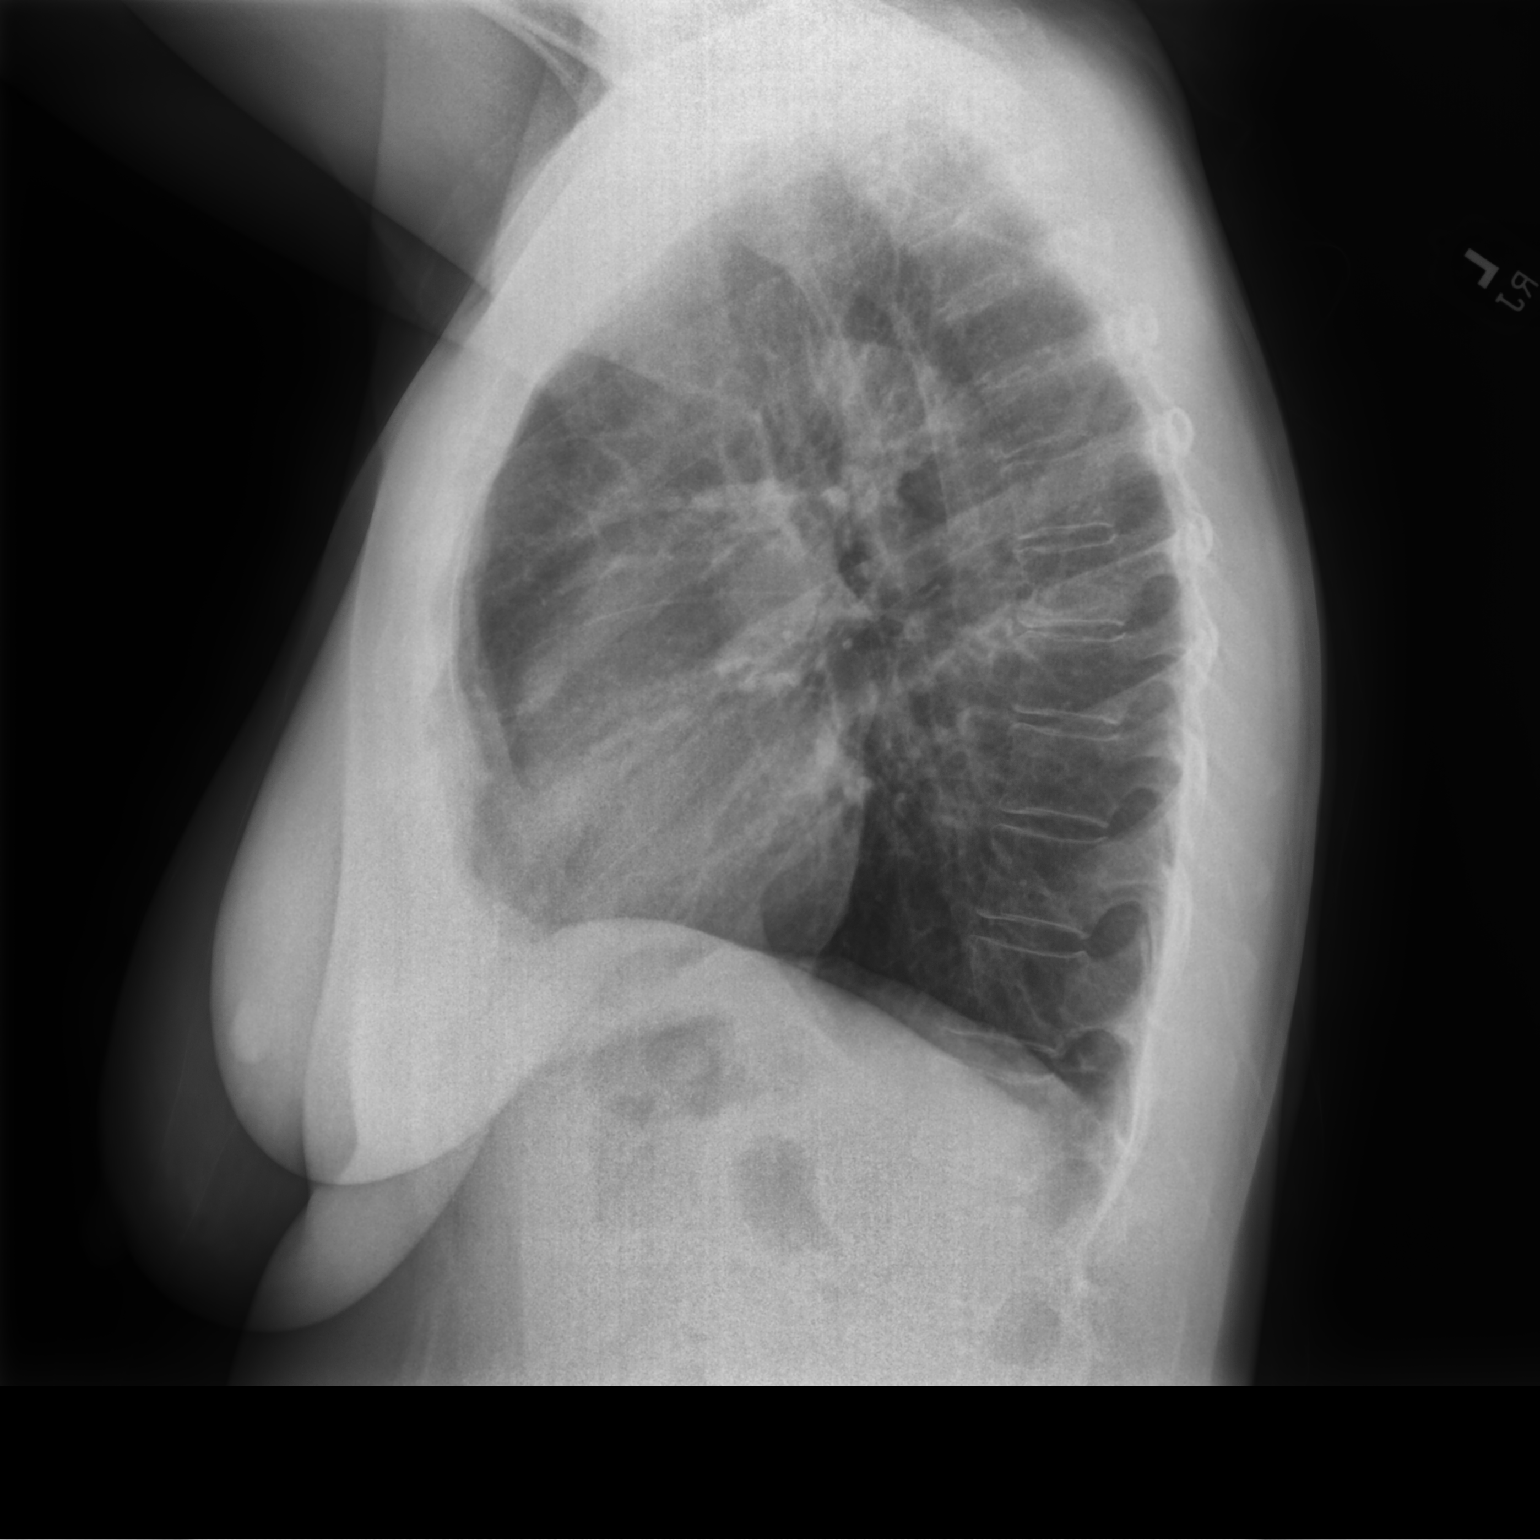

[2 of 2 positions shown; findings below may reference images not displayed]

FINDINGS: Stable cardiac and mediastinal contours. Interval resolution of
previously visualized focal consolidation right upper lung. No
pleural effusion or pneumothorax. Osseous structures unremarkable.
IMPRESSION: Interval resolution of previously described focal opacity right
upper lung.

As per chest CT report 11/29/2021, recommend follow-up chest CT as
well to assess the adenopathy and pulmonary nodularity.

## 2023-02-19 ENCOUNTER — Ambulatory Visit: Payer: BC Managed Care – PPO | Admitting: Dermatology

## 2023-02-19 ENCOUNTER — Encounter: Payer: Self-pay | Admitting: Dermatology

## 2023-02-19 VITALS — BP 130/86

## 2023-02-19 DIAGNOSIS — D171 Benign lipomatous neoplasm of skin and subcutaneous tissue of trunk: Secondary | ICD-10-CM

## 2023-02-19 DIAGNOSIS — H0265 Xanthelasma of left lower eyelid: Secondary | ICD-10-CM

## 2023-02-19 DIAGNOSIS — H0263 Xanthelasma of right eye, unspecified eyelid: Secondary | ICD-10-CM

## 2023-02-19 NOTE — Progress Notes (Unsigned)
   New Patient Visit   Subjective  Lindsey Knight is a 62 y.o. female who presents for the following: Spot  The patient has spots, moles and lesions to be evaluated, some may be new or changing and the patient has concerns that these could be cancer.  Patient with a spot at left lower eyelid, present for a few months. Bothersome for patient. She had an ultrasound for a spot at her back and was told that is likely a lipoma. Referral has been sent for general surgery.   No personal or fhx skin cancer.   The following portions of the chart were reviewed this encounter and updated as appropriate: medications, allergies, medical history  Review of Systems:  No other skin or systemic complaints except as noted in HPI or Assessment and Plan.  Objective  Well appearing patient in no apparent distress; mood and affect are within normal limits.  A focused examination was performed of the following areas: Face, back Relevant physical exam findings are noted in the Assessment and Plan.    Assessment & Plan   Lipoma  Exam: Subcutaneous rubbery nodule(s) 6.0 x 4.5 cm Location: mid back  Symptomatic for patient. She prefers not to see general surgery and would like to see Dr. Nehemiah Massed for excision. Patient advised he is scheduling a few months out.   Benign-appearing. Exam most consistent with a lipoma. Discussed that a lipoma is a benign fatty growth that can grow over time and sometimes get irritated. Recommend observation if it is not bothersome or changing. Discussed option of ILK injections or surgical excision to remove it if it is growing, symptomatic, or other changes noted. Please call for new or changing lesions so they can be evaluated.      Xanthelasma   Patient advised benign.  Recommend patient see oculoplastics if she would like removed.      Return for TBSE.  Graciella Belton, RMA, am acting as scribe for Ellard Artis, MD .   Documentation: I have reviewed the  above documentation for accuracy and completeness, and I agree with the above.  Ellard Artis, MD

## 2023-02-19 NOTE — Patient Instructions (Signed)
Due to recent changes in healthcare laws, you may see results of your pathology and/or laboratory studies on MyChart before the doctors have had a chance to review them. We understand that in some cases there may be results that are confusing or concerning to you. Please understand that not all results are received at the same time and often the doctors may need to interpret multiple results in order to provide you with the best plan of care or course of treatment. Therefore, we ask that you please give us 2 business days to thoroughly review all your results before contacting the office for clarification. Should we see a critical lab result, you will be contacted sooner.   If You Need Anything After Your Visit  If you have any questions or concerns for your doctor, please call our main line at [phone] If no one answers, please leave a voicemail as directed and we will return your call as soon as possible. Messages left after 4 pm will be answered the following business day.   You may also send us a message via MyChart. We typically respond to MyChart messages within 1-2 business days.  For prescription refills, please ask your pharmacy to contact our office. Our fax number is [phone].  If you have an urgent issue when the clinic is closed that cannot wait until the next business day, you can page your doctor at the number below.    Please note that while we do our best to be available for urgent issues outside of office hours, we are not available 24/7.   If you have an urgent issue and are unable to reach us, you may choose to seek medical care at your doctor's office, retail clinic, urgent care center, or emergency room.  If you have a medical emergency, please immediately call 911 or go to the emergency department. In the event of inclement weather, please call our main line at [phone] for an update on the status of any delays or closures.  Dermatology Medication Tips: Please  keep the boxes that topical medications come in in order to help keep track of the instructions about where and how to use these. Pharmacies typically print the medication instructions only on the boxes and not directly on the medication tubes.   If your medication is too expensive, please contact our office at [phone] or send us a message through MyChart.   We are unable to tell what your co-pay for medications will be in advance as this is different depending on your insurance coverage. However, we may be able to find a substitute medication at lower cost or fill out paperwork to get insurance to cover a needed medication.   If a prior authorization is required to get your medication covered by your insurance company, please allow us 1-2 business days to complete this process.  Drug prices often vary depending on where the prescription is filled and some pharmacies may offer cheaper prices.  The website www.goodrx.com contains coupons for medications through different pharmacies. The prices here do not account for what the cost may be with help from insurance (it may be cheaper with your insurance), but the website can give you the price if you did not use any insurance.  - You can print the associated coupon and take it with your prescription to the pharmacy.  - You may also stop by our office during regular business hours and pick up a GoodRx coupon card.  - If you need your   prescription sent electronically to a different pharmacy, notify our office through [facility] MyChart or by phone at [phone]     

## 2023-02-22 ENCOUNTER — Ambulatory Visit: Payer: BC Managed Care – PPO | Admitting: Family Medicine

## 2023-02-22 NOTE — Progress Notes (Deleted)
   Shirlyn Goltz, PhD, LAT, ATC acting as a scribe for Lynne Leader, MD.  Lindsey Knight is a 62 y.o. female who presents to [facility] today for re-occurring R thumb pain and triggering. Pt is R-hand dominate. Pt was last seen by Dr. Georgina Snell on 07/13/22 and was given a R 1st MCP steroid injection. Today, pt reports ***   Pertinent review of systems: ***  Relevant historical information: ***   Exam:  There were no vitals taken for this visit. General: Well Developed, well nourished, and in no acute distress.   MSK: ***    Lab and Radiology Results No results found for this or any previous visit (from the past 72 hour(s)). No results found.     Assessment and Plan: 62 y.o. female with ***   PDMP not reviewed this encounter. No orders of the defined types were placed in this encounter.  No orders of the defined types were placed in this encounter.    Discussed warning signs or symptoms. Please see discharge instructions. Patient expresses understanding.   ***

## 2023-02-23 NOTE — Progress Notes (Unsigned)
Rubin Payor, PhD, LAT, ATC acting as a scribe for Clementeen Graham, MD.  Lindsey Knight is a 62 y.o. female who presents to [facility] today for re-occurring R thumb pain and triggering. Pt is R-hand dominate. Pt was last seen by Dr. [name] on 07/13/22 and was given a R 1st MCP steroid injection. Today, pt reports worsening sx over the past couple of weeks. The thumb has not locked up yet. Having difficulty with grasping, pain and weakness. Some swelling.   Additionally she has general hand pain and swelling and stiffness.  She is concerned she has arthritis.   Pertinent review of systems: No fevers or chills  Relevant historical information: Trigger thumb   Exam:  BP (!) 140/80   Pulse 81   Ht 5' 3.25" (1.607 m)   Wt 168 lb 3.2 oz (76.3 kg)   SpO2 97%   BMI 29.56 kg/m  General: Well Developed, well nourished, and in no acute distress.   MSK: Right hand mild degenerative changes visible.  Triggering present with flexion of thumb IP joint. Strength is intact.    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 Await formal radiology review  Procedure: Real-time Ultrasound Guided Injection of right first digit flexor tendon at A1 pulley (trigger thumb injection) Device: Philips Affiniti 50G Images permanently stored and available for review in PACS Verbal informed consent obtained.  Discussed risks and benefits of procedure. Warned about infection, bleeding, hyperglycemia damage to structures among others. Patient expresses understanding and agreement Time-out conducted.   Noted no overlying erythema, induration, or other signs of local infection.   Skin prepped in a sterile fashion.   Local anesthesia: Topical Ethyl chloride.   With sterile technique and under real time ultrasound guidance: 40 mg of Kenalog and 1 mL of lidocaine injected into tendon  sheath and A1 pulley. Fluid seen entering the tendon sheath.   Completed without difficulty   Pain immediately resolved suggesting accurate placement of the medication.   Advised to call if fevers/chills, erythema, induration, drainage, or persistent bleeding.   Images permanently stored and available for review in the ultrasound unit.  Impression: Technically successful ultrasound guided injection.       Assessment and Plan: 62 y.o. female with right hand pain.  Patient has recurrent trigger thumb.  That was injected today.  Additionally she has more general hand pain and swelling that is thought to be DJD.  Plan for trial of hand therapy.  Consider rheumatologic workup in the future if needed.  She does have a history of Crohn's disease.   PDMP not reviewed this encounter. Orders Placed This Encounter  Procedures   [place] LIMITED JOINT SPACE STRUCTURES UP RIGHT(NO LINKED CHARGES)    Order Specific Question:   Reason for Exam (SYMPTOM  OR DIAGNOSIS REQUIRED)    Answer:   right thumb pain    Order Specific Question:   Preferred imaging location?    Answer:   [facility] Hand Complete Right    Standing Status:   Future    Number of Occurrences:   1    Standing Expiration Date:   02/26/2024    Order Specific Question:   Reason for Exam (SYMPTOM  OR DIAGNOSIS REQUIRED)    Answer:   eval hand pain and DJD    Order Specific Question:   Preferred imaging location?  Answer:   [facility]   Ambulatory referral to Occupational Therapy    Referral Priority:   Routine    Referral Type:   Occupational Therapy    Referral Reason:   Specialty Services Required    Requested Specialty:   Occupational Therapy    Number of Visits Requested:   1   No orders of the defined types were placed in this encounter.    Discussed warning signs or symptoms. Please see discharge instructions. Patient expresses understanding.   The above documentation has been  reviewed and is accurate and complete Clementeen Graham, M.D.

## 2023-02-26 ENCOUNTER — Encounter: Payer: Self-pay | Admitting: Family Medicine

## 2023-02-26 ENCOUNTER — Ambulatory Visit: Payer: BC Managed Care – PPO | Admitting: Family Medicine

## 2023-02-26 ENCOUNTER — Ambulatory Visit (INDEPENDENT_AMBULATORY_CARE_PROVIDER_SITE_OTHER): Payer: BC Managed Care – PPO

## 2023-02-26 ENCOUNTER — Ambulatory Visit: Payer: Self-pay

## 2023-02-26 VITALS — BP 140/80 | HR 81 | Ht 63.25 in | Wt 168.2 lb

## 2023-02-26 DIAGNOSIS — M79644 Pain in right finger(s): Secondary | ICD-10-CM | POA: Diagnosis not present

## 2023-02-26 DIAGNOSIS — M79641 Pain in right hand: Secondary | ICD-10-CM

## 2023-02-26 DIAGNOSIS — M19041 Primary osteoarthritis, right hand: Secondary | ICD-10-CM | POA: Diagnosis not present

## 2023-02-26 DIAGNOSIS — G8929 Other chronic pain: Secondary | ICD-10-CM | POA: Diagnosis not present

## 2023-02-26 DIAGNOSIS — M65311 Trigger thumb, right thumb: Secondary | ICD-10-CM | POA: Diagnosis not present

## 2023-02-26 NOTE — Patient Instructions (Addendum)
Thank you for coming in today.  You received an injection today. Seek immediate medical attention if the joint becomes red, extremely painful, or is oozing fluid.  Please get an Xray today before you leave  A referral has been placed to [facility] for [facility]ational Therapy.

## 2023-02-27 NOTE — Progress Notes (Signed)
Right hand x-ray shows some arthritis at the thumb.

## 2023-03-06 ENCOUNTER — Telehealth: Payer: Self-pay

## 2023-03-06 NOTE — Telephone Encounter (Signed)
Patient called with some questions regarding upcoming surgery.  Patient received surgery letter and went over information with patient.  Patient has asked if you can provide her with codes that will be used so she could get a estimate of cost from [facility]?

## 2023-03-06 NOTE — Therapy (Signed)
OUTPATIENT OCCUPATIONAL THERAPY ORTHO EVALUATION  Patient Name: Lindsey Knight MRN: [id number] DOB:[birth year], 62 y.o., female Today's Date: 03/08/2023  PCP: Jarold Motto, PA REFERRING PROVIDER:  Rodolph Bong, MD    END OF SESSION:  OT End of Session - 03/08/23 0845     Visit Number 1    Number of Visits 8    Date for OT Re-Evaluation 04/20/23    Authorization Type BCBS    OT Start Time 0845    OT Stop Time 0934    OT Time Calculation (min) 49 min    Activity Tolerance Patient tolerated treatment well;No increased pain;Patient limited by pain;Patient limited by fatigue    Behavior During Therapy [facility] for tasks assessed/performed             Past Medical History:  Diagnosis Date   Allergy Child   Anemia    Arthritis    Asthma    GERD (gastroesophageal reflux disease) 10/22   Osteoporosis    Ulcer 6/23   Vaginal delivery    x 2, 1985, 1990   Past Surgical History:  Procedure Laterality Date   COLONOSCOPY  01/23/2022   normal - 2012   NASAL SINUS SURGERY Bilateral 2015   Patient Active Problem List   Diagnosis Date Noted   Trigger thumb, right thumb 07/13/2022   Colon cancer 01/03/2022   Crohn's disease 12/30/2021    ONSET DATE: acute on chronic   REFERRING DIAG:  M79.644,G89.29 (ICD-10-CM) - Chronic pain of right thumb  M65.311 (ICD-10-CM) - Trigger thumb, right thumb  M79.641,G89.29 (ICD-10-CM) - Chronic hand pain, right    THERAPY DIAG:  Muscle weakness (generalized)  Pain in joint of right hand  Stiffness of right wrist, not elsewhere classified  Other lack of coordination  Rationale for Evaluation and Treatment: Rehabilitation  SUBJECTIVE:   SUBJECTIVE STATEMENT: 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 She states gripping, BADLs, IADLs all hurt her hand/thumb.   PERTINENT HISTORY: Per MD  notes: "Patient has recurrent trigger thumb. That was injected today. Additionally  she has more general hand pain and swelling that is thought to be DJD. Plan for trial of hand therapy. Consider rheumatologic workup in the future if needed. She does have a history of Crohn's disease.    PRECAUTIONS: Other: mild irritation from tape  WEIGHT BEARING RESTRICTIONS: No  PAIN:  Are you having pain? Not today at rest, but in Rt thumb volar MCPJ, worse with motion, at the end of the day, strong gripping, etc. Up to 5-6/10 at worst before recent injection  FALLS: Has patient fallen in last 6 months? Yes. Number of falls "maybe once or twice" she states this is likely just distracted   LIVING ENVIRONMENT: Lives with: lives with their family  PLOF: Independent  PATIENT GOALS: To be able to use Rt hand well, without pain to open jars, etc.    OBJECTIVE: (All objective assessments below are from initial evaluation on: 03/08/23 unless otherwise specified.)   HAND DOMINANCE: Right   ADLs: Overall ADLs: States decreased ability to grab, hold household objects, pain and inability to open containers, perform FMS tasks (manipulate fasteners on clothing)  FUNCTIONAL OUTCOME MEASURES: Eval: Quck DASH 18% impairment today  (Higher % Score  =  More Impairment)     UPPER EXTREMITY ROM     Shoulder to Wrist AROM Right eval  Wrist flexion 57 (60* Lt)   Wrist extension  61* 65* Lt)   (Blank rows = not tested)   Hand AROM Right eval  Full Fist Ability (or Gap to Distal Palmar Crease) full  Thumb Opposition  (Kapandji Scale)  10 tender  Thumb MCP (0-60) 67 (54)  Thumb IP (0-80) 59 (64)  Thumb Radial Abduction Span ~37mm span bil  (Blank rows = not tested)   UPPER EXTREMITY MMT:      MMT Right 03/08/23  Thumb resisted ext, flex, abd 4+/5 at least and not very painful  (Blank rows = not tested)  HAND FUNCTION: Eval: Observed weakness in affected hand.  Grip strength Right: 34.6 lbs, Left: 41 lbs   COORDINATION: Eval: Observed coordination impairments with affected  hand. 9 Hole Peg Test Right: 25.5sec, Left: 24.5 sec   SENSATION: Eval:  Light touch intact today EDEMA:   Eval: Palpable knot in FPL of Rt thumb at MCP J volarly (compared to Lt)   COGNITION: Eval: Overall cognitive status: WFL for evaluation today   OBSERVATIONS:   Eval: Palpable trigger felt in Rt thumb A1 pully with passive and active motion at IP J   TODAY'S TREATMENT:  Post-evaluation treatment: She was given initial education to avoid strong painful gripping and or repetitive motion at the thumb especially the IP joint.  OT then teachers of the following stretches to help improve general hand arthritis and thumb symptoms.  OT also uses flexible strapping material to create a right thumb IP joint blocking strap, and this works to reduce her pain and IP motion.  She is educated to wear this at all times as tolerated but not restrict her perfusion.  If this does not work well for her, OT will make her a few custom orthotic options next session to reduce pain and limited motion at the IP joint.   Exercises - HOOK Stretch  - 4 x daily - 3-5 reps - 15-20 sec hold - Stretch Thumb DOWNWARD  - 3 x daily - 3 reps - 15 sec hold - Thumb Webspace Stretch  - 3 x daily - 3 reps - 15 sec hold    PATIENT EDUCATION: Education details: See tx section above for details  Person educated: Patient Education method: Verbal Instruction, Teach back, Handouts  Education comprehension: States and demonstrates understanding, Additional Education required    HOME EXERCISE PROGRAM: Access Code: [email or web address] URL: [email or web address] Date: 03/08/2023 Prepared by: Fannie Knee   GOALS: Goals reviewed with patient? Yes   SHORT TERM GOALS: (STG required if POC>30 days) Target Date: 03/23/23  Pt will obtain protective, custom orthotic. Goal status: TBD/PRN  2.  Pt will demo/state understanding of initial HEP to improve pain levels and prerequisite motion. Goal status:  INITIAL   LONG TERM GOALS: Target Date: 04/20/23  Pt will improve functional ability by decreased impairment per Quick DASH assessment from 18% to 10% or better, for better quality of life. Goal status: INITIAL  2.  Pt will improve grip strength in Rt hand from 34lbs painful to at least 40lbs for functional use at home and in IADLs. Goal status: INITIAL  3.  Pt will improve A/ROM in Tr wrist flex/ext to at least 65* each, to have functional motion for tasks like reach and grasp.  Goal status: INITIAL  4.  Pt will improve coordination skills in Rt hand, as seen by better score on 9HPT testing to <24sec to have increased functional ability to carry out fine motor tasks (fasteners, etc.) and more complex, coordinated IADLs (meal prep, sports, etc.).  Goal status: INITIAL   ASSESSMENT:  CLINICAL IMPRESSION: Patient is a 62 y.o. female who was seen today for occupational therapy evaluation for triggering right thumb pain at the MCP joint, stiffness and arthritis in the hand and weakness as well as decreased coordination all leading to decreased ability with daily tasks.  She will benefit from outpatient occupational therapy to increase quality of life.   PERFORMANCE DEFICITS: in functional skills including ADLs, IADLs, coordination, dexterity, ROM, strength, pain, fascial restrictions, flexibility, Fine motor control, body mechanics, endurance, decreased knowledge of precautions, and UE functional use, cognitive skills including problem solving and safety awareness, and psychosocial skills including coping strategies, environmental adaptation, habits, and routines and behaviors.   IMPAIRMENTS: are limiting patient from ADLs, IADLs, work, and leisure.   COMORBIDITIES: may have co-morbidities  that affects occupational performance. Patient will benefit from skilled OT to address above impairments and improve overall function.  MODIFICATION OR ASSISTANCE TO COMPLETE EVALUATION: No modification  of tasks or assist necessary to complete an evaluation.  OT OCCUPATIONAL PROFILE AND HISTORY: Problem focused assessment: Including review of records relating to presenting problem.  CLINICAL DECISION MAKING: LOW - limited treatment options, no task modification necessary  REHAB POTENTIAL: Excellent  EVALUATION COMPLEXITY: Low      PLAN:  OT FREQUENCY: 1-2x/week  OT DURATION: 6 weeks  PLANNED INTERVENTIONS: self care/ADL training, therapeutic exercise, therapeutic activity, neuromuscular re-education, manual therapy, splinting, ultrasound, compression bandaging, moist heat, cryotherapy, contrast bath, patient/family education, coping strategies training, DME and/or AE instructions, and Dry needling  RECOMMENDED OTHER SERVICES: none now   CONSULTED AND AGREED WITH PLAN OF CARE: Patient  PLAN FOR NEXT SESSION: Review problems with bending the IP joint of review home exercise program continue to monitor pain and symptoms and perhaps make custom orthotics if necessary  Fannie Knee, OTR/L, CHT 03/08/2023, 1:28 PM

## 2023-03-08 ENCOUNTER — Other Ambulatory Visit: Payer: Self-pay

## 2023-03-08 ENCOUNTER — Encounter: Payer: Self-pay | Admitting: Rehabilitative and Restorative Service Providers"

## 2023-03-08 ENCOUNTER — Ambulatory Visit: Payer: BC Managed Care – PPO | Admitting: Rehabilitative and Restorative Service Providers"

## 2023-03-08 DIAGNOSIS — R278 Other lack of coordination: Secondary | ICD-10-CM | POA: Diagnosis not present

## 2023-03-08 DIAGNOSIS — M25631 Stiffness of right wrist, not elsewhere classified: Secondary | ICD-10-CM

## 2023-03-08 DIAGNOSIS — M6281 Muscle weakness (generalized): Secondary | ICD-10-CM | POA: Diagnosis not present

## 2023-03-08 DIAGNOSIS — M25541 Pain in joints of right hand: Secondary | ICD-10-CM

## 2023-03-15 ENCOUNTER — Encounter: Payer: BC Managed Care – PPO | Admitting: Rehabilitative and Restorative Service Providers"

## 2023-03-20 ENCOUNTER — Ambulatory Visit: Payer: BC Managed Care – PPO | Admitting: Pulmonary Disease

## 2023-03-22 ENCOUNTER — Ambulatory Visit: Payer: BC Managed Care – PPO | Admitting: Radiology

## 2023-03-27 ENCOUNTER — Telehealth: Payer: Self-pay

## 2023-03-27 ENCOUNTER — Ambulatory Visit (INDEPENDENT_AMBULATORY_CARE_PROVIDER_SITE_OTHER): Payer: BC Managed Care – PPO | Admitting: Dermatology

## 2023-03-27 VITALS — BP 129/77

## 2023-03-27 DIAGNOSIS — D171 Benign lipomatous neoplasm of skin and subcutaneous tissue of trunk: Secondary | ICD-10-CM

## 2023-03-27 DIAGNOSIS — D492 Neoplasm of unspecified behavior of bone, soft tissue, and skin: Secondary | ICD-10-CM

## 2023-03-27 HISTORY — PX: LIPOMA EXCISION: SHX5283

## 2023-03-27 MED ORDER — MUPIROCIN 2 % EX OINT: 1.0000 | TOPICAL_OINTMENT | Freq: Every day | CUTANEOUS | 1 refills | Status: DC

## 2023-03-27 MED ORDER — DOXYCYCLINE MONOHYDRATE 100 MG PO CAPS: 100.0000 mg | ORAL_CAPSULE | Freq: Two times a day (BID) | ORAL | 0 refills | Status: AC

## 2023-03-27 NOTE — Patient Instructions (Addendum)

## 2023-03-27 NOTE — Telephone Encounter (Signed)
Pt doing fine after today's surgery./sh 

## 2023-03-27 NOTE — Progress Notes (Unsigned)
Follow-Up Visit   Subjective  Lindsey Knight is a 62 y.o. female who presents for the following: Lipoma vs other (Mid back, pt presents for excision, has been there 2 yrs with hx of ultrasound that said likely Lipoma).  The following portions of the chart were reviewed this encounter and updated as appropriate:   Tobacco  Allergies  Meds  Problems  Med Hx  Surg Hx  Fam Hx     Review of Systems:  No other skin or systemic complaints except as noted in HPI or Assessment and Plan.  Objective  Well appearing patient in no apparent distress; mood and affect are within normal limits.  A focused examination was performed including back. Relevant physical exam findings are noted in the Assessment and Plan.  Mid Back Rubbery nodule 5.0 x 3.5cm   Assessment & Plan  Neoplasm of skin Mid Back  Skin excision  Lesion length (cm):  5 Lesion width (cm):  3.5 Margin per side (cm):  0 Total excision diameter (cm):  5 Informed consent: discussed and consent obtained   Timeout: patient name, date of birth, surgical site, and procedure verified   Procedure prep:  Patient was prepped and draped in usual sterile fashion Prep type:  Isopropyl alcohol and povidone-iodine Anesthesia: the lesion was anesthetized in a standard fashion   Anesthetic:  1% lidocaine w/ epinephrine 1-100,000 buffered w/ 8.4% NaHCO3 (12cc lido w/ epi) Instrument used: #15 blade   Hemostasis achieved with: pressure   Hemostasis achieved with comment:  Electrocautery Outcome: patient tolerated procedure well with no complications   Post-procedure details: sterile dressing applied and wound care instructions given   Dressing type: bandage, pressure dressing and bacitracin (Mupirocin)    Skin repair Complexity:  Complex Final length (cm):  3 Reason for type of repair: reduce tension to allow closure, reduce the risk of dehiscence, infection, and necrosis, reduce subcutaneous dead space and avoid a hematoma, allow  closure of the large defect, preserve normal anatomy, preserve normal anatomical and functional relationships and enhance both functionality and cosmetic results   Undermining: area extensively undermined   Undermining comment:  Undermining Defect 3.5cm Subcutaneous layers (deep stitches):  Suture size:  3-0 Suture type: Vicryl (polyglactin 910)   Subcutaneous suture technique: Inverted Dermal. Fine/surface layer approximation (top stitches):  Suture size:  3-0 Suture type: nylon   Stitches: horizontal mattress and simple interrupted   Stitches comment:  2 horizontal mattress and 1 simple interrupted Suture removal (days):  7 Hemostasis achieved with: pressure Outcome: patient tolerated procedure well with no complications   Post-procedure details: sterile dressing applied and wound care instructions given   Dressing type: bandage, pressure dressing and bacitracin (Mupirocin)    mupirocin ointment (BACTROBAN) 2 % Apply 1 Application topically daily. Qd to excision site  Specimen 1 - Surgical pathology Differential Diagnosis: D48.5 Lipoma vs other  Check Margins: No Rubbery nodule 5.0 x 3.5cm  Lipoma vs other excised today Start Mupirocin oint qd to excision site Start Doxycycline 100mg  1 po bid with food and drink  Doxycycline should be taken with food to prevent nausea. Do not lay down for 30 minutes after taking. Be cautious with sun exposure and use good sun protection while on this medication. Pregnant women should not take this medication.     Return in about 1 week (around 04/03/2023) for suture removal.  I, Ardis Rowan, RMA, am acting as scribe for Armida Sans, MD . Documentation: I have reviewed the above documentation for accuracy and  completeness, and I agree with the above.  Sarina Ser, MD

## 2023-03-28 ENCOUNTER — Ambulatory Visit: Payer: BC Managed Care – PPO | Admitting: Dermatology

## 2023-03-29 ENCOUNTER — Encounter: Payer: Self-pay | Admitting: Dermatology

## 2023-04-03 ENCOUNTER — Ambulatory Visit (INDEPENDENT_AMBULATORY_CARE_PROVIDER_SITE_OTHER): Payer: BC Managed Care – PPO | Admitting: Dermatology

## 2023-04-03 VITALS — BP 127/70 | HR 84

## 2023-04-03 DIAGNOSIS — D171 Benign lipomatous neoplasm of skin and subcutaneous tissue of trunk: Secondary | ICD-10-CM

## 2023-04-03 NOTE — Progress Notes (Signed)
   Follow-Up Visit   Subjective  Khali Starkovich is a 62 y.o. female who presents for the following: Suture removal of the mid back.   Pathology showed Lipoma  The following portions of the chart were reviewed this encounter and updated as appropriate: medications, allergies, medical history  Review of Systems:  No other skin or systemic complaints except as noted in HPI or Assessment and Plan.  Objective  Well appearing patient in no apparent distress; mood and affect are within normal limits.  Areas Examined:mid back   Relevant physical exam findings are noted in the Assessment and Plan.   Assessment & Plan   Encounter for Removal of Sutures - Incision site is clean, dry and intact. - Wound cleansed, sutures removed, wound cleansed and steri strips applied.  - Discussed pathology results showing Lipoma  - Patient advised to keep steri-strips dry until they fall off. - Scars remodel for a full year. - Once steri-strips fall off, patient can apply over-the-counter silicone scar cream once to twice a day to help with scar remodeling if desired. - Patient advised to call with any concerns or if they notice any new or changing lesions.  Return for scheduled appt with Dr Onalee Hua .  IAngelique Holm, CMA, am acting as scribe for Armida Sans, MD .   Documentation: I have reviewed the above documentation for accuracy and completeness, and I agree with the above.  Armida Sans, MD

## 2023-04-03 NOTE — Patient Instructions (Signed)
Due to recent changes in healthcare laws, you may see results of your pathology and/or laboratory studies on MyChart before the doctors have had a chance to review them. We understand that in some cases there may be results that are confusing or concerning to you. Please understand that not all results are received at the same time and often the doctors may need to interpret multiple results in order to provide you with the best plan of care or course of treatment. Therefore, we ask that you please give us 2 business days to thoroughly review all your results before contacting the office for clarification. Should we see a critical lab result, you will be contacted sooner.   If You Need Anything After Your Visit  If you have any questions or concerns for your doctor, please call our main line at [phone] and press option 4 to reach your doctor's medical assistant. If no one answers, please leave a voicemail as directed and we will return your call as soon as possible. Messages left after 4 pm will be answered the following business day.   You may also send us a message via MyChart. We typically respond to MyChart messages within 1-2 business days.  For prescription refills, please ask your pharmacy to contact our office. Our fax number is [phone].  If you have an urgent issue when the clinic is closed that cannot wait until the next business day, you can page your doctor at the number below.    Please note that while we do our best to be available for urgent issues outside of office hours, we are not available 24/7.   If you have an urgent issue and are unable to reach us, you may choose to seek medical care at your doctor's office, retail clinic, urgent care center, or emergency room.  If you have a medical emergency, please immediately call 911 or go to the emergency department.  Pager Numbers  - Dr. Kowalski: [phone]  - Dr. Moye: [phone]  - Dr. Stewart:  [phone]  In the event of inclement weather, please call our main line at [phone] for an update on the status of any delays or closures.  Dermatology Medication Tips: Please keep the boxes that topical medications come in in order to help keep track of the instructions about where and how to use these. Pharmacies typically print the medication instructions only on the boxes and not directly on the medication tubes.   If your medication is too expensive, please contact our office at [phone] option 4 or send us a message through MyChart.   We are unable to tell what your co-pay for medications will be in advance as this is different depending on your insurance coverage. However, we may be able to find a substitute medication at lower cost or fill out paperwork to get insurance to cover a needed medication.   If a prior authorization is required to get your medication covered by your insurance company, please allow us 1-2 business days to complete this process.  Drug prices often vary depending on where the prescription is filled and some pharmacies may offer cheaper prices.  The website www.goodrx.com contains coupons for medications through different pharmacies. The prices here do not account for what the cost may be with help from insurance (it may be cheaper with your insurance), but the website can give you the price if you did not use any insurance.  - You can print the associated coupon and take it with   your prescription to the pharmacy.  - You may also stop by our office during regular business hours and pick up a GoodRx coupon card.  - If you need your prescription sent electronically to a different pharmacy, notify our office through [place] [facility] MyChart or by phone at [phone] option 4.     Si Usted Necesita Algo Despus de Su Visita  Tambin puede enviarnos un mensaje a travs de MyChart. Por lo general respondemos a los mensajes de MyChart en el transcurso de 1 a 2  das hbiles.  Para renovar recetas, por favor pida a su farmacia que se ponga en contacto con nuestra oficina. Nuestro nmero de fax es el [phone].  Si tiene un asunto urgente cuando la clnica est cerrada y que no puede esperar hasta el siguiente da hbil, puede llamar/localizar a su doctor(a) al nmero que aparece a continuacin.   Por favor, tenga en cuenta que aunque hacemos todo lo posible para estar disponibles para asuntos urgentes fuera del horario de oficina, no estamos disponibles las 24 horas del da, los 7 das de la semana.   Si tiene un problema urgente y no puede comunicarse con nosotros, puede optar por buscar atencin mdica  en el consultorio de su doctor(a), en una clnica privada, en un centro de atencin urgente o en una sala de emergencias.  Si tiene una emergencia mdica, por favor llame inmediatamente al 911 o vaya a la sala de emergencias.  Nmeros de bper  - Dr. Kowalski: [phone]  - Dra. Moye: [phone]  - Dra. Stewart: [phone]  En caso de inclemencias del tiempo, por favor llame a nuestra lnea principal al [phone] para una actualizacin sobre el estado de cualquier retraso o cierre.  Consejos para la medicacin en dermatologa: Por favor, guarde las cajas en las que vienen los medicamentos de uso tpico para ayudarle a seguir las instrucciones sobre dnde y cmo usarlos. Las farmacias generalmente imprimen las instrucciones del medicamento slo en las cajas y no directamente en los tubos del medicamento.   Si su medicamento es muy caro, por favor, pngase en contacto con nuestra oficina llamando al [phone] y presione la opcin 4 o envenos un mensaje a travs de MyChart.   No podemos decirle cul ser su copago por los medicamentos por adelantado ya que esto es diferente dependiendo de la cobertura de su seguro. Sin embargo, es posible que podamos encontrar un medicamento sustituto a menor costo o llenar un formulario para que el  seguro cubra el medicamento que se considera necesario.   Si se requiere una autorizacin previa para que su compaa de seguros cubra su medicamento, por favor permtanos de 1 a 2 das hbiles para completar este proceso.  Los precios de los medicamentos varan con frecuencia dependiendo del lugar de dnde se surte la receta y alguna farmacias pueden ofrecer precios ms baratos.  El sitio web www.goodrx.com tiene cupones para medicamentos de diferentes farmacias. Los precios aqu no tienen en cuenta lo que podra costar con la ayuda del seguro (puede ser ms barato con su seguro), pero el sitio web puede darle el precio si no utiliz ningn seguro.  - Puede imprimir el cupn correspondiente y llevarlo con su receta a la farmacia.  - Tambin puede pasar por nuestra oficina durante el horario de atencin regular y recoger una tarjeta de cupones de GoodRx.  - Si necesita que su receta se enve electrnicamente a una farmacia diferente, informe a nuestra oficina a travs de MyChart de [place]   o por telfono llamando al [phone] y presione la opcin 4.  

## 2023-04-09 ENCOUNTER — Ambulatory Visit: Payer: BC Managed Care – PPO | Admitting: Family Medicine

## 2023-04-09 VITALS — BP 128/78 | HR 87 | Ht 63.0 in | Wt 163.0 lb

## 2023-04-09 DIAGNOSIS — G8929 Other chronic pain: Secondary | ICD-10-CM | POA: Diagnosis not present

## 2023-04-09 DIAGNOSIS — M79644 Pain in right finger(s): Secondary | ICD-10-CM

## 2023-04-09 DIAGNOSIS — M65311 Trigger thumb, right thumb: Secondary | ICD-10-CM

## 2023-04-09 NOTE — Progress Notes (Signed)
   Rubin Payor, PhD, LAT, ATC acting as a scribe for Clementeen Graham, MD.  Lindsey Knight is a 62 y.o. female who presents to [facility] today for 6-wk R hand pain w/ thumb trigger. She is R-hand dominate. Pt was last seen by Dr. [name] on 02/26/23 and was given a R 1st trigger thumb steroid injection and was referred to hand therapy, completing 1 visit (and canceling subsequent visit.) Today, pt reports R hand is feeling OK. She notes that she hasn't been sewing lately, quilting, which is a typical exacerbating factor. She did not care for OT.  Dx imaging: 02/26/23 R hand XR  Pertinent review of systems: No fevers or chills  Relevant historical information: History of colon cancer and Crohn's disease.   Exam:  BP 128/78   Pulse 87   Ht 5\' 3"  (1.6 m)   Wt 163 lb (73.9 kg)   SpO2 97%   BMI 28.87 kg/m  General: Well Developed, well nourished, and in no acute distress.   MSK: Right hand normal appearing Nontender. Normal motion. Intact strength.    Lab and Radiology Results  EXAM: RIGHT HAND - COMPLETE 3+ VIEW   COMPARISON:  None Available.   FINDINGS: Mild thumb interphalangeal joint space narrowing and peripheral spurring. No acute fracture or dislocation. The cortices are intact.   IMPRESSION: Mild thumb interphalangeal osteoarthritis.     Electronically Signed   By: Neita Garnet M.D.   On: 02/26/2023 19:00   I, Clementeen Graham, personally (independently) visualized and performed the interpretation of the images attached in this note.      Assessment and Plan: 62 y.o. female with right hand pain thought to be partially due to trigger finger now improved with injection and partially due to DJD especially the IP joint.  She is learned some exercises with OT which she will continue.  Recommend Tylenol and heat and Voltaren gel.  If needed more to do including trial of injection of the IP joint itself.  We could try OT again using a different  therapist.    Discussed warning signs or symptoms. Please see discharge instructions. Patient expresses understanding.   The above documentation has been reviewed and is accurate and complete Clementeen Graham, M.D.

## 2023-04-09 NOTE — Patient Instructions (Signed)
Thank you for coming in today.   Ok to do some watchful waiting.   Ok to use voltaren gel and heat and home exercises.

## 2023-04-10 ENCOUNTER — Encounter: Payer: Self-pay | Admitting: Dermatology

## 2023-04-27 ENCOUNTER — Telehealth: Payer: Self-pay | Admitting: *Deleted

## 2023-04-27 ENCOUNTER — Other Ambulatory Visit: Payer: Self-pay | Admitting: *Deleted

## 2023-04-27 DIAGNOSIS — D509 Iron deficiency anemia, unspecified: Secondary | ICD-10-CM

## 2023-04-27 NOTE — Telephone Encounter (Signed)
-----   Message from Chrystie Nose, RN sent at 04/27/2023  7:43 AM EDT ----- Regarding: FW: Labs  ----- Message ----- From: Chrystie Nose, RN Sent: 04/27/2023  12:00 AM EDT To: Chrystie Nose, RN Subject: Labs                                           Pt needs labs, orders in epic.

## 2023-04-27 NOTE — Telephone Encounter (Signed)
Patient called to remind of the 6 month blood draw due to possible iron deficiency. Hours of operation for lab and address also left on vm.

## 2023-04-30 ENCOUNTER — Other Ambulatory Visit (INDEPENDENT_AMBULATORY_CARE_PROVIDER_SITE_OTHER): Payer: BC Managed Care – PPO

## 2023-04-30 DIAGNOSIS — D509 Iron deficiency anemia, unspecified: Secondary | ICD-10-CM

## 2023-04-30 LAB — CBC WITH DIFFERENTIAL/PLATELET
Basophils Absolute: 0 10*3/uL (ref 0.0–0.1)
Basophils Relative: 0.5 % (ref 0.0–3.0)
Eosinophils Absolute: 0.3 10*3/uL (ref 0.0–0.7)
Eosinophils Relative: 3.1 % (ref 0.0–5.0)
HCT: 41.8 % (ref 36.0–46.0)
Hemoglobin: 14.3 g/dL (ref 12.0–15.0)
Lymphocytes Relative: 22.7 % (ref 12.0–46.0)
Lymphs Abs: 2.3 10*3/uL (ref 0.7–4.0)
MCHC: 34.1 g/dL (ref 30.0–36.0)
MCV: 88.4 fl (ref 78.0–100.0)
Monocytes Absolute: 0.7 10*3/uL (ref 0.1–1.0)
Monocytes Relative: 6.8 % (ref 3.0–12.0)
Neutro Abs: 6.9 10*3/uL (ref 1.4–7.7)
Neutrophils Relative %: 66.9 % (ref 43.0–77.0)
Platelets: 376 10*3/uL (ref 150.0–400.0)
RBC: 4.73 Mil/uL (ref 3.87–5.11)
RDW: 12.8 % (ref 11.5–15.5)
WBC: 10.3 10*3/uL (ref 4.0–10.5)

## 2023-04-30 LAB — IBC + FERRITIN
Ferritin: 114 ng/mL (ref 10.0–291.0)
Iron: 65 ug/dL (ref 42–145)
Saturation Ratios: 25.1 % (ref 20.0–50.0)
TIBC: 259 ug/dL (ref 250.0–450.0)
Transferrin: 185 mg/dL — ABNORMAL LOW (ref 212.0–360.0)

## 2023-05-03 ENCOUNTER — Encounter: Payer: Self-pay | Admitting: Radiology

## 2023-05-03 ENCOUNTER — Ambulatory Visit (INDEPENDENT_AMBULATORY_CARE_PROVIDER_SITE_OTHER): Payer: BC Managed Care – PPO | Admitting: Radiology

## 2023-05-03 VITALS — BP 126/74 | Ht 62.5 in | Wt 162.0 lb

## 2023-05-03 DIAGNOSIS — Z01419 Encounter for gynecological examination (general) (routine) without abnormal findings: Secondary | ICD-10-CM | POA: Diagnosis not present

## 2023-05-03 DIAGNOSIS — M810 Age-related osteoporosis without current pathological fracture: Secondary | ICD-10-CM

## 2023-05-03 DIAGNOSIS — N958 Other specified menopausal and perimenopausal disorders: Secondary | ICD-10-CM | POA: Diagnosis not present

## 2023-05-03 MED ORDER — ALENDRONATE SODIUM 70 MG PO TABS: 70.0000 mg | ORAL_TABLET | ORAL | 4 refills | Status: DC

## 2023-05-03 MED ORDER — ESTRADIOL 0.1 MG/GM VA CREA: 1.0000 g | TOPICAL_CREAM | VAGINAL | 12 refills | Status: DC

## 2023-05-03 NOTE — Progress Notes (Signed)
   Myeisha Kruser May 27, 1961 [id number]   History: Postmenopausal 62 y.o. presents for annual exam. Doing well on fosamax for osteoporosis. Using estrace only one a week. Still having trouble sleeping even on Trazodone, CBD and magnesium. No other gyn concerns.   Gynecologic History Postmenopausal Last Pap: 2023. Results were: normal Last mammogram: 08/03/22. Results were: normal Last colonoscopy: 3/23 DEXA: 5/23 osteoporosis of spine  Obstetric History OB History  Gravida Para Term Preterm AB Living  3 2     1 2   SAB IAB Ectopic Multiple Live Births    1     2    # Outcome Date GA Lbr Len/2nd Weight Sex Delivery Anes PTL Lv  3 IAB           2 Para           1 Para              The following portions of the patient's history were reviewed and updated as appropriate: allergies, current medications, past family history, past medical history, past social history, past surgical history, and problem list.  Review of Systems Pertinent items noted in HPI and remainder of comprehensive ROS otherwise negative.  Past medical history, past surgical history, family history and social history were all reviewed and documented in the EPIC chart.  Exam:  Vitals:   05/03/23 1155  BP: 126/74  Weight: 162 lb (73.5 kg)  Height: 5' 2.5" (1.588 m)   Body mass index is 29.16 kg/m.  General appearance:  Normal Thyroid:  Symmetrical, normal in size, without palpable masses or nodularity. Respiratory  Auscultation:  Clear without wheezing or rhonchi Cardiovascular  Auscultation:  Regular rate, without rubs, murmurs or gallops  Edema/varicosities:  Not grossly evident Abdominal  Soft,nontender, without masses, guarding or rebound.  Liver/spleen:  No organomegaly noted  Hernia:  None appreciated  Skin  Inspection:  Grossly normal Breasts: Examined lying and sitting.   Right: Without masses, retractions, nipple discharge or axillary adenopathy.   Left: Without masses, retractions, nipple  discharge or axillary adenopathy. Genitourinary   Inguinal/mons:  Normal without inguinal adenopathy  External genitalia:  Normal appearing vulva with no masses, tenderness, or lesions  BUS/Urethra/Skene's glands:  Normal  Vagina:  Normal appearing with normal color and discharge, no lesions. Atrophy: improved   Cervix:  Normal appearing without discharge or lesions  Uterus:  Normal in size, shape and contour.  Midline and mobile, nontender  Adnexa/parametria:     Rt: Normal in size, without masses or tenderness.   Lt: Normal in size, without masses or tenderness.  Anus and perineum: Normal    Raynelle Fanning, CMA present for exam  Assessment/Plan:   1. Well woman exam with routine gynecological exam Pap due 2028 Follow up with PCP re: sleep  2. Genitourinary syndrome of menopause - estradiol (ESTRACE VAGINAL) 0.1 MG/GM vaginal cream; Place 1 g vaginally 3 (three) times a week.  Dispense: 42.5 g; Refill: 12  3. Osteoporosis of lumbar spine DEXA due 03/2024 - alendronate (FOSAMAX) 70 MG tablet; Take 1 tablet (70 mg total) by mouth every 7 (seven) days. Take with a full glass of water on an empty stomach.  Dispense: 12 tablet; Refill: 4    Discussed SBE, colonoscopy and DEXA screening as directed. Recommend of exercise weekly, including weight bearing exercise. Encouraged the use of seatbelts and sunscreen.  Return in 1 year for annual or sooner prn.  Tanda Rockers WHNP-BC, 12:24 PM 05/03/2023

## 2023-05-15 ENCOUNTER — Ambulatory Visit (INDEPENDENT_AMBULATORY_CARE_PROVIDER_SITE_OTHER): Payer: BC Managed Care – PPO | Admitting: Dermatology

## 2023-05-15 ENCOUNTER — Encounter: Payer: Self-pay | Admitting: Dermatology

## 2023-05-15 VITALS — BP 110/71

## 2023-05-15 DIAGNOSIS — W908XXA Exposure to other nonionizing radiation, initial encounter: Secondary | ICD-10-CM

## 2023-05-15 DIAGNOSIS — Z1283 Encounter for screening for malignant neoplasm of skin: Secondary | ICD-10-CM | POA: Diagnosis not present

## 2023-05-15 DIAGNOSIS — D1801 Hemangioma of skin and subcutaneous tissue: Secondary | ICD-10-CM | POA: Diagnosis not present

## 2023-05-15 DIAGNOSIS — L814 Other melanin hyperpigmentation: Secondary | ICD-10-CM | POA: Diagnosis not present

## 2023-05-15 DIAGNOSIS — L821 Other seborrheic keratosis: Secondary | ICD-10-CM | POA: Diagnosis not present

## 2023-05-15 DIAGNOSIS — L578 Other skin changes due to chronic exposure to nonionizing radiation: Secondary | ICD-10-CM

## 2023-05-15 DIAGNOSIS — D229 Melanocytic nevi, unspecified: Secondary | ICD-10-CM

## 2023-05-15 NOTE — Progress Notes (Signed)
   Follow-Up Visit   Subjective  Lindsey Knight is a 62 y.o. female who presents for the following: Skin Cancer Screening and Full Body Skin Exam. No personal or family history of skin cancer. This is her first skin exam.   The patient presents for Total-Body Skin Exam (TBSE) for skin cancer screening and mole check. The patient has spots, moles and lesions to be evaluated, some may be new or changing and the patient has concerns that these could be cancer.    The following portions of the chart were reviewed this encounter and updated as appropriate: medications, allergies, medical history  Review of Systems:  No other skin or systemic complaints except as noted in HPI or Assessment and Plan.  Objective  Well appearing patient in no apparent distress; mood and affect are within normal limits.  A full examination was performed including scalp, head, eyes, ears, nose, lips, neck, chest, axillae, abdomen, back, buttocks, bilateral upper extremities, bilateral lower extremities, hands, feet, fingers, toes, fingernails, and toenails. All findings within normal limits unless otherwise noted below.   Relevant physical exam findings are noted in the Assessment and Plan.    Assessment & Plan   LENTIGINES, SEBORRHEIC KERATOSES, HEMANGIOMAS - Benign normal skin lesions - Benign-appearing - Call for any changes  MELANOCYTIC NEVI - Tan-brown and/or pink-flesh-colored symmetric macules and papules - Benign appearing on exam today - Observation - Call clinic for new or changing moles - Recommend daily use of broad spectrum spf 30+ sunscreen to sun-exposed areas.   ACTINIC DAMAGE - Chronic condition, secondary to cumulative UV/sun exposure - diffuse scaly erythematous macules with underlying dyspigmentation - Recommend daily broad spectrum sunscreen SPF 30+ to sun-exposed areas, reapply every 2 hours as needed.  - Staying in the shade or wearing long sleeves, sun glasses (UVA+UVB protection)  and wide brim hats (4-inch brim around the entire circumference of the hat) are also recommended for sun protection.  - Call for new or changing lesions.    SKIN CANCER SCREENING PERFORMED TODAY.     Return in about 1 year (around 05/14/2024) for TBSE.  Jaclynn Guarneri, CMA, am acting as scribe for [employer], DO.   Documentation: I have reviewed the above documentation for accuracy and completeness, and I agree with the above.  Langston Reusing, DO

## 2023-05-15 NOTE — Patient Instructions (Addendum)
Here are the key points from our discussion today:  - Healing of Lipoma Excision: The site of your recent lipoma excision is healing well, and the scar appears to be in excellent condition.   - Skin Examination: I examined your skin from your scalp downwards and found everything to be normal, including the benign melanocytic nevi and cherry angiomas. Your breasts are clear of any abnormalities.  - Skin Care Recommendations:   - Continue using sunscreen diligently. I recommend mineral sunscreens for better protection.   - For your face, consider using Neutrogena UV Daily Mineral or La Roche-Posay Ultralight. We provided samples for you to try.   - For additional protection, especially at the beach, use the [employer].   - The Neutrogena Ultra-Sheer Stick is also a good option for easy application on the face.  - General Advice:   - If you experience bruising, consuming pineapple or bromelain supplements might help in faster recovery due to their properties.  Please continue to monitor your skin and do not hesitate to contact us if you notice any new or changing lesions. Your next routine check-up is due as per our usual schedule, or earlier if you have any concerns.  Thank you once again for your proactive approach to your skin health.        Due to recent changes in healthcare laws, you may see results of your pathology and/or laboratory studies on MyChart before the doctors have had a chance to review them. We understand that in some cases there may be results that are confusing or concerning to you. Please understand that not all results are received at the same time and often the doctors may need to interpret multiple results in order to provide you with the best plan of care or course of treatment. Therefore, we ask that you please give [place] 2 business days to thoroughly review all your results before contacting the office for clarification. Should we see a critical lab  result, you will be contacted sooner.   If You Need Anything After Your Visit  If you have any questions or concerns for your doctor, please call our main line at [phone] If no one answers, please leave a voicemail as directed and we will return your call as soon as possible. Messages left after 4 pm will be answered the following business day.   You may also send [place] a message via MyChart. We typically respond to MyChart messages within 1-2 business days.  For prescription refills, please ask your pharmacy to contact our office. Our fax number is [phone].  If you have an urgent issue when the clinic is closed that cannot wait until the next business day, you can page your doctor at the number below.    Please note that while we do our best to be available for urgent issues outside of office hours, we are not available 24/7.   If you have an urgent issue and are unable to reach [place], you may choose to seek medical care at your doctor's office, retail clinic, urgent care center, or emergency room.  If you have a medical emergency, please immediately call 911 or go to the emergency department. In the event of inclement weather, please call our main line at [phone] for an update on the status of any delays or closures.  Dermatology Medication Tips: Please keep the boxes that topical medications come in in order to help keep track of the instructions about where and how  to use these. Pharmacies typically print the medication instructions only on the boxes and not directly on the medication tubes.   If your medication is too expensive, please contact our office at [phone] or send [place] a message through MyChart.   We are unable to tell what your co-pay for medications will be in advance as this is different depending on your insurance coverage. However, we may be able to find a substitute medication at lower cost or fill out paperwork to get insurance to cover a needed medication.    If a prior authorization is required to get your medication covered by your insurance company, please allow [place] 1-2 business days to complete this process.  Drug prices often vary depending on where the prescription is filled and some pharmacies may offer cheaper prices.  The website www.goodrx.com contains coupons for medications through different pharmacies. The prices here do not account for what the cost may be with help from insurance (it may be cheaper with your insurance), but the website can give you the price if you did not use any insurance.  - You can print the associated coupon and take it with your prescription to the pharmacy.  - You may also stop by our office during regular business hours and pick up a GoodRx coupon card.  - If you need your prescription sent electronically to a different pharmacy, notify our office through [facility] or by phone at [phone]    Skin Education :   I counseled the patient regarding the following: Sun screen (SPF 30 or greater) should be applied during peak UV exposure (between 10am and 2pm) and reapplied after exercise or swimming.  The ABCDEs of melanoma were reviewed with the patient, and the importance of monthly self-examination of moles was emphasized. Should any moles change in shape or color, or itch, bleed or burn, pt will contact our office for evaluation sooner then their interval appointment.  Plan: Sunscreen Recommendations I recommended a broad spectrum sunscreen with a SPF of 30 or higher. I explained that SPF 30 sunscreens block approximately 97 percent of the sun's harmful rays. Sunscreens should be applied at least 15 minutes prior to expected sun exposure and then every 2 hours after that as long as sun exposure continues. If swimming or exercising sunscreen should be reapplied every 45 minutes to an hour after getting wet or sweating. One ounce, or the equivalent of a shot glass full of sunscreen, is adequate to  protect the skin not covered by a bathing suit. I also recommended a lip balm with a sunscreen as well. Sun protective clothing can be used in lieu of sunscreen but must be worn the entire time you are exposed to the sun's rays.

## 2023-05-18 ENCOUNTER — Encounter: Payer: Self-pay | Admitting: Pulmonary Disease

## 2023-05-18 ENCOUNTER — Ambulatory Visit: Payer: BC Managed Care – PPO | Admitting: Pulmonary Disease

## 2023-05-18 VITALS — BP 110/70 | HR 79 | Ht 62.5 in | Wt 161.4 lb

## 2023-05-18 DIAGNOSIS — J4520 Mild intermittent asthma, uncomplicated: Secondary | ICD-10-CM | POA: Diagnosis not present

## 2023-05-18 DIAGNOSIS — J209 Acute bronchitis, unspecified: Secondary | ICD-10-CM

## 2023-05-18 DIAGNOSIS — J0100 Acute maxillary sinusitis, unspecified: Secondary | ICD-10-CM

## 2023-05-18 DIAGNOSIS — R0982 Postnasal drip: Secondary | ICD-10-CM

## 2023-05-18 MED ORDER — PREDNISONE 10 MG PO TABS: ORAL_TABLET | ORAL | 0 refills | Status: AC

## 2023-05-18 MED ORDER — IPRATROPIUM BROMIDE 0.03 % NA SOLN: 2.0000 | Freq: Two times a day (BID) | NASAL | 12 refills | Status: DC

## 2023-05-18 MED ORDER — FLUTICASONE-SALMETEROL 250-50 MCG/ACT IN AEPB: 1.0000 | INHALATION_SPRAY | Freq: Two times a day (BID) | RESPIRATORY_TRACT | 6 refills | Status: DC

## 2023-05-18 MED ORDER — AZITHROMYCIN 250 MG PO TABS: ORAL_TABLET | ORAL | 0 refills | Status: DC

## 2023-05-18 NOTE — Patient Instructions (Addendum)
Continue advair 1 puff twice daily - rinse mouth out after each use  Continue montelukast 10mg  daily at bedtime  Continue zyrtec daily  Continue famotidine twice daily, consider returning to protonix - Discuss with Dr. Rhea Belton  Start prednisone taper: 40mg  daily x 3 days 30mg  daily x 3 days 20mg  daily x 3 days 10mg  daily x 3 days  Start Zpak antibiotic  Start ipratropium nasal spray, 2 sprays per nostril twice daily  Continue flonase nasal spray daily  Follow up in 6 months, call sooner if needed

## 2023-05-18 NOTE — Progress Notes (Signed)
Synopsis: Referred in February 2023 for abnormal chest imaging by Jarold Motto, PA  Subjective:   PATIENT ID: Lindsey Knight GENDER: female DOB: [birth year], MRN: [id number]  HPI  Chief Complaint  Patient presents with   Follow-up    Annual f/up, hard coughing, cough keeps pt awake.   Lindsey Knight is a 62 year old woman, never smoker with history of asthma who returns to pulmonary clinic for cough.   She reports sinus drainage with post nasal drip and some sinus pressure over recent weeks. She has significant cough that can keep her up at night. She is using advair 250-81mcg 1 puff twice daily and as needed albuterol. She is using flonase 1 spray per nostril daily. She continues to have reflux symptoms despite famotidine 20mg  twice daily.   OV 10/09/22 She reports increased cough over the past 2 months. She describes an event where she was bending over in the community garden and possibly had a significant reflux episode. She denies any viral illnesses recently.   She saw GI 08/01/22 with plan to reduce PPI dosing then transition to famotidine for 2 weeks then to use as needed. She reports the famotidine did not help, she reports burning sensation in her chest and throat. She then started omeprazole OTC which seems to have reduced her reflux symptoms.   She has advair 250-14mcg 1 puff twice daily that she has been using as needed. She has albuterol as needed as well. She has not felt much relief.   She has not been on steroids or antibiotics.   OV 03/06/22 Chest x-ray today shows clearing of the right upper lobe infiltrates. Will await final radiology read.   She is using advair 100-22mcg 1 puff twice daily as needed along with daily allegra and montelukast for her asthma. No night time awakenings. No issues with spring allergies.  She has been feeling well since last visit. He cough has significantly decreased. She recently had EGD and C-scope and started on PPI therapy for GERD.   OV  01/03/22 Patient reports having respiratory illness in mid-December 2022 and was treated with Zpak on 12/13. PCP note from 11/08/2021 reviewed. Chest x-ray on 12/30 showed increased lung markings with mild right mid lung and left basilar linear scaring or atelectasis. She was also noted to have increased platelet count. She had CT Chest on 11/29/21 that showed mediastinal, axillary and hilar lymphadenopathy along with patchy ground-glass and adjacent focal clustered nodularity in the right upper lobe and adjacent band like atelectasis/scarring at the junction of the right upper lobe.   Patient reports resolution of her infectious symptoms from December and is feeling at her baseline. She will occasionally have some right sided discomfort but it is overall improved from December. She notices the pain when laying down or moving when she sleeps. She denies any shortness of breath, fevers, chills or night sweats. She denies hemoptysis. She does have joint aches in general which include her knees, hips and occasional hand stiffness in the mornings.  She has history of asthma and mainly has issues with reactive airways disease when triggered by viral infections and uses inhalers as needed. She denies issues with seasonal allergies. She had sinus surgery in the past with significant reduction in frequency of sinus infections.   She is a never smoker. She is a retired Engineer, civil (consulting). She has a sister with crohn's disease and her mother had colon cancer.   Past Medical History:  Diagnosis Date   Allergy Child  Anemia    Arthritis    Asthma    GERD (gastroesophageal reflux disease) 10/22   Hiatal hernia    Osteoporosis    Ulcer 6/23   Vaginal delivery    x 2, 1985, 1990     Family History  Problem Relation Age of Onset   Colon cancer Mother    Cancer Mother    Hearing loss Mother    Heart failure Father    Asthma Father    COPD Father    Hearing loss Father    Crohn's disease Sister    Pancreatic cancer  Other    Ovarian cancer Other    Uterine cancer Other      Social History   Socioeconomic History   Marital status: Married    Spouse name: Not on file   Number of children: Not on file   Years of education: Not on file   Highest education level: Not on file  Occupational History   Not on file  Tobacco Use   Smoking status: Never    Passive exposure: Never   Smokeless tobacco: Never  Vaping Use   Vaping Use: Never used  Substance and Sexual Activity   Alcohol use: Yes    Alcohol/week: 2.0 standard drinks of alcohol    Types: 2 Standard drinks or equivalent per week   Drug use: Not Currently    Types: Marijuana    Comment: THC for sleep   Sexual activity: Not Currently    Partners: Male    Birth control/protection: Post-menopausal    Comment: menarche 62yo, sexual debut 62yo  Other Topics Concern   Not on file  Social History Narrative   Retired Engineer, civil (consulting)   Married   Has two local daughters and grandchildren   Social Determinants of Corporate investment banker Strain: Not on file  Food Insecurity: Not on file  Transportation Needs: Not on file  Physical Activity: Not on file  Stress: Not on file  Social Connections: Not on file  Intimate Partner Violence: Not on file     Allergies  Allergen Reactions   Tape Rash     Outpatient Medications Prior to Visit  Medication Sig Dispense Refill   albuterol (VENTOLIN HFA) 108 (90 Base) MCG/ACT inhaler Inhale 1 puff into the lungs every 6 (six) hours as needed.     alendronate (FOSAMAX) 70 MG tablet Take 1 tablet (70 mg total) by mouth every 7 (seven) days. Take with a full glass of water on an empty stomach. 12 tablet 4   Ascorbic Acid (VITAMIN C CR) 500 MG TBCR Take 1 tablet by mouth every other day.     benzonatate (TESSALON) 100 MG capsule Take 1 capsule (100 mg total) by mouth 3 (three) times daily as needed. 30 capsule 1   Cannabinoids (THC FREE PO) Take 2.5 mg by mouth daily in the afternoon.     Cetirizine HCl  (ZYRTEC PO) Take by mouth.     Cholecalciferol (VITAMIN D3 ADULT GUMMIES PO)      Cyanocobalamin (VITAMIN B-12 PO) Take 3,000 mcg by mouth 2 (two) times a week.     estradiol (ESTRACE VAGINAL) 0.1 MG/GM vaginal cream Place 1 g vaginally 3 (three) times a week. 42.5 g 12   famotidine (PEPCID) 20 MG tablet Take 20 mg by mouth 2 (two) times daily.     ferrous sulfate 324 MG TBEC Take 324 mg by mouth every other day.     fluticasone (FLONASE) 50 MCG/ACT nasal  spray Place 1 spray into both nostrils daily.     MAGNESIUM GLYCINATE PO      Melatonin 5 MG CHEW Chew by mouth.     montelukast (SINGULAIR) 10 MG tablet TAKE 1 TABLET BY MOUTH EVERYDAY AT BEDTIME 90 tablet 3   Multiple Vitamins-Minerals (MULTIVITAMIN ADULT) CHEW      traZODone (DESYREL) 100 MG tablet Take 1 tablet (100 mg total) by mouth at bedtime. 90 tablet 3   fluticasone-salmeterol (ADVAIR DISKUS) 250-50 MCG/ACT AEPB Inhale 1 puff into the lungs in the morning and at bedtime. 60 each 6   fexofenadine (ALLEGRA) 180 MG tablet Take 180 mg by mouth daily. (Patient not taking: Reported on 05/03/2023)     No facility-administered medications prior to visit.    Review of Systems  Constitutional:  Negative for chills, fever, malaise/fatigue and weight loss.  HENT:  Negative for congestion, sinus pain and sore throat.   Eyes: Negative.   Respiratory:  Positive for cough and sputum production. Negative for hemoptysis, shortness of breath and wheezing.   Cardiovascular:  Negative for chest pain, palpitations, orthopnea, claudication and leg swelling.  Gastrointestinal:  Positive for heartburn. Negative for abdominal pain, nausea and vomiting.  Genitourinary: Negative.   Musculoskeletal:  Negative for myalgias.  Skin:  Negative for rash.  Neurological:  Negative for weakness.  Endo/Heme/Allergies: Negative.   Psychiatric/Behavioral: Negative.      Objective:   Vitals:   05/18/23 1026  BP: 110/70  Pulse: 79  SpO2: 95%  Weight: 161 lb  6.4 oz (73.2 kg)  Height: 5' 2.5" (1.588 m)   Physical Exam Constitutional:      General: She is not in acute distress.    Appearance: She is not ill-appearing.  HENT:     Head: Normocephalic and atraumatic.     Nose: Congestion present.  Eyes:     General: No scleral icterus.    Conjunctiva/sclera: Conjunctivae normal.  Cardiovascular:     Rate and Rhythm: Normal rate and regular rhythm.     Pulses: Normal pulses.     Heart sounds: Normal heart sounds. No murmur heard. Pulmonary:     Effort: Pulmonary effort is normal.     Breath sounds: Normal breath sounds. No wheezing, rhonchi or rales.  Musculoskeletal:     Right lower leg: No edema.     Left lower leg: No edema.  Skin:    General: Skin is warm and dry.  Neurological:     General: No focal deficit present.     Mental Status: She is alert.    CBC    Component Value Date/Time   WBC 10.3 04/30/2023 1447   RBC 4.73 04/30/2023 1447   HGB 14.3 04/30/2023 1447   HCT 41.8 04/30/2023 1447   PLT 376.0 04/30/2023 1447   MCV 88.4 04/30/2023 1447   MCHC 34.1 04/30/2023 1447   RDW 12.8 04/30/2023 1447   LYMPHSABS 2.3 04/30/2023 1447   MONOABS 0.7 04/30/2023 1447   EOSABS 0.3 04/30/2023 1447   BASOSABS 0.0 04/30/2023 1447      Latest Ref Rng & Units 07/12/2022   10:30 AM 11/18/2021    1:02 PM 01/21/2021    2:09 PM  BMP  Glucose 70 - 99 mg/dL 098  119  147   BUN 6 - 23 mg/dL 15  11  10    Creatinine 0.40 - 1.20 mg/dL 8.29  5.62  1.30   Sodium 135 - 145 mEq/L 135  136  134   Potassium 3.5 -  5.1 mEq/L 3.9  3.8  4.0   Chloride 96 - 112 mEq/L 101  103  101   CO2 19 - 32 mEq/L 25  26  23    Calcium 8.4 - 10.5 mg/dL 9.1  9.2  9.2    Chest imaging: CXR 03/06/22 Improved opacities of the right mid lung.  CT Chest 11/29/21 Patchy ground-glass and adjacent focal clustered nodularity in the right upper lobe and adjacent band like atelectasis/scarring at the junction of the right upper middle lobes. This is consistent with  an infectious/inflammatory process. Recommend follow-up chest CT in 3 months to ensure resolution.   Multiple prominent mediastinal and axillary lymph nodes with an enlarged right hilar lymph node and left axillary lymph node. These are favored to be reactive, attention recommended on follow-up CT.   Additional 5 mm ground-glass nodular opacity in the left upper lobe, which may or may not be related to the aforementioned infectious/inflammatory process. This can be reassessed on follow-up CT.  CXR 11/18/21 Chronic appearing increased lung markings with mild mid right lung and left basilar linear scarring and/or atelectasis.  PFT:     No data to display          Labs:  Path: Peripheral Smear 11/18/21 WBC are unremarkable. The overall findings in this smear are consistent with a reactive thrombocytosis. Clinical correlation is recommended. Anemia with RBCs which appear to be microcytic and hypochromic on smear review. Suggest evaluation for iron deficiency and/or a source of blood loss, if clinically indicated.   Echo:  Heart Catheterization:    Assessment & Plan:   Mild intermittent asthma without complication - Plan: fluticasone-salmeterol (ADVAIR DISKUS) 250-50 MCG/ACT AEPB  Subacute maxillary sinusitis - Plan: azithromycin (ZITHROMAX) 250 MG tablet, predniSONE (DELTASONE) 10 MG tablet  Post-nasal drip - Plan: ipratropium (ATROVENT) 0.03 % nasal spray  Acute bronchitis, unspecified organism - Plan: fluticasone-salmeterol (ADVAIR DISKUS) 250-50 MCG/ACT AEPB  Discussion: Lindsey Knight is a 62 year old woman, never smoker with history of asthma who returns to pulmonary clinic for cough.   She is to start prednisone taper and Zpak for sinusitis and post nasal drainage. Start ipratropium nasal spray, 2 sprays per nostril twice daily. Continue montelukast 10mg  daily at bedtimeContinue zyrtec daily  Continue advair 250-50mcg 1 puff twice daily and as needed albuterol.    Recommend she reach out to her GI team regarding going back on protonix for her reflux.   Follow up in 6 months.  Melody Comas, MD [facility] Office: [phone]    Current Outpatient Medications:    albuterol (VENTOLIN HFA) 108 (90 Base) MCG/ACT inhaler, Inhale 1 puff into the lungs every 6 (six) hours as needed., Disp: , Rfl:    alendronate (FOSAMAX) 70 MG tablet, Take 1 tablet (70 mg total) by mouth every 7 (seven) days. Take with a full glass of water on an empty stomach., Disp: 12 tablet, Rfl: 4   Ascorbic Acid (VITAMIN C CR) 500 MG TBCR, Take 1 tablet by mouth every other day., Disp: , Rfl:    azithromycin (ZITHROMAX) 250 MG tablet, Take as directed, Disp: 6 tablet, Rfl: 0   benzonatate (TESSALON) 100 MG capsule, Take 1 capsule (100 mg total) by mouth 3 (three) times daily as needed., Disp: 30 capsule, Rfl: 1   Cannabinoids (THC FREE PO), Take 2.5 mg by mouth daily in the afternoon., Disp: , Rfl:    Cetirizine HCl (ZYRTEC PO), Take by mouth., Disp: , Rfl:    Cholecalciferol (VITAMIN  D3 ADULT GUMMIES PO), , Disp: , Rfl:    Cyanocobalamin (VITAMIN B-12 PO), Take 3,000 mcg by mouth 2 (two) times a week., Disp: , Rfl:    estradiol (ESTRACE VAGINAL) 0.1 MG/GM vaginal cream, Place 1 g vaginally 3 (three) times a week., Disp: 42.5 g, Rfl: 12   famotidine (PEPCID) 20 MG tablet, Take 20 mg by mouth 2 (two) times daily., Disp: , Rfl:    ferrous sulfate 324 MG TBEC, Take 324 mg by mouth every other day., Disp: , Rfl:    fluticasone (FLONASE) 50 MCG/ACT nasal spray, Place 1 spray into both nostrils daily., Disp: , Rfl:    ipratropium (ATROVENT) 0.03 % nasal spray, Place 2 sprays into both nostrils every 12 (twelve) hours., Disp: 30 mL, Rfl: 12   MAGNESIUM GLYCINATE PO, , Disp: , Rfl:    Melatonin 5 MG CHEW, Chew by mouth., Disp: , Rfl:    montelukast (SINGULAIR) 10 MG tablet, TAKE 1 TABLET BY MOUTH EVERYDAY AT BEDTIME, Disp: 90 tablet, Rfl: 3   Multiple  Vitamins-Minerals (MULTIVITAMIN ADULT) CHEW, , Disp: , Rfl:    predniSONE (DELTASONE) 10 MG tablet, Take 4 tablets (40 mg total) by mouth daily with breakfast for 3 days, THEN 3 tablets (30 mg total) daily with breakfast for 3 days, THEN 2 tablets (20 mg total) daily with breakfast for 3 days, THEN 1 tablet (10 mg total) daily with breakfast for 3 days., Disp: 30 tablet, Rfl: 0   traZODone (DESYREL) 100 MG tablet, Take 1 tablet (100 mg total) by mouth at bedtime., Disp: 90 tablet, Rfl: 3   fluticasone-salmeterol (ADVAIR DISKUS) 250-50 MCG/ACT AEPB, Inhale 1 puff into the lungs in the morning and at bedtime., Disp: 60 each, Rfl: 6

## 2023-06-19 NOTE — Progress Notes (Signed)
Lindsey Knight is a 62 y.o. female here for a new problem.  History of Present Illness:   No chief complaint on file.   HPI   Reports experiencing pain behind right ear, headache since    Past Medical History:  Diagnosis Date   Allergy Child   Anemia    Arthritis    Asthma    GERD (gastroesophageal reflux disease) 10/22   Hiatal hernia    Osteoporosis    Ulcer 6/23   Vaginal delivery    x 2, 1985, 1990     Social History   Tobacco Use   Smoking status: Never    Passive exposure: Never   Smokeless tobacco: Never  Vaping Use   Vaping status: Never Used  Substance Use Topics   Alcohol use: Yes    Alcohol/week: 2.0 standard drinks of alcohol    Types: 2 Standard drinks or equivalent per week   Drug use: Not Currently    Types: Marijuana    Comment: THC for sleep    Past Surgical History:  Procedure Laterality Date   COLONOSCOPY  01/23/2022   normal - 2012   LIPOMA EXCISION Right 03/27/2023   back   NASAL SINUS SURGERY Bilateral 2015   OTHER SURGICAL HISTORY     lipoma removed from back 03/2023    Family History  Problem Relation Age of Onset   Colon cancer Mother    Cancer Mother    Hearing loss Mother    Heart failure Father    Asthma Father    COPD Father    Hearing loss Father    Crohn's disease Sister    Pancreatic cancer Other    Ovarian cancer Other    Uterine cancer Other     Allergies  Allergen Reactions   Tape Rash    Current Medications:   Current Outpatient Medications:    albuterol (VENTOLIN HFA) 108 (90 Base) MCG/ACT inhaler, Inhale 1 puff into the lungs every 6 (six) hours as needed., Disp: , Rfl:    alendronate (FOSAMAX) 70 MG tablet, Take 1 tablet (70 mg total) by mouth every 7 (seven) days. Take with a full glass of water on an empty stomach., Disp: 12 tablet, Rfl: 4   Ascorbic Acid (VITAMIN C CR) 500 MG TBCR, Take 1 tablet by mouth every other day., Disp: , Rfl:    azithromycin (ZITHROMAX) 250 MG tablet, Take as directed,  Disp: 6 tablet, Rfl: 0   benzonatate (TESSALON) 100 MG capsule, Take 1 capsule (100 mg total) by mouth 3 (three) times daily as needed., Disp: 30 capsule, Rfl: 1   Cannabinoids (THC FREE PO), Take 2.5 mg by mouth daily in the afternoon., Disp: , Rfl:    Cetirizine HCl (ZYRTEC PO), Take by mouth., Disp: , Rfl:    Cholecalciferol (VITAMIN D3 ADULT GUMMIES PO), , Disp: , Rfl:    Cyanocobalamin (VITAMIN B-12 PO), Take 3,000 mcg by mouth 2 (two) times a week., Disp: , Rfl:    estradiol (ESTRACE VAGINAL) 0.1 MG/GM vaginal cream, Place 1 g vaginally 3 (three) times a week., Disp: 42.5 g, Rfl: 12   famotidine (PEPCID) 20 MG tablet, Take 20 mg by mouth 2 (two) times daily., Disp: , Rfl:    ferrous sulfate 324 MG TBEC, Take 324 mg by mouth every other day., Disp: , Rfl:    fluticasone (FLONASE) 50 MCG/ACT nasal spray, Place 1 spray into both nostrils daily., Disp: , Rfl:    fluticasone-salmeterol (ADVAIR DISKUS) 250-50 MCG/ACT AEPB,  Inhale 1 puff into the lungs in the morning and at bedtime., Disp: 60 each, Rfl: 6   ipratropium (ATROVENT) 0.03 % nasal spray, Place 2 sprays into both nostrils every 12 (twelve) hours., Disp: 30 mL, Rfl: 12   MAGNESIUM GLYCINATE PO, , Disp: , Rfl:    Melatonin 5 MG CHEW, Chew by mouth., Disp: , Rfl:    montelukast (SINGULAIR) 10 MG tablet, TAKE 1 TABLET BY MOUTH EVERYDAY AT BEDTIME, Disp: 90 tablet, Rfl: 3   Multiple Vitamins-Minerals (MULTIVITAMIN ADULT) CHEW, , Disp: , Rfl:    traZODone (DESYREL) 100 MG tablet, Take 1 tablet (100 mg total) by mouth at bedtime., Disp: 90 tablet, Rfl: 3   Review of Systems:   ROS  Vitals:   There were no vitals filed for this visit.   There is no height or weight on file to calculate BMI.  Physical Exam:   Physical Exam  Assessment and Plan:   ***   I,Alexander Ruley,acting as a scribe for Jarold Motto, PA.,have documented all relevant documentation on the behalf of Jarold Motto, PA,as directed by  Jarold Motto, PA  while in the presence of Jarold Motto, [place].   ***   Jarold Motto, PA-C

## 2023-06-20 ENCOUNTER — Encounter: Payer: Self-pay | Admitting: Physician Assistant

## 2023-06-20 ENCOUNTER — Ambulatory Visit: Payer: BC Managed Care – PPO | Admitting: Physician Assistant

## 2023-06-20 VITALS — BP 138/80 | HR 83 | Temp 97.8°F | Ht 62.5 in | Wt 165.0 lb

## 2023-06-20 DIAGNOSIS — G4452 New daily persistent headache (NDPH): Secondary | ICD-10-CM | POA: Diagnosis not present

## 2023-06-20 NOTE — Patient Instructions (Signed)
It was great to see you!  I'm going to order MRI for further evaluation; possibly CT scan if insurance requires this  We will be in touch with results  If blood pressure remains elevated, let us know  Take care,  Jarold Motto PA-C

## 2023-07-05 ENCOUNTER — Encounter (INDEPENDENT_AMBULATORY_CARE_PROVIDER_SITE_OTHER): Payer: Self-pay

## 2023-07-08 ENCOUNTER — Other Ambulatory Visit: Payer: Self-pay | Admitting: Physician Assistant

## 2023-07-10 ENCOUNTER — Ambulatory Visit
Admission: RE | Admit: 2023-07-10 | Discharge: 2023-07-10 | Disposition: A | Discharge status: Home / Self Care | Payer: BC Managed Care – PPO | Source: Ambulatory Visit | Attending: Physician Assistant | Admitting: Physician Assistant

## 2023-07-10 DIAGNOSIS — R519 Headache, unspecified: Secondary | ICD-10-CM | POA: Diagnosis not present

## 2023-07-10 DIAGNOSIS — G4452 New daily persistent headache (NDPH): Secondary | ICD-10-CM

## 2023-07-12 ENCOUNTER — Ambulatory Visit: Payer: BC Managed Care – PPO | Admitting: Family Medicine

## 2023-07-12 ENCOUNTER — Encounter: Payer: Self-pay | Admitting: Family Medicine

## 2023-07-12 ENCOUNTER — Other Ambulatory Visit: Payer: Self-pay

## 2023-07-12 VITALS — BP 132/78 | HR 84 | Ht 62.5 in | Wt 163.0 lb

## 2023-07-12 DIAGNOSIS — M7751 Other enthesopathy of right foot: Secondary | ICD-10-CM | POA: Diagnosis not present

## 2023-07-12 DIAGNOSIS — M25571 Pain in right ankle and joints of right foot: Secondary | ICD-10-CM

## 2023-07-12 MED ORDER — NITROGLYCERIN 0.2 MG/HR TD PT24: MEDICATED_PATCH | TRANSDERMAL | 1 refills | Status: DC

## 2023-07-12 NOTE — Patient Instructions (Addendum)
Thank you for coming in today.   Nitroglycerin Protocol Apply 1/4 nitroglycerin patch to affected area daily. Change position of patch within the affected area every 24 hours. You may experience a headache during the first 1-2 weeks of using the patch, these should subside. If you experience headaches after beginning nitroglycerin patch treatment, you may take your preferred over the counter pain reliever. Another side effect of the nitroglycerin patch is skin irritation or rash related to patch adhesive. Please notify our office if you develop more severe headaches or rash, and stop the patch. Tendon healing with nitroglycerin patch may require 12 to 24 weeks depending on the extent of injury. Men should not use if taking Viagra, Cialis, or Levitra.  Do not use if you have migraines or rosacea.    I recommend you obtained a compression sleeve to help with your joint problems. There are many options on the market however I recommend obtaining a Full Ankle Body Helix compression sleeve.  You can find information (including how to appropriate measure yourself for sizing) can be found at www.Body [employer].ch.  Many of these products are health savings account (HSA) eligible.  You can use the compression sleeve at any time throughout the day but is most important to use while being active as well as for 2 hours post-activity.   It is appropriate to ice following activity with the compression sleeve in place.   Please complete the exercises that the athletic trainer went over with you:  View at www.my-exercise-code.com using code: [facility]  Check back in 4 weeks

## 2023-07-12 NOTE — Progress Notes (Signed)
          I, Stevenson Clinch, CMA acting as a scribe for Lindsey Graham, MD.  Lindsey Knight is a 62 y.o. female who presents to [facility] today for ankle pain. Pt was previously seen by Dr. Denyse Knight on 04/09/23 for R thumb trigger finger.  Today, pt c/o ankle pain ongoing for more than 2 months. Pt locates pain to the right ankle, lateral aspect. Pain x 2 months. Sx wax and wane, worse at night when relaxing. Feels tight and swollen. Did a lot of walking yesterday at a concert in [place]. Pain with ROM. Does not recall any injury to the ankle, does note going for a long walk the day before sx started but she was doing this on a regular basis and nothing out of the norm happened. Notes worsening pain with compression. Has used ice intermittently for swelling.    Ankle swelling: yes Aggravates: WB, rest, ROM Treatments tried: IBU, Tylenol  Pertinent review of systems: No fevers or chills  Relevant historical information: History of colon cancer Crohn's disease  Exam:  BP 132/78   Pulse 84   Ht 5' 2.5" (1.588 m)   Wt 163 lb (73.9 kg)   SpO2 96%   BMI 29.34 kg/m  General: Well Developed, well nourished, and in no acute distress.   MSK: Right ankle some swelling at lateral ankle.  Tender palpation along the course of the peroneal tendons at lateral malleolus. Normal foot and ankle motion. Pain with resisted foot eversion is present. Stable ligamentous exam. Pulses cap refill sensation are intact distally.    Lab and Radiology Results  Diagnostic Limited MSK Ultrasound of: Right lateral ankle Peroneal tendons are intact however there is hypoechoic fluid tracking along the tendon sheath at the level of the lateral malleolus.  The peroneal brevis tendon is crescent-shaped with likely a linear split tear at about the level of the lateral malleolus.  No retracted full-thickness tear is present. Impression: Tenosynovitis peroneal tendon with probable linear split  tear     Assessment and Plan: 62 y.o. female with right lateral ankle pain due to peroneal tenosynovitis with a concern for a linear split tear.  Plan for home exercise program taught clinic today, compression sleeve, and nitroglycerin patch protocol.  Recheck in about a month. If not improved consider formal physical therapy or injection.  PDMP not reviewed this encounter. Orders Placed This Encounter  Procedures   [place] LIMITED JOINT SPACE STRUCTURES LOW RIGHT(NO LINKED CHARGES)    Order Specific Question:   Reason for Exam (SYMPTOM  OR DIAGNOSIS REQUIRED)    Answer:   right ankle pain, lateral aspect    Order Specific Question:   Preferred imaging location?    Answer:   Adult nurse [facility] ordered this encounter  Medications   nitroGLYCERIN (NITRODUR - DOSED IN MG/24 HR) 0.2 mg/hr patch    Sig: Apply 1/4 patch daily to tendon for tendonitis.    Dispense:  30 patch    Refill:  1     Discussed warning signs or symptoms. Please see discharge instructions. Patient expresses understanding.   The above documentation has been reviewed and is accurate and complete Lindsey Knight, M.D.

## 2023-07-16 ENCOUNTER — Encounter: Payer: Self-pay | Admitting: Physician Assistant

## 2023-07-17 ENCOUNTER — Other Ambulatory Visit: Payer: Self-pay | Admitting: *Deleted

## 2023-07-17 ENCOUNTER — Other Ambulatory Visit: Payer: Self-pay | Admitting: Physician Assistant

## 2023-07-17 ENCOUNTER — Ambulatory Visit (HOSPITAL_COMMUNITY)
Admission: RE | Admit: 2023-07-17 | Discharge: 2023-07-17 | Disposition: A | Discharge status: Home / Self Care | Payer: BC Managed Care – PPO | Source: Ambulatory Visit | Attending: Physician Assistant | Admitting: Physician Assistant

## 2023-07-17 ENCOUNTER — Encounter: Payer: Self-pay | Admitting: Physician Assistant

## 2023-07-17 DIAGNOSIS — J3489 Other specified disorders of nose and nasal sinuses: Secondary | ICD-10-CM

## 2023-07-17 DIAGNOSIS — R930 Abnormal findings on diagnostic imaging of skull and head, not elsewhere classified: Secondary | ICD-10-CM | POA: Diagnosis not present

## 2023-07-17 DIAGNOSIS — G4452 New daily persistent headache (NDPH): Secondary | ICD-10-CM

## 2023-07-17 MED ORDER — AMOXICILLIN-POT CLAVULANATE 875-125 MG PO TABS
1.0000 | ORAL_TABLET | Freq: Two times a day (BID) | ORAL | 0 refills | Status: DC
Start: 2023-07-17 — End: 2023-07-18

## 2023-07-17 MED ORDER — IOHEXOL 300 MG/ML  SOLN
75.0000 mL | Freq: Once | INTRAMUSCULAR | Status: AC | PRN
  Administered 2023-07-17: 75 mL via INTRAVENOUS

## 2023-07-17 NOTE — Telephone Encounter (Signed)
Lindsey Knight, Radiology called report is in please read Impression # 2.

## 2023-07-17 NOTE — Telephone Encounter (Signed)
Called Radiology and spoke to Ray asked him to have MRI read from 8/20. Ray said he will have them read it.

## 2023-07-17 NOTE — Telephone Encounter (Signed)
Received call from Radiology Lindsey Knight, said report is read and in system make sure provider reads Impression # 2. Told him okay, thanks.

## 2023-07-18 ENCOUNTER — Other Ambulatory Visit: Payer: Self-pay | Admitting: Physician Assistant

## 2023-07-18 ENCOUNTER — Telehealth: Payer: Self-pay | Admitting: Otolaryngology

## 2023-07-18 MED ORDER — PREDNISONE 20 MG PO TABS: 20.0000 mg | ORAL_TABLET | Freq: Two times a day (BID) | ORAL | 0 refills | Status: DC

## 2023-07-18 MED ORDER — AMOXICILLIN-POT CLAVULANATE 875-125 MG PO TABS: 1.0000 | ORAL_TABLET | Freq: Two times a day (BID) | ORAL | 0 refills | Status: DC

## 2023-07-18 NOTE — Telephone Encounter (Signed)
Misty Stanley from [facility] called on behalf of provider, Jarold Motto, to set up appt for patient.  I booked an appt for 08/07/23, but provider would like patient seen as soon as possible.  Is there a day/time you would like the patient moved to?  Please advise.

## 2023-07-18 NOTE — Telephone Encounter (Signed)
Please see message, pharmacy is updated. I do not see what other medication you sent in?

## 2023-08-07 ENCOUNTER — Encounter (INDEPENDENT_AMBULATORY_CARE_PROVIDER_SITE_OTHER): Payer: Self-pay | Admitting: Otolaryngology

## 2023-08-07 ENCOUNTER — Ambulatory Visit (INDEPENDENT_AMBULATORY_CARE_PROVIDER_SITE_OTHER): Payer: BC Managed Care – PPO | Admitting: Otolaryngology

## 2023-08-07 VITALS — BP 149/88 | HR 80 | Ht 63.0 in | Wt 162.2 lb

## 2023-08-07 DIAGNOSIS — J339 Nasal polyp, unspecified: Secondary | ICD-10-CM | POA: Diagnosis not present

## 2023-08-07 DIAGNOSIS — J343 Hypertrophy of nasal turbinates: Secondary | ICD-10-CM | POA: Diagnosis not present

## 2023-08-07 DIAGNOSIS — J342 Deviated nasal septum: Secondary | ICD-10-CM | POA: Diagnosis not present

## 2023-08-07 DIAGNOSIS — J3089 Other allergic rhinitis: Secondary | ICD-10-CM

## 2023-08-07 DIAGNOSIS — J329 Chronic sinusitis, unspecified: Secondary | ICD-10-CM | POA: Diagnosis not present

## 2023-08-07 MED ORDER — DOXYCYCLINE HYCLATE 100 MG PO TABS: 100.0000 mg | ORAL_TABLET | Freq: Two times a day (BID) | ORAL | 0 refills | Status: DC

## 2023-08-07 MED ORDER — METHYLPREDNISOLONE 4 MG PO TBPK: ORAL_TABLET | ORAL | 1 refills | Status: DC

## 2023-08-07 NOTE — Patient Instructions (Addendum)
-   CT sinuses  - you will get a call from compounding pharmacy to get steroids for nasal rinses - take Doxycycline and Medrol Dose pack - you will get a call from surgery scheduler to set up the surgery  Lloyd Huger Med Nasal Saline Rinse   - start nasal saline rinses with [employer] available over the counter or online to help with nasal congestion

## 2023-08-07 NOTE — Progress Notes (Signed)
ENT CONSULT:  Reason for Consult: chronic headaches and sinusitis on CT max/face    HPI: Lindsey Knight is an 62 y.o. female with hx of chronic nasal congestion, frequent sinus infections for several decades and chronic headaches, here for initial evaluation of her sx. She has a longstanding history of sinus infections and allergy symptoms dating back several decades ago.  She received allergy shots for years in [place], moved to [facility] and allergy testing was negative, then moved to [place], again allergy skin testing was negative per report . she then started to have recurrent sinus infections, required multiple courses of antibiotics and steroids. She then had sinus surgery 13 yrs ago, and after sinus surgery her sinus infections became less frequent.  She reports that her sinus surgeon did IgE levels at the time and IgA levels were low.  She is on Zyrtec daily and uses Atrovent spray and Flonase nasal spray BID. At night she takes Singulair. She uses albuterol when her symptoms worsen. She took abx and steroids after her most recent bout of sinus infection and currently feeling better.  Denies history of allergies to aspirin or NSAIDs.  She had CT max face/MRI brain ordered due to persistent headache. MRI brain was concerning for potential defect at cribriform plate and bilateral sinusitis.  CT max face was ordered in follow-up and it did not show a defect at the skull base, but demonstrated bilateral sinusitis involving maxillary and ethmoid sinuses and evidence of prior sinus surgery.  Records Reviewed:  Office visit with Dr Derek Jack 05/18/23 Lindsey Knight is a 62 year old woman, never smoker with history of asthma who returns to pulmonary clinic for cough.    She reports sinus drainage with post nasal drip and some sinus pressure over recent weeks. She has significant cough that can keep her up at night. She is using advair 250-31mcg 1 puff twice daily and as needed albuterol. She is using flonase 1 spray  per nostril daily. She continues to have reflux symptoms despite famotidine 20mg  twice daily.    OV 10/09/22 She reports increased cough over the past 2 months. She describes an event where she was bending over in the community garden and possibly had a significant reflux episode. She denies any viral illnesses recently.    She saw GI 08/01/22 with plan to reduce PPI dosing then transition to famotidine for 2 weeks then to use as needed. She reports the famotidine did not help, she reports burning sensation in her chest and throat. She then started omeprazole OTC which seems to have reduced her reflux symptoms.    She has advair 250-45mcg 1 puff twice daily that she has been using as needed. She has albuterol as needed as well. She has not felt much relief.    She has not been on steroids or antibiotics.    OV 03/06/22 Chest x-ray today shows clearing of the right upper lobe infiltrates. Will await final radiology read.    She is using advair 100-40mcg 1 puff twice daily as needed along with daily allegra and montelukast for her asthma. No night time awakenings. No issues with spring allergies.   She has been feeling well since last visit. He cough has significantly decreased. She recently had EGD and C-scope and started on PPI therapy for GERD.    OV 01/03/22 Patient reports having respiratory illness in mid-December 2022 and was treated with Zpak on 12/13. PCP note from 11/08/2021 reviewed. Chest x-ray on 12/30 showed increased lung markings  with mild right mid lung and left basilar linear scaring or atelectasis. She was also noted to have increased platelet count. She had CT Chest on 11/29/21 that showed mediastinal, axillary and hilar lymphadenopathy along with patchy ground-glass and adjacent focal clustered nodularity in the right upper lobe and adjacent band like atelectasis/scarring at the junction of the right upper lobe.    Patient reports resolution of her infectious symptoms from  December and is feeling at her baseline. She will occasionally have some right sided discomfort but it is overall improved from December. She notices the pain when laying down or moving when she sleeps. She denies any shortness of breath, fevers, chills or night sweats. She denies hemoptysis. She does have joint aches in general which include her knees, hips and occasional hand stiffness in the mornings.   She has history of asthma and mainly has issues with reactive airways disease when triggered by viral infections and uses inhalers as needed. She denies issues with seasonal allergies. She had sinus surgery in the past with significant reduction in frequency of sinus infections.    She is a never smoker. She is a retired Engineer, civil (consulting). She has a sister with crohn's disease and her mother had colon cancer.        Office note by Jarold Motto PA Pt c/o headache x 1 week on right side of head behind ear. Took 2 extra strength Tylenol last night with relief. Has pain now  Headache She has been having an intermittent headache since 06/13/23.  She is taking extra strength Tylenol morning and evening with some relief. Took Sudafed two days ago without relief. Pain is located behind right ear. Rates pain 7/10 and describes as sharp throbbing pain. No change in lifestyle, eating, stress. Denies any recent head trauma. This kind of headache is unusual for her. She is not a "headache" person. Treated recently for maxillary sinusitis on 05/18/23.   Feels somewhat disoriented, especially on the right side of her vision field. 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 She has been taking alendronate for one year - unsure if this is contributing to her symptom(s).    No family history of stroke. Denies chest pain, shortness of breath. No change in bedding, pillows. Has not had her yearly eye exam for this year.   Denies rash. Had bilateral nasal sinus surgery in 2015.  Denies sinus  pressure, flu symptoms.   New daily persistent headache Neuro exam is wnl No red flags on my exam Will obtain MR without contrast to further evaluate given persistence and age >76 If new/worsening symptom(s), likely will need to go to the ER Consider neurology referral    Past Medical History:  Diagnosis Date   Allergy Child   Anemia    Arthritis    Asthma    GERD (gastroesophageal reflux disease) 10/22   Hiatal hernia    Osteoporosis    Ulcer 6/23   Vaginal delivery    x 2, 1985, 1990    Past Surgical History:  Procedure Laterality Date   COLONOSCOPY  01/23/2022   normal - 2012   LIPOMA EXCISION Right 03/27/2023   back   NASAL SINUS SURGERY Bilateral 2015   OTHER SURGICAL HISTORY     lipoma removed from back 03/2023    Family History  Problem Relation Age of Onset   Colon cancer Mother    Cancer Mother    Hearing loss Mother    Heart failure Father  Asthma Father    COPD Father    Hearing loss Father    Crohn's disease Sister    Pancreatic cancer Other    Ovarian cancer Other    Uterine cancer Other     Social History:  reports that she has never smoked. She has never been exposed to tobacco smoke. She has never used smokeless tobacco. She reports current alcohol use of about 2.0 standard drinks of alcohol per week. She reports that she does not currently use drugs after having used the following drugs: Marijuana.  Allergies:  Allergies  Allergen Reactions   Tape Rash    Medications: I have reviewed the patient's current medications.  The PMH, PSH, Medications, Allergies, and SH were reviewed and updated.  ROS: Constitutional: Negative for fever, weight loss and weight gain. Cardiovascular: Negative for chest pain and dyspnea on exertion. Respiratory: Is not experiencing shortness of breath at rest. Gastrointestinal: Negative for nausea and vomiting. Neurological: Negative for headaches. Psychiatric: The patient is not nervous/anxious  Blood  pressure (!) 149/88, pulse 80, height 5\' 3"  (1.6 m), weight 162 lb 3.2 oz (73.6 kg), SpO2 95%.  PHYSICAL EXAM:  Exam: General: Well-developed, well-nourished Communication and Voice: Clear pitch and clarity Respiratory Respiratory effort: Equal inspiration and expiration without stridor Cardiovascular Peripheral Vascular: Warm extremities with equal color/perfusion Eyes: No nystagmus with equal extraocular motion bilaterally Neuro/Psych/Balance: Patient oriented to person, place, and time; Appropriate mood and affect; Gait is intact with no imbalance; Cranial nerves I-XII are intact Head and Face Inspection: Normocephalic and atraumatic without mass or lesion Palpation: Facial skeleton intact without bony stepoffs Salivary Glands: No mass or tenderness Facial Strength: Facial motility symmetric and full bilaterally ENT Pinna: External ear intact and fully developed External canal: Canal is patent with intact skin Tympanic Membrane: Clear and mobile External Nose: No scar or anatomic deformity Internal Nose: Septum is deviated to the left. Bilateral nasal polyps, (+) purulence left middle meatus. Mucosal edema and erythema present.  Bilateral inferior turbinate hypertrophy.  Lips, Teeth, and gums: Mucosa and teeth intact and viable TMJ: No pain to palpation with full mobility Oral cavity/oropharynx: No erythema or exudate, no lesions present Nasopharynx: no masses or lesions mucosa intact Neck Neck and Trachea: Midline trachea without mass or lesion Thyroid: No mass or nodularity Lymphatics: No lymphadenopathy  Procedure:   PROCEDURE NOTE: nasal endoscopy  Preoperative diagnosis: chronic sinusitis symptoms  Postoperative diagnosis: same  Procedure: Diagnostic nasal endoscopy (16109)  Surgeon: Ashok Croon, M.D.  Anesthesia: Topical lidocaine and Afrin  H&P REVIEW: The patient's history and physical were reviewed today prior to procedure. All medications were  reviewed and updated as well. Complications: None Condition is stable throughout exam Indications and consent: The patient presents with symptoms of chronic sinusitis not responding to previous therapies. All the risks, benefits, and potential complications were reviewed with the patient preoperatively and informed consent was obtained. The time out was completed with confirmation of the correct procedure.   Procedure: The patient was seated upright in the clinic. Topical lidocaine and Afrin were applied to the nasal cavity. After adequate anesthesia had occurred, the rigid nasal endoscope was passed into the nasal cavity. The nasal mucosa, turbinates, septum, and sinus drainage pathways were visualized bilaterally. This revealed some purulence in the left middle meatus and near left choana. There were bilateral nasal polyps extending to the level of the middle turbinates. The mucosa was intact and there was no crusting present. The scope was then slowly withdrawn and the patient  tolerated the procedure well. There were no complications or blood loss.  Studies Reviewed:CT max/face CLINICAL DATA:  Sinus mass on recent MRI.   EXAM: CT MAXILLOFACIAL WITH CONTRAST   TECHNIQUE: Multidetector CT imaging of the maxillofacial structures was performed with intravenous contrast. Multiplanar CT image reconstructions were also generated.   RADIATION DOSE REDUCTION: This exam was performed according to the departmental dose-optimization program which includes automated exposure control, adjustment of the mA and/or kV according to patient size and/or use of iterative reconstruction technique.   CONTRAST:  75mL OMNIPAQUE IOHEXOL 300 MG/ML  SOLN   COMPARISON:  Brain MRI 07/10/2023   FINDINGS: Osseous: No fracture or destructive changes   Orbits: Unremarkable   Sinuses: Essentially confluent opacification of maxillary, ethmoid, and frontal sinuses bilaterally with diffuse obstruction of the upper  nasal cavity by mucosal thickening. Partially mineralized debris is seen in the maxillary sinuses and extending towards the widened ostia. No clear destructive changes, asymmetry at the left cribriform plate on prior brain MRI is likely due to the higher left fovea ethmoidalis, a normal variant. No invasive features are seen. The nasal septum is nearly midline.   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 No destructive or invasive features.  MRI Brain EXAM: MRI HEAD WITHOUT CONTRAST   TECHNIQUE: Multiplanar, multiecho pulse sequences of the brain and surrounding structures were obtained without intravenous contrast.   COMPARISON:  None Available.   FINDINGS: Brain: Negative for acute infarct. Small microhemorrhages in the inferomedial right frontal and left frontal lobe. No hydrocephalus. No extra-axial fluid collection. No mass effect. No mass lesion. Sequela of mild chronic microvascular ischemic change.   Vascular: Normal flow voids.   Skull and upper cervical spine: Normal marrow signal.   Sinuses/Orbits: No middle ear or mastoid effusion. There is near-complete opacification of the bilateral maxillary, ethmoid, and frontal sinuses. There is partial opacification of the bilateral sphenoid sinuses. The cribriform plate appears somewhat irregular and there appears to be mass effect on the olfactory sulcus (series 117, image 63). The integrity of the lamina papyracea on the left is also uncertain (series 117, image 56).   Other: None.   IMPRESSION: 1. No acute intracranial abnormality. 2. Near-complete opacification of the bilateral maxillary, ethmoid, and frontal sinuses. The cribriform plate appears somewhat irregular and there appears to be mass effect on the olfactory sulcus. The integrity of the lamina papyracea on the left is also uncertain. Recommend  further evaluation with a dedicated contrast-enhanced maxillofacial CT.  Assessment/Plan: Encounter Diagnoses  Name Primary?   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 and inferior turbinate hypertrophy - s/p FESS several years ago, with sx recurrence and evidence of severe inflammation bilateral maxillary and ethmoid sinuses -Initially had MRI brain which showed chronic sinus disease and potential irregularity at cribriform plat, but max face CT done following MRI brain did not reveal any bony defects in the skull base including area of concern along the cribriform plate -I discussed findings of both imaging studies with the patient -Based on my evaluation today including nasal endoscopy she has evidence of polyp regrowth and chronic nasal congestion along with septal deviation inferior turban hypertrophy and purulent secretions along the left middle meatus consistent with chronic rhinosinusitis with nasal  polyps Plan  - she had recent CBC with diff, will order total IgE - CT sinuses brainlab protocol - CT max face with fewer images - need for image guidance during surgery  - nasal steroid rinses with Lloyd Huger Med nasal saline/mometasone to shrink the polyps and help with sx post-op -  Doxycycline 100 mg BID for sinus infection and Medrol Dose pack (4 mg tab pack, take as directed)  - Allergy referral for consideration to receive biologic such as Dupixent vs Xolair (hx of asthma) - I discussed management of CRSwNP and importance of surgical options and medical management of allergies and chronic inflammation - we discussed risks and benefits of surgery and she would like to proceed - will schedule for revision sinus surgery (bilateral revision FESS, maxillary antrostomy b/l, vs balloon sinuplasty, bilateral  total ethmoidectomy, bilateral nasal polypectomy, possible inferior turbinate reduction, possible septoplasty) -She reports history of normal IgA when she had her initial work[place], she inquired about the significance of the test result, I advised her to have immunoglobulin levels checked with an allergist, who can better discussed the significance of it if her levels are abnormal  2. Environmental allergies and hx of asthma -Continue Zyrtec 10 mg daily -Continue Atrovent nasal spray -Stop Flonase after initiation of nasal steroid rinses -Continue Singulair  3. Asthma  -Continue albuterol as needed -Continue Singulair 10 mg daily -See pulmonary as needed established with Melody Comas, MD  Thank you for allowing me to participate in the care of this patient. Please do not hesitate to contact me with any questions or concerns.   Ashok Croon, MD Otolaryngology [facility] Phone: [phone] Fax: [phone]    08/07/2023, 7:54 PM

## 2023-08-09 ENCOUNTER — Ambulatory Visit: Payer: BC Managed Care – PPO | Admitting: Family Medicine

## 2023-08-09 ENCOUNTER — Ambulatory Visit (INDEPENDENT_AMBULATORY_CARE_PROVIDER_SITE_OTHER): Payer: BC Managed Care – PPO

## 2023-08-09 ENCOUNTER — Other Ambulatory Visit: Payer: Self-pay

## 2023-08-09 VITALS — BP 158/86 | HR 94 | Ht 63.0 in | Wt 161.0 lb

## 2023-08-09 DIAGNOSIS — M7661 Achilles tendinitis, right leg: Secondary | ICD-10-CM | POA: Diagnosis not present

## 2023-08-09 DIAGNOSIS — M7751 Other enthesopathy of right foot: Secondary | ICD-10-CM

## 2023-08-09 DIAGNOSIS — M7731 Calcaneal spur, right foot: Secondary | ICD-10-CM | POA: Diagnosis not present

## 2023-08-09 DIAGNOSIS — M25571 Pain in right ankle and joints of right foot: Secondary | ICD-10-CM | POA: Diagnosis not present

## 2023-08-09 DIAGNOSIS — Z1231 Encounter for screening mammogram for malignant neoplasm of breast: Secondary | ICD-10-CM | POA: Diagnosis not present

## 2023-08-09 LAB — HM MAMMOGRAPHY

## 2023-08-09 NOTE — Patient Instructions (Addendum)
Thank you for coming in today.   Continue compression sleeve and home exercises.   Add PT.   Please get an Xray today before you leave   We can do an MRI in 2 weeks if not better.   Let me know.

## 2023-08-09 NOTE — Progress Notes (Signed)
   Rubin Payor, PhD, LAT, ATC acting as a scribe for Clementeen Graham, MD.  Lindsey Knight is a 62 y.o. female who presents to [facility] today for f/u R ankle pain thought to be due to peroneal tenosynovitis with a concern for a linear split tear. Pt was last seen by Dr. [name] on 07/12/23 prescribed nitro patches, advised to use compression, and taught HEP.   Today, pt reports swelling has improved a bit, but she is still having a sharp pain along the lateral aspect of her R ankle. Pain especially w/ iNv. She notes a reaction to the patches, but did try to use them for 3 wks.   Pertinent review of systems: No fevers or chills  Relevant historical information: History of colon cancer and Crohn's disease   Exam:  BP (!) 158/86   Pulse 94   Ht 5\' 3"  (1.6 m)   Wt 161 lb (73 kg)   SpO2 97%   BMI 28.52 kg/m  General: Well Developed, well nourished, and in no acute distress.   MSK: Right ankle some swelling posterior lateral ankle.  Tender palpation.  Normal motion.    Lab and Radiology Results  X-ray images right ankle obtained today personally and independently interpreted No acute fracture. No severe DJD Await formal radiology review    Assessment and Plan: 62 y.o. female with right ankle pain.  Patient was seen about a month ago for lateral ankle pain thought to be peroneal tendon split tear.  She treated conservatively with home exercise program compression sleeve and nitroglycerin patch protocol.  Unfortunately this is not working well enough.  Will add formal physical therapy and continue home exercise program.  If not improving in a few weeks (at least 6 weeks from first visit on August 22) she will let me know and we can do an MRI.  She may be willing to consider surgery but she would like to do it in 2024 if possible.   PDMP not reviewed this encounter. Orders Placed This Encounter  Procedures   DG Ankle Complete Right    Standing Status:    Future    Number of Occurrences:   1    Standing Expiration Date:   08/08/2024    Order Specific Question:   Reason for Exam (SYMPTOM  OR DIAGNOSIS REQUIRED)    Answer:   eval ankle pain    Order Specific Question:   Preferred imaging location?    Answer:   [facility]   Ambulatory referral to Physical Therapy    Referral Priority:   Routine    Referral Type:   Physical Medicine    Referral Reason:   Specialty Services Required    Requested Specialty:   Physical Therapy    Number of Visits Requested:   1   No orders of the defined types were placed in this encounter.    Discussed warning signs or symptoms. Please see discharge instructions. Patient expresses understanding.   The above documentation has been reviewed and is accurate and complete Clementeen Graham, M.D.

## 2023-08-10 ENCOUNTER — Other Ambulatory Visit (INDEPENDENT_AMBULATORY_CARE_PROVIDER_SITE_OTHER): Payer: BC Managed Care – PPO

## 2023-08-10 ENCOUNTER — Encounter: Payer: Self-pay | Admitting: Nurse Practitioner

## 2023-08-10 ENCOUNTER — Encounter: Payer: Self-pay | Admitting: Physician Assistant

## 2023-08-10 ENCOUNTER — Encounter: Payer: Self-pay | Admitting: Family Medicine

## 2023-08-10 ENCOUNTER — Telehealth: Payer: BC Managed Care – PPO | Admitting: Nurse Practitioner

## 2023-08-10 ENCOUNTER — Encounter (INDEPENDENT_AMBULATORY_CARE_PROVIDER_SITE_OTHER): Payer: Self-pay | Admitting: Otolaryngology

## 2023-08-10 ENCOUNTER — Other Ambulatory Visit: Payer: Self-pay | Admitting: Physician Assistant

## 2023-08-10 VITALS — Temp 97.7°F

## 2023-08-10 DIAGNOSIS — U071 COVID-19: Secondary | ICD-10-CM | POA: Diagnosis not present

## 2023-08-10 DIAGNOSIS — J339 Nasal polyp, unspecified: Secondary | ICD-10-CM

## 2023-08-10 DIAGNOSIS — J329 Chronic sinusitis, unspecified: Secondary | ICD-10-CM

## 2023-08-10 LAB — RENAL FUNCTION PANEL
Albumin: 4.4 g/dL (ref 3.5–5.2)
BUN: 12 mg/dL (ref 6–23)
CO2: 25 mEq/L (ref 19–32)
Calcium: 9.2 mg/dL (ref 8.4–10.5)
Chloride: 103 mEq/L (ref 96–112)
Creatinine, Ser: 0.7 mg/dL (ref 0.40–1.20)
GFR: 92.15 mL/min (ref >60.00)
Glucose, Bld: 108 mg/dL — ABNORMAL HIGH (ref 70–99)
Phosphorus: 3.1 mg/dL (ref 2.3–4.6)
Potassium: 4 mEq/L (ref 3.5–5.1)
Sodium: 136 mEq/L (ref 135–145)

## 2023-08-10 MED ORDER — PROMETHAZINE-DM 6.25-15 MG/5ML PO SYRP: 5.0000 mL | ORAL_SOLUTION | Freq: Three times a day (TID) | ORAL | 0 refills | Status: DC | PRN

## 2023-08-10 MED ORDER — NIRMATRELVIR/RITONAVIR (PAXLOVID)TABLET: 3.0000 | ORAL_TABLET | Freq: Two times a day (BID) | ORAL | 0 refills | Status: AC

## 2023-08-10 NOTE — Addendum Note (Signed)
Addended by: Ninfa Meeker A on: 08/10/2023 12:11 PM   Modules accepted: Orders

## 2023-08-10 NOTE — Progress Notes (Signed)
Virtual Visit via Video Note  I connected withNAME@ on 08/10/23 at 11:20 AM EDT by a video enabled telemedicine application and verified that I am speaking with the correct person using two identifiers.  Location: Patient:Home Provider: Office Participants: patient and provider  I discussed the limitations of evaluation and management by telemedicine and the availability of in person appointments. I also discussed with the patient that there may be a patient responsible charge related to this service. The patient expressed understanding and agreed to proceed.  ZO:[id number] COVID at home  History of Present Illness:  URI  This is a new problem. The current episode started in the past 7 days. The problem has been unchanged. There has been no fever. Associated symptoms include congestion, coughing, headaches, joint pain, rhinorrhea, sinus pain and wheezing. Pertinent negatives include no abdominal pain, chest pain, diarrhea, dysuria, ear pain, joint swelling, nausea, neck pain, plugged ear sensation, rash, sneezing, sore throat, swollen glands or vomiting. She has tried antihistamine (medrol dose pack and doxycycline, tessalon perles) for the symptoms. The treatment provided no relief.    Observations/Objective: Physical Exam Constitutional:      General: She is not in acute distress. Pulmonary:     Effort: Pulmonary effort is normal.  Neurological:     Mental Status: She is alert and oriented to person, place, and time.     Assessment and Plan: Nimrah was seen today for covid positive.  Diagnoses and all orders for this visit:  COVID-19 -     Renal Function Panel -     promethazine-dextromethorphan (PROMETHAZINE-DM) 6.25-15 MG/5ML syrup; Take 5 mLs by mouth 3 (three) times daily as needed for cough. -     nirmatrelvir/ritonavir (PAXLOVID) 20 x 150 MG & 10 x 100MG  TABS; Take 3 tablets by mouth 2 (two) times daily for 5 days. (Take nirmatrelvir 150 mg two tablets twice daily for 5  days and ritonavir 100 mg one tablet twice daily for 5 days) Patient GFR is 92   Follow Up Instructions: Normal renal function Paxlovid sent Advised to hold advair while taking paxlovid and medrol dose pack Ok to continue mdrol dose pack and doxycycline Encourage adequate oral hydration. Use over-the-counter  cold" medicine  such as Dayquil/nyquil for cough and congestion.  Use mucinex DM or Robitussin  or delsym for cough without congestion  You can use plain "Tylenol" or "Advil" for fever, chills and achyness. Use cool mist humidifier at bedtime to help with nasal congestion and cough. Cold/cough medications may have tylenol or ibuprofen or guaifenesin or dextromethophan in them, so be careful not to take beyond the recommended dose for each of these medications.   I discussed the assessment and treatment plan with the patient. The patient was provided an opportunity to ask questions and all were answered. The patient agreed with the plan and demonstrated an understanding of the instructions.   The patient was advised to call back or seek an in-person evaluation if the symptoms worsen or if the condition fails to improve as anticipated.  Alysia Penna, NP

## 2023-08-10 NOTE — Patient Instructions (Signed)
Go to [address] for blood draw Will sent paxlovid  after review of lab results  Ok to continue mdrol dose pack and doxycycline  Encourage adequate oral hydration. Use over-the-counter  cold" medicine  such as Dayquil/nyquil for cough and congestion.  Use mucinex DM or Robitussin  or delsym for cough without congestion  You can use plain "Tylenol" or "Advil" for fever, chills and achyness. Use cool mist humidifier at bedtime to help with nasal congestion and cough.  Cold/cough medications may have tylenol or ibuprofen or guaifenesin or dextromethophan in them, so be careful not to take beyond the recommended dose for each of these medications.

## 2023-08-13 ENCOUNTER — Encounter: Payer: BC Managed Care – PPO | Admitting: Physician Assistant

## 2023-08-13 LAB — IGA: Immunoglobulin A: 84 mg/dL (ref 70–320)

## 2023-08-13 LAB — IGE: IgE (Immunoglobulin E), Serum: 2356 kU/L — ABNORMAL HIGH (ref <=114)

## 2023-08-13 NOTE — Therapy (Deleted)
OUTPATIENT PHYSICAL THERAPY LOWER EXTREMITY EVALUATION   Patient Name: Lindsey Knight MRN: [id number] DOB:02-10-1961, 62 y.o., female Today's Date: 08/13/2023  END OF SESSION:   Past Medical History:  Diagnosis Date   Allergy Child   Anemia    Arthritis    Asthma    GERD (gastroesophageal reflux disease) 10/22   Hiatal hernia    Osteoporosis    Ulcer 6/23   Vaginal delivery    x 2, 1985, 1990   Past Surgical History:  Procedure Laterality Date   COLONOSCOPY  01/23/2022   normal - 2012   LIPOMA EXCISION Right 03/27/2023   back   NASAL SINUS SURGERY Bilateral 2015   OTHER SURGICAL HISTORY     lipoma removed from back 03/2023   Patient Active Problem List   Diagnosis Date Noted   Trigger thumb, right thumb 07/13/2022   Colon cancer (HCC) 01/03/2022   Crohn's disease (HCC) 12/30/2021    PCP: Jarold Motto, PA  REFERRING PROVIDER: Rodolph Bong, MD   REFERRING DIAG: M77.51 (ICD-10-CM) - Tendinitis of right ankle  THERAPY DIAG:  No diagnosis found.  Rationale for Evaluation and Treatment: Rehabilitation  ONSET DATE: July 2024- no known MOI  SUBJECTIVE:   SUBJECTIVE STATEMENT: *** nitro patches, advised to use compression, and taught HEP.    Today, pt reports swelling has improved a bit, but she is still having a sharp pain along the lateral aspect of her R ankle. Pain especially w/ iNv. She notes a reaction to the patches, but did try to use them for 3 wks.   PERTINENT HISTORY: *** PAIN:  Are you having pain? Yes: NPRS scale: ***/10 Pain location: *** Pain description: *** Aggravating factors: *** Relieving factors: ***  PRECAUTIONS: {Therapy precautions:24002}  RED FLAGS: {PT Red Flags:29287}   WEIGHT BEARING RESTRICTIONS: {Yes ***/No:24003}  FALLS:  Has patient fallen in last 6 months? {fallsyesno:27318}  LIVING ENVIRONMENT: Lives with: {OPRC lives with:25569::"lives with their family"} Lives in: {Lives in:25570} Stairs:  {opstairs:27293} Has following equipment at home: {Assistive devices:23999}  OCCUPATION: ***  PLOF: {PLOF:24004}  PATIENT GOALS: ***  NEXT MD VISIT: ***  OBJECTIVE:   DIAGNOSTIC FINDINGS: right ankle xray 08/09/23***  PATIENT SURVEYS:  {rehab surveys:24030}  COGNITION: Overall cognitive status: {cognition:24006}     SENSATION: {sensation:27233}  EDEMA:  {edema:24020}  MUSCLE LENGTH: Hamstrings: Right *** deg; Left *** deg Thomas test: Right *** deg; Left *** deg  POSTURE: {posture:25561}  PALPATION: ***    LE Measurements Lower Extremity Right EVAL Left EVAL   A/PROM MMT A/PROM MMT  Hip Flexion      Hip Extension      Hip Abduction      Hip Adduction      Hip Internal rotation      Hip External rotation      Knee Flexion      Knee Extension      Ankle Dorsiflexion      Ankle Plantarflexion      Ankle Inversion      Ankle Eversion       (Blank rows = not tested) * pain   LOWER EXTREMITY SPECIAL TESTS:  {LEspecialtests:26242}  FUNCTIONAL TESTS:  {Functional tests:24029}  GAIT: Distance walked: *** Assistive device utilized: {Assistive devices:23999} Level of assistance: {Levels of assistance:24026} Comments: ***   TODAY'S TREATMENT:  DATE: ***  08/13/2023  Therapeutic Exercise:  Aerobic: Supine: Prone:  Seated:  Standing: Neuromuscular Re-education: Manual Therapy: Therapeutic Activity: Self Care: Trigger Point Dry Needling:  Modalities:    PATIENT EDUCATION:  Education details: on current presentation, on HEP, on clinical outcomes score and POC Person educated: Patient Education method: Explanation, Demonstration, and Handouts Education comprehension: verbalized understanding   HOME EXERCISE PROGRAM: ***  ASSESSMENT:  CLINICAL IMPRESSION: Patient is a *** y.o. *** who was seen today for physical  therapy evaluation and treatment for ***.   OBJECTIVE IMPAIRMENTS: {opptimpairments:25111}.   ACTIVITY LIMITATIONS: {activitylimitations:27494}  PARTICIPATION LIMITATIONS: {participationrestrictions:25113}  PERSONAL FACTORS: {Personal factors:25162} are also affecting patient's functional outcome.   REHAB POTENTIAL: {rehabpotential:25112}  CLINICAL DECISION MAKING: {clinical decision making:25114}  EVALUATION COMPLEXITY: {Evaluation complexity:25115}   GOALS: Goals reviewed with patient? yes  SHORT TERM GOALS: Target date: {follow up:25551} *** MAKE TEXT EDITABLE Patient will be independent in self management strategies to improve quality of life and functional outcomes. Baseline: New Program Goal status: INITIAL  2.  Patient will report at least 50% improvement in overall symptoms and/or function to demonstrate improved functional mobility Baseline: 0% better Goal status: INITIAL  3.  *** Baseline:  Goal status: INITIAL  4.  *** Baseline:  Goal status: INITIAL    LONG TERM GOALS: Target date: {follow up:25551} *** MAKE TEXT EDITABLE  Patient will report at least 75% improvement in overall symptoms and/or function to demonstrate improved functional mobility Baseline: 0% better Goal status: INITIAL  2.  Patient will improve score on FOTO outcomes measure to projected score to demonstrate overall improved function and QOL Baseline: see above Goal status: INITIAL  3.  *** Baseline:  Goal status: INITIAL  4.  *** Baseline:  Goal status: INITIAL    PLAN:  PT FREQUENCY: {rehab frequency:25116}  PT DURATION: {rehab duration:25117}  PLANNED INTERVENTIONS: Therapeutic exercises, Therapeutic activity, Neuromuscular re-education, Balance training, Gait training, Patient/Family education, Self Care, Joint mobilization, Joint manipulation, Stair training, Vestibular training, Canalith repositioning, Orthotic/Fit training, Prosthetic training, DME instructions,  Aquatic Therapy, Dry Needling, Electrical stimulation, Spinal manipulation, Spinal mobilization, Cryotherapy, Moist heat, Taping, Traction, Ultrasound, Ionotophoresis 4mg /ml Dexamethasone, Manual therapy, and Re-evaluation.   PLAN FOR NEXT SESSION: ***   8:24 AM, 08/13/23 Tereasa Coop, DPT Physical Therapy with [facility]

## 2023-08-15 ENCOUNTER — Ambulatory Visit: Payer: BC Managed Care – PPO | Admitting: Physical Therapy

## 2023-08-17 ENCOUNTER — Other Ambulatory Visit: Payer: Self-pay | Admitting: Dermatology

## 2023-08-20 ENCOUNTER — Ambulatory Visit (INDEPENDENT_AMBULATORY_CARE_PROVIDER_SITE_OTHER): Payer: BC Managed Care – PPO | Admitting: Physician Assistant

## 2023-08-20 ENCOUNTER — Encounter: Payer: Self-pay | Admitting: Physician Assistant

## 2023-08-20 VITALS — BP 136/80 | HR 85 | Temp 98.0°F | Ht 63.0 in | Wt 159.5 lb

## 2023-08-20 DIAGNOSIS — E785 Hyperlipidemia, unspecified: Secondary | ICD-10-CM | POA: Diagnosis not present

## 2023-08-20 DIAGNOSIS — Z23 Encounter for immunization: Secondary | ICD-10-CM | POA: Diagnosis not present

## 2023-08-20 DIAGNOSIS — E663 Overweight: Secondary | ICD-10-CM

## 2023-08-20 DIAGNOSIS — Z114 Encounter for screening for human immunodeficiency virus [HIV]: Secondary | ICD-10-CM

## 2023-08-20 DIAGNOSIS — Z0000 Encounter for general adult medical examination without abnormal findings: Secondary | ICD-10-CM

## 2023-08-20 DIAGNOSIS — Z1159 Encounter for screening for other viral diseases: Secondary | ICD-10-CM | POA: Diagnosis not present

## 2023-08-20 LAB — CBC WITH DIFFERENTIAL/PLATELET
Basophils Absolute: 0 10*3/uL (ref 0.0–0.1)
Basophils Relative: 0.7 % (ref 0.0–3.0)
Eosinophils Absolute: 0.2 10*3/uL (ref 0.0–0.7)
Eosinophils Relative: 3.5 % (ref 0.0–5.0)
HCT: 42.4 % (ref 36.0–46.0)
Hemoglobin: 14.5 g/dL (ref 12.0–15.0)
Lymphocytes Relative: 28.4 % (ref 12.0–46.0)
Lymphs Abs: 1.9 10*3/uL (ref 0.7–4.0)
MCHC: 34.3 g/dL (ref 30.0–36.0)
MCV: 90 fL (ref 78.0–100.0)
Monocytes Absolute: 0.9 10*3/uL (ref 0.1–1.0)
Monocytes Relative: 12.9 % — ABNORMAL HIGH (ref 3.0–12.0)
Neutro Abs: 3.7 10*3/uL (ref 1.4–7.7)
Neutrophils Relative %: 54.5 % (ref 43.0–77.0)
Platelets: 376 10*3/uL (ref 150.0–400.0)
RBC: 4.71 Mil/uL (ref 3.87–5.11)
RDW: 12.9 % (ref 11.5–15.5)
WBC: 6.7 10*3/uL (ref 4.0–10.5)

## 2023-08-20 LAB — COMPREHENSIVE METABOLIC PANEL
ALT: 26 U/L (ref 0–35)
AST: 27 U/L (ref 0–37)
Albumin: 4 g/dL (ref 3.5–5.2)
Alkaline Phosphatase: 44 U/L (ref 39–117)
BUN: 10 mg/dL (ref 6–23)
CO2: 26 meq/L (ref 19–32)
Calcium: 8.9 mg/dL (ref 8.4–10.5)
Chloride: 104 meq/L (ref 96–112)
Creatinine, Ser: 0.64 mg/dL (ref 0.40–1.20)
GFR: 94.15 mL/min (ref >60.00)
Glucose, Bld: 106 mg/dL — ABNORMAL HIGH (ref 70–99)
Potassium: 3.9 meq/L (ref 3.5–5.1)
Sodium: 136 meq/L (ref 135–145)
Total Bilirubin: 0.4 mg/dL (ref 0.2–1.2)
Total Protein: 6.9 g/dL (ref 6.0–8.3)

## 2023-08-20 LAB — LIPID PANEL
Cholesterol: 223 mg/dL — ABNORMAL HIGH (ref 0–200)
HDL: 50.3 mg/dL (ref >39.00)
Total CHOL/HDL Ratio: 4
Triglycerides: 447 mg/dL — ABNORMAL HIGH (ref 0.0–149.0)

## 2023-08-20 LAB — LDL CHOLESTEROL, DIRECT: Direct LDL: 140 mg/dL

## 2023-08-20 NOTE — Patient Instructions (Signed)
It was great to see you!  Please schedule an appointment with Elon Jester with physical therapy here at our office  Please go to the lab for blood work.   Our office will call you with your results unless you have chosen to receive results via MyChart.  If your blood work is normal we will follow-up each year for physicals and as scheduled for chronic medical problems.  If anything is abnormal we will treat accordingly and get you in for a follow-up.  Take care,  Lelon Mast

## 2023-08-20 NOTE — Progress Notes (Signed)
Lindsey Knight is a 62 y.o. female and is here for a comprehensive physical exam.  HPI Health Maintenance Due  Topic Date Due   HIV Screening  Never done   Hepatitis C Screening  Never done    Acute Concerns: None  Chronic Issues: None  Health Maintenance: Immunizations -- flu shot completed today Colonoscopy -- Last done 01/23/22. Found small internal hemorrhoids, otherwise normal. Repeat 2028.  Mammogram -- Last done 08/09/23. Results were normal PAP -- Last done 03/16/22. Results were normal Bone Density -- Last done 03/22/22.  T-score -4.1 of spine indicates osteoporosis.  Continues taking Fosamax. Diet -- Healthy overall Exercise -- Regular exercise  Sleep habits -- Good sleep quality: halted THC, reducing melatonin, planning to see if she can sleep without assistance.   Mood -- Stable  UTD with dentist? - Yes UTD with eye doctor? - Yes: cataracts found at most recent exam.  Weight history: Wt Readings from Last 10 Encounters:  08/20/23 159 lb 8 oz (72.3 kg)  08/09/23 161 lb (73 kg)  08/07/23 162 lb 3.2 oz (73.6 kg)  07/12/23 163 lb (73.9 kg)  06/20/23 165 lb (74.8 kg)  05/18/23 161 lb 6.4 oz (73.2 kg)  05/03/23 162 lb (73.5 kg)  04/09/23 163 lb (73.9 kg)  02/26/23 168 lb 3.2 oz (76.3 kg)  01/03/23 166 lb (75.3 kg)   Body mass index is 28.25 kg/m. No LMP recorded. Patient is postmenopausal.  Alcohol use:  reports current alcohol use of about 2.0 standard drinks of alcohol per week.  Tobacco use:  Tobacco Use: Low Risk  (08/20/2023)   Patient History    Smoking Tobacco Use: Never    Smokeless Tobacco Use: Never    Passive Exposure: Never   Eligible for lung cancer screening? deferred     08/20/2023    9:48 AM  Depression screen PHQ 2/9  Decreased Interest 0  Down, Depressed, Hopeless 0  PHQ - 2 Score 0  Altered sleeping 2  Tired, decreased energy 2  Change in appetite 0  Feeling bad or failure about yourself  0  Trouble concentrating 1  Moving slowly or  fidgety/restless 0  Suicidal thoughts 0  PHQ-9 Score 5  Difficult doing work/chores Somewhat difficult     Other providers/specialists: Patient Care Team: Jarold Motto, [place] as PCP - General (Physician Assistant) Tanda Rockers, NP as Nurse Practitioner (Radiology)    PMHx, SurgHx, SocialHx, Medications, and Allergies were reviewed in the Visit Navigator and updated as appropriate.   Past Medical History:  Diagnosis Date   Allergy Child   Anemia    Arthritis    Asthma    GERD (gastroesophageal reflux disease) 10/22   Hiatal hernia    Osteoporosis    Ulcer 6/23   Vaginal delivery    x 2, 1985, 1990     Past Surgical History:  Procedure Laterality Date   COLONOSCOPY  01/23/2022   normal - 2012   LIPOMA EXCISION Right 03/27/2023   back   NASAL SINUS SURGERY Bilateral 2015   OTHER SURGICAL HISTORY     lipoma removed from back 03/2023     Family History  Problem Relation Age of Onset   Colon cancer Mother    Cancer Mother    Hearing loss Mother    Heart failure Father    Asthma Father    COPD Father    Hearing loss Father    Crohn's disease Sister    Pancreatic cancer Other  Ovarian cancer Other    Uterine cancer Other     Social History   Tobacco Use   Smoking status: Never    Passive exposure: Never   Smokeless tobacco: Never  Vaping Use   Vaping status: Never Used  Substance Use Topics   Alcohol use: Yes    Alcohol/week: 2.0 standard drinks of alcohol    Types: 2 Standard drinks or equivalent per week   Drug use: Not Currently    Types: Marijuana    Comment: THC for sleep    Review of Systems:   Review of Systems  Constitutional:  Negative for chills, fever, malaise/fatigue and weight loss.  HENT:  Negative for hearing loss, sinus pain and sore throat.   Respiratory:  Negative for cough and hemoptysis.   Cardiovascular:  Negative for chest pain, palpitations, leg swelling and PND.  Gastrointestinal:  Negative for abdominal pain,  constipation, diarrhea, heartburn, nausea and vomiting.  Genitourinary:  Negative for dysuria, frequency and urgency.  Musculoskeletal:  Negative for back pain, myalgias and neck pain.  Skin:  Negative for itching and rash.  Neurological:  Negative for dizziness, tingling, seizures and headaches.  Endo/Heme/Allergies:  Negative for polydipsia.  Psychiatric/Behavioral:  Negative for depression. The patient is not nervous/anxious.        Objective:   BP 136/80 (BP Location: Left Arm, Patient Position: Sitting, Cuff Size: Normal)   Pulse 85   Temp 98 F (36.7 C) (Temporal)   Ht 5\' 3"  (1.6 m)   Wt 159 lb 8 oz (72.3 kg)   SpO2 97%   BMI 28.25 kg/m  Body mass index is 28.25 kg/m.   General Appearance:    Alert, cooperative, no distress, appears stated age  Head:    Normocephalic, without obvious abnormality, atraumatic  Eyes:    PERRL, conjunctiva/corneas clear, EOM's intact, fundi    benign, both eyes  Ears:    Normal TM's and external ear canals, both ears  Nose:   Nares normal, septum midline, mucosa normal, no drainage    or sinus tenderness  Throat:   Lips, mucosa, and tongue normal; teeth and gums normal  Neck:   Supple, symmetrical, trachea midline, no adenopathy;    thyroid:  no enlargement/tenderness/nodules; no carotid   bruit or JVD  Back:     Symmetric, no curvature, ROM normal, no CVA tenderness  Lungs:     Clear to auscultation bilaterally, respirations unlabored  Chest Wall:    No tenderness or deformity   Heart:    Regular rate and rhythm, S1 and S2 normal, no murmur, rub or gallop  Breast Exam:    Deferred  Abdomen:     Soft, non-tender, bowel sounds active all four quadrants,    no masses, no organomegaly  Genitalia:    Deferred  Extremities:   Extremities normal, atraumatic, no cyanosis or edema  Pulses:   2+ and symmetric all extremities  Skin:   Skin color, texture, turgor normal, no rashes or lesions  Lymph nodes:   Cervical, supraclavicular, and  axillary nodes normal  Neurologic:   CNII-XII intact, normal strength, sensation and reflexes    throughout    Assessment/Plan:   Routine physical examination Today patient counseled on age appropriate routine health concerns for screening and prevention, each reviewed and up to date or declined. Immunizations reviewed and up to date or declined. Labs ordered and reviewed. Risk factors for depression reviewed and negative. Hearing function and visual acuity are intact. ADLs screened and addressed  as needed. Functional ability and level of safety reviewed and appropriate. Education, counseling and referrals performed based on assessed risks today. Patient provided with a copy of personalized plan for preventive services.  Screening for HIV (human immunodeficiency virus) Update HIV  Encounter for screening for other viral diseases Update hepatitis C  Hyperlipidemia, unspecified hyperlipidemia type Update lipid panel and make recommendations accordingly  Need for immunization against influenza Updated flu shot today  Overweight Continue healthy lifestyle as able  I,Emily Lagle,acting as a scribe for [employer], PA.,have documented all relevant documentation on the behalf of Jarold Motto, PA,as directed by  Jarold Motto, PA while in the presence of Jarold Motto, [place].  I, Jarold Motto, [place], have reviewed all documentation for this visit. The documentation on 08/20/23 for the exam, diagnosis, procedures, and orders are all accurate and complete.  Jarold Motto, PA-C [facility]

## 2023-08-20 NOTE — Progress Notes (Unsigned)
New Patient Note  RE: Racquell Freeburn MRN: [id number] DOB: [birth year] Date of Office Visit: 08/21/2023  Consult requested by: Ashok Croon, MD Primary care provider: Jarold Motto, PA  Chief Complaint: No chief complaint on file.  History of Present Illness: I had the pleasure of seeing Lindsey Knight for initial evaluation at the [facility] on 08/20/2023. She is a 62 y.o. female, who is referred here by Jarold Motto, PA for the evaluation of nasal polyps, asthma.  Discussed the use of AI scribe software for clinical note transcription with the patient, who gave verbal consent to proceed.  History of Present Illness             08/07/2023 ENT visit: "Chronic sinusitis with nasal polyps + septal deviation and inferior turbinate hypertrophy - s/p FESS several years ago, with sx recurrence and evidence of severe inflammation bilateral maxillary and ethmoid sinuses -Initially had MRI brain which showed chronic sinus disease and potential irregularity at cribriform plat, but max face CT done following MRI brain did not reveal any bony defects in the skull base including area of concern along the cribriform plate -I discussed findings of both imaging studies with the patient -Based on my evaluation today including nasal endoscopy she has evidence of polyp regrowth and chronic nasal congestion along with septal deviation inferior turban hypertrophy and purulent secretions along the left middle meatus consistent with chronic rhinosinusitis with nasal polyps Plan  - she had recent CBC with diff, will order total IgE - CT sinuses brainlab protocol - CT max face with fewer images - need for image guidance during surgery  - nasal steroid rinses with [facility] nasal saline/mometasone to shrink the polyps and help with sx post-op -  Doxycycline 100 mg BID for sinus infection and Medrol Dose pack (4 mg tab pack, take as directed)  - Allergy referral for consideration to  receive biologic such as Dupixent vs Xolair (hx of asthma) - I discussed management of CRSwNP and importance of surgical options and medical management of allergies and chronic inflammation - we discussed risks and benefits of surgery and she would like to proceed - will schedule for revision sinus surgery (bilateral revision FESS, maxillary antrostomy b/l, vs balloon sinuplasty, bilateral total ethmoidectomy, bilateral nasal polypectomy, possible inferior turbinate reduction, possible septoplasty) -She reports history of normal IgA when she had her [place], she inquired about the significance of the test result, I advised her to have immunoglobulin levels checked with an allergist, who can better discussed the significance of it if her levels are abnormal   2. Environmental allergies and hx of asthma -Continue Zyrtec 10 mg daily -Continue Atrovent nasal spray -Stop Flonase after initiation of nasal steroid rinses -Continue Singulair   3. Asthma  -Continue albuterol as needed -Continue Singulair 10 mg daily -See pulmonary as needed established with Melody Comas, MD"  Assessment and Plan: Lindsey Knight is a 62 y.o. female with: ***  Assessment and Plan               No follow-ups on file.  No orders of the defined types were placed in this encounter.  Lab Orders  No laboratory test(s) ordered today    Other allergy screening: Asthma: {Blank single:19197::"yes","no"} Rhino conjunctivitis: {Blank single:19197::"yes","no"} Food allergy: {Blank single:19197::"yes","no"} Medication allergy: {Blank single:19197::"yes","no"} Hymenoptera allergy: {Blank single:19197::"yes","no"} Urticaria: {Blank single:19197::"yes","no"} Eczema:{Blank single:19197::"yes","no"} History of recurrent infections suggestive of immunodeficency: {Blank single:19197::"yes","no"}  Diagnostics: Spirometry:  Tracings reviewed. Her effort: {Blank  single:19197::"Good reproducible efforts.","It was hard to get consistent efforts and there is a question as to whether this reflects a maximal maneuver.","Poor effort, data can not be interpreted."} FVC: ***L FEV1: ***L, ***% predicted FEV1/FVC ratio: ***% Interpretation: {Blank single:19197::"Spirometry consistent with mild obstructive disease","Spirometry consistent with moderate obstructive disease","Spirometry consistent with severe obstructive disease","Spirometry consistent with possible restrictive disease","Spirometry consistent with mixed obstructive and restrictive disease","Spirometry uninterpretable due to technique","Spirometry consistent with normal pattern","No overt abnormalities noted given today's efforts"}.  Please see scanned spirometry results for details.  Skin Testing: {Blank single:19197::"Select foods","Environmental allergy panel","Environmental allergy panel and select foods","Food allergy panel","None","Deferred due to recent antihistamines use"}. *** Results discussed with patient/family.   Past Medical History: Patient Active Problem List   Diagnosis Date Noted   Trigger thumb, right thumb 07/13/2022   Colon cancer (HCC) 01/03/2022   Crohn's disease (HCC) 12/30/2021   Past Medical History:  Diagnosis Date   Allergy Child   Anemia    Arthritis    Asthma    GERD (gastroesophageal reflux disease) 10/22   Hiatal hernia    Osteoporosis    Ulcer 6/23   Vaginal delivery    x 2, 1985, 1990   Past Surgical History: Past Surgical History:  Procedure Laterality Date   COLONOSCOPY  01/23/2022   normal - 2012   LIPOMA EXCISION Right 03/27/2023   back   NASAL SINUS SURGERY Bilateral 2015   OTHER SURGICAL HISTORY     lipoma removed from back 03/2023   Medication List:  Current Outpatient Medications  Medication Sig Dispense Refill   albuterol (VENTOLIN HFA) 108 (90 Base) MCG/ACT inhaler Inhale 1 puff into the lungs every 6 (six) hours as needed.      alendronate (FOSAMAX) 70 MG tablet Take 1 tablet (70 mg total) by mouth every 7 (seven) days. Take with a full glass of water on an empty stomach. 12 tablet 4   Ascorbic Acid (VITAMIN C CR) 500 MG TBCR Take 1 tablet by mouth every other day.     benzonatate (TESSALON) 100 MG capsule Take 1 capsule (100 mg total) by mouth 3 (three) times daily as needed. 30 capsule 1   Cetirizine HCl (ZYRTEC PO) Take by mouth.     Cholecalciferol (VITAMIN D3 ADULT GUMMIES PO)      Cyanocobalamin (VITAMIN B-12 PO) Take 1,000 mcg by mouth daily in the afternoon.     doxycycline (VIBRA-TABS) 100 MG tablet Take 1 tablet (100 mg total) by mouth 2 (two) times daily for 20 days. 20 tablet 0   estradiol (ESTRACE VAGINAL) 0.1 MG/GM vaginal cream Place 1 g vaginally 3 (three) times a week. 42.5 g 12   famotidine (PEPCID) 20 MG tablet Take 20 mg by mouth 2 (two) times daily.     ferrous sulfate 324 MG TBEC Take 324 mg by mouth every other day.     fluticasone-salmeterol (ADVAIR DISKUS) 250-50 MCG/ACT AEPB Inhale 1 puff into the lungs in the morning and at bedtime. 60 each 6   ipratropium (ATROVENT) 0.03 % nasal spray Place 2 sprays into both nostrils every 12 (twelve) hours. 30 mL 12   MAGNESIUM GLYCINATE PO      Melatonin 5 MG CHEW Chew by mouth.     methylPREDNISolone (MEDROL DOSEPAK) 4 MG TBPK tablet Take with signs of chronic sinusitis and take as directed 1 each 1   montelukast (SINGULAIR) 10 MG tablet TAKE 1 TABLET BY MOUTH EVERY NIGHT AT BEDTIME 90 tablet 3  Multiple Vitamins-Minerals (MULTIVITAMIN ADULT) CHEW      promethazine-dextromethorphan (PROMETHAZINE-DM) 6.25-15 MG/5ML syrup Take 5 mLs by mouth 3 (three) times daily as needed for cough. 180 mL 0   traZODone (DESYREL) 100 MG tablet Take 1 tablet (100 mg total) by mouth at bedtime. 90 tablet 3   No current facility-administered medications for this visit.   Allergies: Allergies  Allergen Reactions   Tape Rash   Social History: Social History    Socioeconomic History   Marital status: Married    Spouse name: Not on file   Number of children: Not on file   Years of education: Not on file   Highest education level: Bachelor's degree (e.g., BA, AB, BS)  Occupational History   Not on file  Tobacco Use   Smoking status: Never    Passive exposure: Never   Smokeless tobacco: Never  Vaping Use   Vaping status: Never Used  Substance and Sexual Activity   Alcohol use: Yes    Alcohol/week: 2.0 standard drinks of alcohol    Types: 2 Standard drinks or equivalent per week   Drug use: Not Currently    Types: Marijuana    Comment: THC for sleep   Sexual activity: Not Currently    Partners: Male    Birth control/protection: Post-menopausal    Comment: menarche 62yo, sexual debut 62yo  Other Topics Concern   Not on file  Social History Narrative   Retired Engineer, civil (consulting)   Married   Has two local daughters and grandchildren   Social Determinants of Health   Financial Resource Strain: Low Risk  (06/19/2023)   Overall Financial Resource Strain (CARDIA)    Difficulty of Paying Living Expenses: Not hard at all  Food Insecurity: No Food Insecurity (06/19/2023)   Hunger Vital Sign    Worried About Running Out of Food in the Last Year: Never true    Ran Out of Food in the Last Year: Never true  Transportation Needs: No Transportation Needs (06/19/2023)   PRAPARE - Administrator, Civil Service (Medical): No    Lack of Transportation (Non-Medical): No  Physical Activity: Sufficiently Active (06/19/2023)   Exercise Vital Sign    Days of Exercise per Week: 4 days    Minutes of Exercise per Session: 60 min  Stress: Stress Concern Present (06/19/2023)   [employer] of Occupational Health - Occupational Stress Questionnaire    Feeling of Stress : To some extent  Social Connections: Moderately Integrated (06/19/2023)   Social Connection and Isolation Panel [NHANES]    Frequency of Communication with Friends and Family: More than  three times a week    Frequency of Social Gatherings with Friends and Family: Twice a week    Attends Religious Services: Never    Database administrator or Organizations: Yes    Attends Engineer, structural: More than 4 times per year    Marital Status: Married   Lives in a ***. Smoking: *** Occupation: ***  Environmental HistorySurveyor, minerals in the house: Copywriter, advertising in the family room: {Blank single:19197::"yes","no"} Carpet in the bedroom: {Blank single:19197::"yes","no"} Heating: {Blank single:19197::"electric","gas","heat pump"} Cooling: {Blank single:19197::"central","window","heat pump"} Pet: {Blank single:19197::"yes ***","no"}  Family History: Family History  Problem Relation Age of Onset   Colon cancer Mother    Cancer Mother    Hearing loss Mother    Heart failure Father    Asthma Father    COPD Father    Hearing loss Father    Crohn's  disease Sister    Pancreatic cancer Other    Ovarian cancer Other    Uterine cancer Other    Problem                               Relation Asthma                                   *** Eczema                                *** Food allergy                          *** Allergic rhino conjunctivitis     ***  Review of Systems  Constitutional:  Negative for appetite change, chills, fever and unexpected weight change.  HENT:  Negative for congestion and rhinorrhea.   Eyes:  Negative for itching.  Respiratory:  Negative for cough, chest tightness, shortness of breath and wheezing.   Cardiovascular:  Negative for chest pain.  Gastrointestinal:  Negative for abdominal pain.  Genitourinary:  Negative for difficulty urinating.  Skin:  Negative for rash.  Neurological:  Negative for headaches.    Objective: There were no vitals taken for this visit. There is no height or weight on file to calculate BMI. Physical Exam Vitals and nursing note reviewed.  Constitutional:       Appearance: Normal appearance. She is well-developed.  HENT:     Head: Normocephalic and atraumatic.     Right Ear: Tympanic membrane and external ear normal.     Left Ear: Tympanic membrane and external ear normal.     Nose: Nose normal.     Mouth/Throat:     Mouth: Mucous membranes are moist.     Pharynx: Oropharynx is clear.  Eyes:     Conjunctiva/sclera: Conjunctivae normal.  Cardiovascular:     Rate and Rhythm: Normal rate and regular rhythm.     Heart sounds: Normal heart sounds. No murmur heard.    No friction rub. No gallop.  Pulmonary:     Effort: Pulmonary effort is normal.     Breath sounds: Normal breath sounds. No wheezing, rhonchi or rales.  Musculoskeletal:     Cervical back: Neck supple.  Skin:    General: Skin is warm.     Findings: No rash.  Neurological:     Mental Status: She is alert and oriented to person, place, and time.  Psychiatric:        Behavior: Behavior normal.    The plan was reviewed with the patient/family, and all questions/concerned were addressed.  It was my pleasure to see Niajah today and participate in her care. Please feel free to contact me with any questions or concerns.  Sincerely,  Wyline Mood, DO Allergy & Immunology  [facility] office: [phone] [facility] office: [phone]

## 2023-08-21 ENCOUNTER — Encounter: Payer: Self-pay | Admitting: Allergy

## 2023-08-21 ENCOUNTER — Encounter: Payer: Self-pay | Admitting: Physician Assistant

## 2023-08-21 ENCOUNTER — Ambulatory Visit: Payer: BC Managed Care – PPO | Admitting: Allergy

## 2023-08-21 VITALS — BP 122/74 | HR 87 | Temp 97.9°F | Resp 18 | Ht 63.0 in | Wt 160.5 lb

## 2023-08-21 DIAGNOSIS — J339 Nasal polyp, unspecified: Secondary | ICD-10-CM

## 2023-08-21 DIAGNOSIS — J3089 Other allergic rhinitis: Secondary | ICD-10-CM | POA: Diagnosis not present

## 2023-08-21 DIAGNOSIS — J4540 Moderate persistent asthma, uncomplicated: Secondary | ICD-10-CM

## 2023-08-21 DIAGNOSIS — J329 Chronic sinusitis, unspecified: Secondary | ICD-10-CM | POA: Diagnosis not present

## 2023-08-21 LAB — HIV ANTIBODY (ROUTINE TESTING W REFLEX): HIV 1&2 Ab, 4th Generation: NONREACTIVE

## 2023-08-21 LAB — HEPATITIS C ANTIBODY: Hepatitis C Ab: NONREACTIVE

## 2023-08-21 NOTE — Patient Instructions (Addendum)
Polyps/rhinitis Follow up with ENT as scheduled. Recommend Dupixent injections - handout given. Continue with budesonide saline wash. Continue Singulair (montelukast) 10mg  daily at night. Recommend environmental allergy testing.  Will make additional recommendations based on results.   Asthma Keep follow up with pulmonology.  Daily controller medication(s): continue Advair 1 puff twice a day and rinse mouth after each use.  May use albuterol rescue inhaler 2 puffs every 4 to 6 hours as needed for shortness of breath, chest tightness, coughing, and wheezing.  Monitor frequency of use - if you need to use it more than twice per week on a consistent basis let us know.  Breathing control goals:  Full participation in all desired activities (may need albuterol before activity) Albuterol use two times or less a week on average (not counting use with activity) Cough interfering with sleep two times or less a month Oral steroids no more than once a year No hospitalizations   Infections Keep track of infections and antibiotics use. If persistent will get bloodwork next to look at immune system.   Follow up for allergy skin testing next week.  Follow up with me in 2 months or sooner if needed.

## 2023-08-21 NOTE — Therapy (Unsigned)
OUTPATIENT PHYSICAL THERAPY LOWER EXTREMITY EVALUATION   Patient Name: Lindsey Knight MRN: [id number] DOB:09-13-1961, 62 y.o., female Today's Date: 08/22/2023  END OF SESSION:  PT End of Session - 08/22/23 0803     Visit Number 1    Number of Visits 12    Date for PT Re-Evaluation 11/14/23    Authorization Type $120 copay and VL 30 with 29 left    Authorization - Visit Number 1    Authorization - Number of Visits 29    PT Start Time 0804    PT Stop Time 0842    PT Time Calculation (min) 38 min    Activity Tolerance Patient tolerated treatment well    Behavior During Therapy [facility] for tasks assessed/performed             Past Medical History:  Diagnosis Date   Allergy Child   Anemia    Arthritis    Asthma    Eczema    GERD (gastroesophageal reflux disease) 10/22   Hiatal hernia    Osteoporosis    Ulcer 6/23   Vaginal delivery    x 2, 1985, 1990   Past Surgical History:  Procedure Laterality Date   COLONOSCOPY  01/23/2022   normal - 2012   LIPOMA EXCISION Right 03/27/2023   back   NASAL SINUS SURGERY Bilateral 2015   OTHER SURGICAL HISTORY     lipoma removed from back 03/2023   Patient Active Problem List   Diagnosis Date Noted   Trigger thumb, right thumb 07/13/2022   Colon cancer (HCC) 01/03/2022   Crohn's disease (HCC) 12/30/2021    PCP: Jarold Motto, PA  REFERRING PROVIDER: Rodolph Bong, MD   REFERRING DIAG: M77.51 (ICD-10-CM) - Tendinitis of right ankle  THERAPY DIAG:  Pain in right ankle and joints of right foot  Muscle weakness (generalized)  Difficulty in walking, not elsewhere classified  Rationale for Evaluation and Treatment: Rehabilitation  ONSET DATE: July 2024- no known MOI  SUBJECTIVE:   SUBJECTIVE STATEMENT: States she had a lot of right ankle pain and she doesn't know what she did. States she has a partial tear per MD and she tried a compression brace and nitroglycerin patches. States she had a rash and stopped using the  patches. States she has been hesitant about walking as she has pain afterwards but not during.  Reports she has also had difficulty wearing shoes as the pain is right where her shoes touch and has been wearing sandals a lot.  Has been having a lot of difficulty sleeping at night as she tosses and turns and when she does this her ankle moves in a way that wakes her up.  Reports she is on 3 rounds of steroids due to an ENT issue and does not know if this is some due to her helped her symptoms at all.  Reports overall the swelling is better but continues to have swelling and pain along the outside of her ankle.    PERTINENT HISTORY: N/A PAIN:  Are you having pain? Yes: NPRS scale: 4/10 Pain location: right lateral ankle Pain description: sharp Aggravating factors: shoes, turn, sleep tossing and turning Relieving factors: rest  PRECAUTIONS: None  RED FLAGS: None   WEIGHT BEARING RESTRICTIONS: No  FALLS:  Has patient fallen in last 6 months? Yes. Number of falls 2- muddy yard - rushing in garden    OCCUPATION: retired Engineer, civil (consulting)   PLOF: Independent likes to walk   PATIENT GOALS: to be able to  walk and sleep better    OBJECTIVE:   DIAGNOSTIC FINDINGS: right ankle xray 08/09/23 awaiting results  PATIENT SURVEYS:  FOTO 60%   COGNITION: Overall cognitive status: Within functional limits for tasks assessed     SENSATION: Not tested  EDEMA:  Circumferential:    At ankle L 24.0cm, R 24.75cm Figure 8: 49.5cm L, 51.0cm R  Mid calf circumferential R 39.0 cm L 38.0  POSTURE: rounded shoulders, forward head, and posterior pelvic tilt  PALPATION: Tenderness to palpation along right lateral ankle and medial and lateral lower leg on right side.  Swelling noted throughout right lower leg and tightness in this area    LE Measurements Lower Extremity Right EVAL Left EVAL   A/PROM MMT A/PROM MMT  Hip Flexion      Hip Extension      Hip Abduction      Hip Adduction      Hip  Internal rotation      Hip External rotation      Knee Flexion      Knee Extension      Ankle Dorsiflexion knee bent/straight 5/10 4** 10/10 5  Ankle Plantarflexion 45 Unable painful 55 20 SL heel raises  Ankle Inversion 30 (rests in 20) 3+* 30 (rests in 10) 4+  Ankle Eversion 0 3+ 0 4+   (Blank rows = not tested) * pain **favors extensor digitorum     GAIT: Distance walked: 25 feet within clinic Assistive device utilized: None Level of assistance: Complete Independence Comments: Reduced weightbearing on right lower extremity in stance phase, ankle and excessive inversion right foot reduced toe off   TODAY'S TREATMENT:                                                                                                                              DATE:   08/22/2023 Reviewed Thera-Band exercises MD provided patient Therapeutic Exercise:  Aerobic: Supine: Prone:  Seated: Right ankle eversion stretch 3 minutes  Standing: Neuromuscular Re-education: Manual Therapy: Therapeutic Activity: Self Care: Trigger Point Dry Needling:  Modalities:    PATIENT EDUCATION:  Education details: on current presentation, on HEP, on clinical outcomes score and POC, on swelling and swelling management as well as use of compression garments during the day and night secondary to continued pain and swelling Person educated: Patient Education method: Explanation, Demonstration, and Handouts Education comprehension: verbalized understanding   HOME EXERCISE PROGRAM: [id number]  ASSESSMENT:  CLINICAL IMPRESSION: Patient presents to physical therapy with complaints of right ankle pain that started about 6 months ago and has improved but continues to bother her with functional activity and sleeping at night.  Patient presents with weakness, range of motion limitations as well as continued swelling along right ankle despite repetitive use of steroids over the last few months due to an ENT issue.  Educated  patient current presentation as well as plan moving forward.  Focused on managing swelling in right lower extremity secondary to  this likely causing increased pain at night.  Answered all questions and patient would greatly benefit from skilled PT to improve overall function and quality of life.  OBJECTIVE IMPAIRMENTS: decreased activity tolerance, difficulty walking, decreased ROM, decreased strength, improper body mechanics, and pain.   ACTIVITY LIMITATIONS: sleeping, stairs, and locomotion level  PARTICIPATION LIMITATIONS: community activity  PERSONAL FACTORS: Time since onset of injury/illness/exacerbation are also affecting patient's functional outcome.   REHAB POTENTIAL: Good  CLINICAL DECISION MAKING: Stable/uncomplicated  EVALUATION COMPLEXITY: Low   GOALS: Goals reviewed with patient? yes  SHORT TERM GOALS: Target date: 09/19/2023   Patient will be independent in self management strategies to improve quality of life and functional outcomes. Baseline: New Program Goal status: INITIAL  2.  Patient will report at least 50% improvement in overall symptoms and/or function to demonstrate improved functional mobility Baseline: 0% better Goal status: INITIAL  3.  Patient will be able to sleep through the night and not wake up due to right ankle pain Baseline: Waking up due to pain Goal status: INITIAL     LONG TERM GOALS: Target date: 11/14/23  Patient will report at least 75% improvement in overall symptoms and/or function to demonstrate improved functional mobility Baseline: 0% better Goal status: INITIAL  2.  Patient will improve score on FOTO outcomes measure to projected score to demonstrate overall improved function and QOL Baseline: see above Goal status: INITIAL  3.  Patient will demonstrate within 0.5 cm of swelling difference between right and left lower leg and ankle. Baseline: See above Goal status: INITIAL  4.  Patient will be able to confidently walk  for 15 to 20 minutes without pain following to improve mobility and community. Baseline: Unable painful afterwards Goal status: INITIAL    PLAN:  PT FREQUENCY: 1-2x/week For total of 12 visits over 12 weeks certification period  PT DURATION: 12 weeks  PLANNED INTERVENTIONS: Therapeutic exercises, Therapeutic activity, Neuromuscular re-education, Balance training, Gait training, Patient/Family education, Self Care, Joint mobilization, Joint manipulation, Stair training, Vestibular training, Canalith repositioning, Orthotic/Fit training, Prosthetic training, DME instructions, Aquatic Therapy, Dry Needling, Electrical stimulation, Spinal manipulation, Spinal mobilization, Cryotherapy, Moist heat, Taping, Traction, Ultrasound, Ionotophoresis 4mg /ml Dexamethasone, Manual therapy, and Re-evaluation.   PLAN FOR NEXT SESSION: Edema mobilization, follow-up with compression garments, lateral ankle strengthening progressed towards standing exercises once tolerated and balance to work on frontal plane instability and weakness   10:55 AM, 08/22/23 Tereasa Coop, DPT Physical Therapy with [facility]

## 2023-08-22 ENCOUNTER — Ambulatory Visit: Payer: BC Managed Care – PPO | Admitting: Physical Therapy

## 2023-08-22 ENCOUNTER — Encounter: Payer: Self-pay | Admitting: Physical Therapy

## 2023-08-22 ENCOUNTER — Inpatient Hospital Stay
Admission: RE | Admit: 2023-08-22 | Discharge: 2023-08-22 | Disposition: A | Discharge status: Home / Self Care | Payer: BC Managed Care – PPO | Source: Ambulatory Visit | Attending: Otolaryngology

## 2023-08-22 ENCOUNTER — Ambulatory Visit (HOSPITAL_COMMUNITY): Payer: BC Managed Care – PPO

## 2023-08-22 ENCOUNTER — Encounter (HOSPITAL_COMMUNITY): Payer: Self-pay

## 2023-08-22 DIAGNOSIS — M25571 Pain in right ankle and joints of right foot: Secondary | ICD-10-CM | POA: Diagnosis not present

## 2023-08-22 DIAGNOSIS — J339 Nasal polyp, unspecified: Secondary | ICD-10-CM

## 2023-08-22 DIAGNOSIS — J324 Chronic pansinusitis: Secondary | ICD-10-CM | POA: Diagnosis not present

## 2023-08-22 DIAGNOSIS — M6281 Muscle weakness (generalized): Secondary | ICD-10-CM | POA: Diagnosis not present

## 2023-08-22 DIAGNOSIS — R262 Difficulty in walking, not elsewhere classified: Secondary | ICD-10-CM | POA: Diagnosis not present

## 2023-08-22 DIAGNOSIS — J329 Chronic sinusitis, unspecified: Secondary | ICD-10-CM

## 2023-08-24 ENCOUNTER — Telehealth (INDEPENDENT_AMBULATORY_CARE_PROVIDER_SITE_OTHER): Payer: Self-pay

## 2023-08-26 ENCOUNTER — Encounter: Payer: Self-pay | Admitting: Family Medicine

## 2023-08-27 NOTE — Progress Notes (Signed)
Right ankle x-ray shows a small heel spur.

## 2023-08-27 NOTE — Telephone Encounter (Signed)
Sorry I haven't heard anything about this, I would check in with French Ana to see what's going on

## 2023-08-27 NOTE — Telephone Encounter (Signed)
Lindsey Knight if you want to touch base with this patient and let her know the above

## 2023-08-28 ENCOUNTER — Telehealth (INDEPENDENT_AMBULATORY_CARE_PROVIDER_SITE_OTHER): Payer: Self-pay

## 2023-08-28 NOTE — Telephone Encounter (Signed)
Spoke with patient as to why part of her surgery was denied.  I explained that her insurance denied it as not being medically necessary.  She understood and would talk more about the procedure at her pre-op appointment coming up

## 2023-08-30 ENCOUNTER — Telehealth (INDEPENDENT_AMBULATORY_CARE_PROVIDER_SITE_OTHER): Payer: Self-pay

## 2023-08-30 NOTE — Telephone Encounter (Signed)
Spoke with patient to let her know that Dr Irene Pap will need the CT CD prior to surgery. She stated she would contact [facility] in [place] where the scan was done and request a copy

## 2023-08-30 NOTE — Patient Instructions (Addendum)
Nasal polyps Follow up with ENT as scheduled. Recommend Dupixent injections -message sent to Tammy as she is interested. Continue with budesonide saline wash. Continue Singulair (montelukast) 10mg  daily at night. Environmental allergy testing today is: Positive to a couple weeds with adequate controls.  Intradermal testing is positive to major mold mix 2 Copy of skin test given Start avoidance measures as below  Moderate persistent asthma- managed by pulmonology Keep follow up with pulmonology.  Daily controller medication(s): continue Advair 1 puff twice a day and rinse mouth after each use.  May use albuterol rescue inhaler 2 puffs every 4 to 6 hours as needed for shortness of breath, chest tightness, coughing, and wheezing.  Monitor frequency of use - if you need to use it more than twice per week on a consistent basis let us know.  Breathing control goals:  Full participation in all desired activities (may need albuterol before activity) Albuterol use two times or less a week on average (not counting use with activity) Cough interfering with sleep two times or less a month Oral steroids no more than once a year No hospitalizations   Recurrent sinusitis Keep track of infections and antibiotics use. If persistent will get bloodwork next to look at immune system.     Follow up with Dr. Selena Batten in 2 months or sooner if needed.   Reducing Pollen Exposure The American Academy of All[employer], Asthma and Immunology suggests the following steps to reduce your exposure to pollen during allergy seasons. Do not hang sheets or clothing out to dry; pollen may collect on these items. Do not mow lawns or spend time around freshly cut grass; mowing stirs up pollen. Keep windows closed at night.  Keep car windows closed while driving. Minimize morning activities outdoors, a time when pollen counts are usually at their highest. Stay indoors as much as possible when pollen counts or humidity is  high and on windy days when pollen tends to remain in the air longer. Use air conditioning when possible.  Many air conditioners have filters that trap the pollen spores. Use a HEPA room air filter to remove pollen form the indoor air you breathe.  Control of Mold Allergen Mold and fungi can grow on a variety of surfaces provided certain temperature and moisture conditions exist.  Outdoor molds grow on plants, decaying vegetation and soil.  The major outdoor mold, Alternaria and Cladosporium, are found in very high numbers during hot and dry conditions.  Generally, a late Summer - Fall peak is seen for common outdoor fungal spores.  Rain will temporarily lower outdoor mold spore count, but counts rise rapidly when the rainy period ends.  The most important indoor molds are Aspergillus and Penicillium.  Dark, humid and poorly ventilated basements are ideal sites for mold growth.  The next most common sites of mold growth are the bathroom and the kitchen.  Outdoor [employer] Use air conditioning and keep windows closed Avoid exposure to decaying vegetation. Avoid leaf raking. Avoid grain handling. Consider wearing a face mask if working in moldy areas.  Indoor Mold Control Maintain humidity below 50%. Clean washable surfaces with 5% bleach solution. Remove sources e.g. Contaminated carpets.

## 2023-08-31 ENCOUNTER — Encounter: Payer: Self-pay | Admitting: Family

## 2023-08-31 ENCOUNTER — Ambulatory Visit: Payer: BC Managed Care – PPO | Admitting: Family

## 2023-08-31 ENCOUNTER — Other Ambulatory Visit: Payer: Self-pay

## 2023-08-31 VITALS — BP 124/60 | HR 81 | Temp 98.3°F | Resp 16 | Wt 159.8 lb

## 2023-08-31 DIAGNOSIS — J4540 Moderate persistent asthma, uncomplicated: Secondary | ICD-10-CM | POA: Diagnosis not present

## 2023-08-31 DIAGNOSIS — J3089 Other allergic rhinitis: Secondary | ICD-10-CM | POA: Diagnosis not present

## 2023-08-31 DIAGNOSIS — J339 Nasal polyp, unspecified: Secondary | ICD-10-CM

## 2023-08-31 DIAGNOSIS — J302 Other seasonal allergic rhinitis: Secondary | ICD-10-CM | POA: Diagnosis not present

## 2023-08-31 DIAGNOSIS — J329 Chronic sinusitis, unspecified: Secondary | ICD-10-CM

## 2023-08-31 NOTE — Progress Notes (Signed)
[address] [place] [address] Dept: [phone]  FOLLOW UP NOTE  Patient ID: Lindsey Knight, female    DOB: November 10, 1961  Age: 62 y.o. MRN: [id number] Date of Office Visit: 08/31/2023  Assessment  Chief Complaint: Allergy Testing  HPI Lindsey Knight is a 62 year old female who presents today for skin testing to environmental allergens.  She was last seen on August 21, 2023 by Dr. Selena Batten for allergic rhinitis/nasal polyps, recurrent sinusitis, and moderate persistent asthma without complication.  She denies any new diagnosis or surgery since her last office visit.  Allergic rhinitis/nasal polyps.  She reports that she has been off all antihistamines and Pepcid for the past 3 days.  She reports that since being off her antihistamine she woke up this morning itching all over, but without the rash. She is up to date on her mammogram and colonoscopy. She reports that she continues to have rhinorrhea, nasal congestion, and postnasal drip. For the most part what is coming out is clear in color. She feels like it is part of the saline rinse that she does with budesonide that is coming out.  She supposed to do her budesonide saline rinses as per ENT twice a day, but yesterday she was only able to do it once since she was traveling.  She continues to take Singulair 10 mg once a day and Zyrtec 10 mg once a day.  Since being off antihistamines for skin testing today she has also noticed that she has had a headache for which she had to take 1 Advil today.  She feels like the antihistamines have helped with the headaches.  She is able to taste and smell right now.She has been  able to taste and smell after last getting COVID-19. She has previously had sinus surgery around 2013.  She also has previously been on allergen immunotherapy in high school and college.    She does report that she recently had her CT come back that shows a lot of polyps.  Her CT sinus without contrast on 10/002/24 shows:  "CLINICAL DATA:   Chronic sinusitis. Nasal polyps. History of sinus surgery.   EXAM: CT STEALTH SINUS W/O CONTRAST   TECHNIQUE: Multidetector CT images of the paranasal sinuses were obtained using the standard protocol without intravenous contrast.   RADIATION DOSE REDUCTION: This exam was performed according to the departmental dose-optimization program which includes automated exposure control, adjustment of the mA and/or kV according to patient size and/or use of iterative reconstruction technique.   COMPARISON:  Maxillofacial CT 07/17/2023   FINDINGS: Paranasal sinuses:   Frontal: Persistent, near complete opacification bilaterally with associated chronic osteitis.   Ethmoid: Moderate residual air cell opacification bilaterally, predominantly involving anterior air cells and overall greatly improved from prior (particularly posteriorly).   Maxillary: Prominent circumferential, mildly nodular/polypoid mucosal thickening bilaterally with associated partial mineralization of sinus contents and with chronic osteitis. Improved aeration from prior.   Sphenoid: Minimal mucosal thickening and possible trace fluid bilaterally, improved from prior.   Right ostiomeatal unit: Patent maxillary antrostomy.   Left ostiomeatal unit: Patent maxillary antrostomy.   Nasal passages: Bilateral middle turbinate concha bullosa. Residual mucosal thickening primarily in the anterosuperior aspect of the nasal cavity, improved from prior. Essentially midline nasal septum.   Anatomy: No pneumatization superior to anterior ethmoid notches. Keros II with the left fovea ethmoidalis again noted to be higher than on the right. Sellar sphenoid pneumatization pattern. No dehiscence of carotid or optic canals. No onodi cell.   Other:  Clear mastoid air cells and middle ear cavities. Unremarkable appearance of the orbits and included portion of the brain.   IMPRESSION: Overall improved pansinusitis although with  persistent near complete opacification of the frontal sinuses.     Electronically Signed   By: Sebastian Ache M.D.   On: 08/30/2023 16:39 "  Recurrent sinusitis: She reports that she has not had any infections or antibiotics since her last office visit.  Moderate persistent asthma without complication: This is managed by her pulmonologist. She has Advair 250/50 mcg 1 puff twice a day to use and albuterol as needed.  She reports a little bit of a dry cough and wheezing since coming off her antihistamine.  She denies tightness in chest, shortness of breath, and nocturnal awakenings due to breathing problems.  Since her last office visit she has not required any systemic steroids or made any trips to the emergency room or urgent care due to breathing problems.  She reports that the only time she uses her albuterol inhaler is when she is sick.     Drug Allergies:  Allergies  Allergen Reactions   Other Hives    Adhesives   Tape Rash    Review of Systems: Negative except as per HPI   Physical Exam: BP 124/60   Pulse 81   Temp 98.3 F (36.8 C) (Temporal)   Resp 16   Wt 159 lb 12.8 oz (72.5 kg)   BMI 28.31 kg/m    Physical Exam Constitutional:      Appearance: Normal appearance.  HENT:     Head: Normocephalic and atraumatic.     Comments: Pharynx normal, eyes normal, ears normal, nose normal    Right Ear: Tympanic membrane, ear canal and external ear normal.     Left Ear: Tympanic membrane, ear canal and external ear normal.     Nose: Nose normal.     Mouth/Throat:     Mouth: Mucous membranes are moist.     Pharynx: Oropharynx is clear.  Eyes:     Conjunctiva/sclera: Conjunctivae normal.  Cardiovascular:     Rate and Rhythm: Regular rhythm.     Heart sounds: Normal heart sounds.  Pulmonary:     Effort: Pulmonary effort is normal.     Breath sounds: Normal breath sounds.     Comments: Lungs clear to auscultation Musculoskeletal:     Cervical back: Neck supple.  Skin:     General: Skin is warm.     Comments: No rashes or urticarial lesions noted  Neurological:     Mental Status: She is alert and oriented to person, place, and time.  Psychiatric:        Mood and Affect: Mood normal.        Behavior: Behavior normal.        Thought Content: Thought content normal.        Judgment: Judgment normal.     Diagnostics:  Skin prick testing today is: Positive to a couple weeds with adequate controls.  Intradermal testing is positive to major mold mix 2.  After  intradermal skin testing she reports she now has itching on the skin of her throat.  She denies any cardiorespiratory or gastrointestinal symptoms.  2:52 PM 10 mg of Zyrtec was given. Physical exam and vitals are stable.no rash or hives noted. Called Yolanda at 5:00 PM and she reports that her itching is better.   Airborne Adult Perc - 08/31/23 1333     Time Antigen Placed 1343  Allergen Manufacturer Greer    Location Back    Number of Test 55    1. Control-Buffer 50% Glycerol Negative    2. Control-Histamine 3+    3. Bahia Negative    4. French Southern Territories Negative    5. Johnson Negative    6. [place] Blue Negative    7. Meadow Fescue Negative    8. Perennial Rye Negative    9. Timothy Negative    10. Ragweed Mix Negative    11. Cocklebur Negative    12. Plantain,  English Negative    13. Baccharis Negative    14. Dog Fennel Negative    15. Russian Thistle Negative    16. Lamb's Quarters Negative    17. Sheep Sorrell Negative    18. Rough Pigweed Negative    19. Marsh Elder, Rough 2+    20. Mugwort, Common 2+    21. Box, Elder Negative    22. Cedar, red Negative    23. Sweet Gum Negative    24. Pecan Pollen Negative    25. Pine Mix Negative    26. Walnut, Black Pollen Negative    27. Red Mulberry Negative    28. Ash Mix Negative    29. Birch Mix Negative    30. Beech American Negative    31. Cottonwood, [place] Negative    32. Hickory, White Negative    33. Maple Mix Negative    34.  Oak, [place] Mix Negative    35. Sycamore Eastern Negative    36. Alternaria Alternata Negative    37. Cladosporium Herbarum Negative    38. Aspergillus Mix Negative    39. Penicillium Mix Negative    40. Bipolaris Sorokiniana (Helminthosporium) Negative    41. Drechslera Spicifera (Curvularia) Negative    42. Mucor Plumbeus Negative    43. Fusarium Moniliforme Negative    44. Aureobasidium Pullulans (pullulara) Negative    45. Rhizopus Oryzae Negative    46. Botrytis Cinera Negative    48. Phoma Betae Negative    49. Dust Mite Mix Negative    50. Cat Hair 10,000 BAU/ml Negative    51.  Dog Epithelia Negative    52. Mixed Feathers Negative    53. Horse Epithelia Negative    54. Cockroach, German Negative    55. Tobacco Leaf Negative             Intradermal - 08/31/23 1422     Time Antigen Placed 1427    Allergen Manufacturer Waynette Buttery    Location Back    Number of Test 15    Control Negative    Bahia Negative    French Southern Territories Negative    Johnson Negative    7 Grass Negative    Ragweed Mix Negative    Tree Mix Negative    Mold 1 Negative    Mold 2 2+    Mold 3 Negative    Mold 4 Negative    Mite Mix Negative    Cat Negative    Dog Negative    Cockroach Negative              Assessment and Plan: 1. Seasonal and perennial allergic rhinitis   2. Nasal polyps   3. Moderate persistent asthma without complication   4. Recurrent sinusitis     No orders of the defined types were placed in this encounter.   Patient Instructions  Nasal polyps Follow up with ENT as scheduled. Recommend Dupixent injections -message sent to Tammy as  she is interested. Continue with budesonide saline wash. Continue Singulair (montelukast) 10mg  daily at night. Environmental allergy testing today is: Positive to a couple weeds with adequate controls.  Intradermal testing is positive to major mold mix 2 Copy of skin test given Start avoidance measures as below  Moderate persistent  asthma- managed by pulmonology Keep follow up with pulmonology.  Daily controller medication(s): continue Advair 1 puff twice a day and rinse mouth after each use.  May use albuterol rescue inhaler 2 puffs every 4 to 6 hours as needed for shortness of breath, chest tightness, coughing, and wheezing.  Monitor frequency of use - if you need to use it more than twice per week on a consistent basis let us know.  Breathing control goals:  Full participation in all desired activities (may need albuterol before activity) Albuterol use two times or less a week on average (not counting use with activity) Cough interfering with sleep two times or less a month Oral steroids no more than once a year No hospitalizations   Recurrent sinusitis Keep track of infections and antibiotics use. If persistent will get bloodwork next to look at immune system.     Follow up with Dr. Selena Batten in 2 months or sooner if needed.   Reducing Pollen Exposure The American Academy of Allergy, Asthma and Immunology suggests the following steps to reduce your exposure to pollen during allergy seasons. Do not hang sheets or clothing out to dry; pollen may collect on these items. Do not mow lawns or spend time around freshly cut grass; mowing stirs up pollen. Keep windows closed at night.  Keep car windows closed while driving. Minimize morning activities outdoors, a time when pollen counts are usually at their highest. Stay indoors as much as possible when pollen counts or humidity is high and on windy days when pollen tends to remain in the air longer. Use air conditioning when possible.  Many air conditioners have filters that trap the pollen spores. Use a HEPA room air filter to remove pollen form the indoor air you breathe.  Control of Mold Allergen Mold and fungi can grow on a variety of surfaces provided certain temperature and moisture conditions exist.  Outdoor molds grow on plants, decaying vegetation and soil.   The major outdoor mold, Alternaria and Cladosporium, are found in very high numbers during hot and dry conditions.  Generally, a late Summer - Fall peak is seen for common outdoor fungal spores.  Rain will temporarily lower outdoor mold spore count, but counts rise rapidly when the rainy period ends.  The most important indoor molds are Aspergillus and Penicillium.  Dark, humid and poorly ventilated basements are ideal sites for mold growth.  The next most common sites of mold growth are the bathroom and the kitchen.  Outdoor [employer] Use air conditioning and keep windows closed Avoid exposure to decaying vegetation. Avoid leaf raking. Avoid grain handling. Consider wearing a face mask if working in moldy areas.  Indoor Mold Control Maintain humidity below 50%. Clean washable surfaces with 5% bleach solution. Remove sources e.g. Contaminated carpets.  Return in about 2 months (around 10/31/2023), or if symptoms worsen or fail to improve.    Thank you for the opportunity to care for this patient.  Please do not hesitate to contact me with questions.  Nehemiah Settle, FNP [facility]

## 2023-09-03 ENCOUNTER — Ambulatory Visit: Payer: BC Managed Care – PPO | Admitting: Physical Therapy

## 2023-09-03 ENCOUNTER — Encounter: Payer: Self-pay | Admitting: Physical Therapy

## 2023-09-03 DIAGNOSIS — M25571 Pain in right ankle and joints of right foot: Secondary | ICD-10-CM

## 2023-09-03 DIAGNOSIS — R262 Difficulty in walking, not elsewhere classified: Secondary | ICD-10-CM

## 2023-09-03 DIAGNOSIS — M6281 Muscle weakness (generalized): Secondary | ICD-10-CM

## 2023-09-03 NOTE — Therapy (Signed)
OUTPATIENT PHYSICAL THERAPY LOWER EXTREMITY TREATMENT   Patient Name: Lindsey Knight MRN: [id number] DOB:[birth year], 62 y.o., female Today's Date: 09/03/2023  END OF SESSION:  PT End of Session - 09/03/23 1100     Visit Number 2    Number of Visits 12    Date for PT Re-Evaluation 11/14/23    Authorization Type $120 copay and VL 30 with 29 left    Authorization - Visit Number 2    Authorization - Number of Visits 29    PT Start Time 1101    PT Stop Time 1145    PT Time Calculation (min) 44 min    Activity Tolerance Patient tolerated treatment well    Behavior During Therapy WFL for tasks assessed/performed             Past Medical History:  Diagnosis Date   Allergy Child   Anemia    Arthritis    Asthma    Eczema    GERD (gastroesophageal reflux disease) 10/22   Hiatal hernia    Osteoporosis    Ulcer 6/23   Vaginal delivery    x 2, 1985, 1990   Past Surgical History:  Procedure Laterality Date   COLONOSCOPY  01/23/2022   normal - 2012   LIPOMA EXCISION Right 03/27/2023   back   NASAL SINUS SURGERY Bilateral 2015   OTHER SURGICAL HISTORY     lipoma removed from back 03/2023   Patient Active Problem List   Diagnosis Date Noted   Trigger thumb, right thumb 07/13/2022   Colon cancer (HCC) 01/03/2022   Crohn's disease (HCC) 12/30/2021    PCP: Jarold Motto, PA  REFERRING PROVIDER: Rodolph Bong, MD   REFERRING DIAG: M77.51 (ICD-10-CM) - Tendinitis of right ankle  THERAPY DIAG:  Pain in right ankle and joints of right foot  Muscle weakness (generalized)  Difficulty in walking, not elsewhere classified  Rationale for Evaluation and Treatment: Rehabilitation  ONSET DATE: July 2024- no known MOI  SUBJECTIVE:   SUBJECTIVE STATEMENT: 09/03/2023 States she was out of town and has had more pain. States she has been wearing compression garments and still feels like her foot catches   EVAL: States she had a lot of right ankle pain and she doesn't  know what she did. States she has a partial tear per MD and she tried a compression brace and nitroglycerin patches. States she had a rash and stopped using the patches. States she has been hesitant about walking as she has pain afterwards but not during.  Reports she has also had difficulty wearing shoes as the pain is right where her shoes touch and has been wearing sandals a lot.  Has been having a lot of difficulty sleeping at night as she tosses and turns and when she does this her ankle moves in a way that wakes her up.  Reports she is on 3 rounds of steroids due to an ENT issue and does not know if this is some due to her helped her symptoms at all.  Reports overall the swelling is better but continues to have swelling and pain along the outside of her ankle.    PERTINENT HISTORY: N/A PAIN:  Are you having pain? Yes: NPRS scale: 4/10 Pain location: right lateral ankle Pain description: sharp Aggravating factors: shoes, turn, sleep tossing and turning Relieving factors: rest  PRECAUTIONS: None  RED FLAGS: None   WEIGHT BEARING RESTRICTIONS: No  FALLS:  Has patient fallen in last 6 months? Yes. Number of  falls 2- muddy yard - rushing in garden    OCCUPATION: retired Engineer, civil (consulting)   PLOF: Independent likes to walk   PATIENT GOALS: to be able to walk and sleep better    OBJECTIVE:   DIAGNOSTIC FINDINGS: right ankle xray 08/09/23 awaiting results  PATIENT SURVEYS:  FOTO 60%   COGNITION: Overall cognitive status: Within functional limits for tasks assessed     SENSATION: Not tested  EDEMA:  Circumferential:    At ankle L 24.0cm, R 24.75cm Figure 8: 49.5cm L, 51.0cm R  Mid calf circumferential R 39.0 cm L 38.0  POSTURE: rounded shoulders, forward head, and posterior pelvic tilt  PALPATION: Tenderness to palpation along right lateral ankle and medial and lateral lower leg on right side.  Swelling noted throughout right lower leg and tightness in this area    LE  Measurements Lower Extremity Right EVAL Left EVAL   A/PROM MMT A/PROM MMT  Hip Flexion      Hip Extension      Hip Abduction  3  4-  Hip Adduction      Hip Internal rotation      Hip External rotation      Knee Flexion      Knee Extension      Ankle Dorsiflexion knee bent/straight 5/10 4** 10/10 5  Ankle Plantarflexion 45 Unable painful 55 20 SL heel raises  Ankle Inversion 30 (rests in 20) 3+* 30 (rests in 10) 4+  Ankle Eversion 0 3+ 0 4+   (Blank rows = not tested) * pain **favors extensor digitorum     GAIT: Distance walked: 25 feet within clinic Assistive device utilized: None Level of assistance: Complete Independence Comments: Reduced weightbearing on right lower extremity in stance phase, ankle and excessive inversion right foot reduced toe off   TODAY'S TREATMENT:                                                                                                                              DATE:   09/03/2023 Reviewed Thera-Band exercises MD provided patient Therapeutic Exercise:  Aerobic: Supine: bridge with hip abd blue band x10 5-10" holds Prone:  Seated: Right calcaneal eversion stretch 3 minutes, piriformis stretch x3 30" holds B, hip abd blue band - x20 5" holds - foot flat  Standing: Neuromuscular Re-education: Manual Therapy: joint mobilizations to right foot - tolerated well. STM to Achille's tension and post tib - tolerated moderately well  Therapeutic Activity: Self Care: Trigger Point Dry Needling:  Modalities:    PATIENT EDUCATION:  Education details: on anatomy, rationale behind interventions, on walking mechanics, on link between hip and ankle Person educated: Patient Education method: Explanation, Demonstration, and Handouts Education comprehension: verbalized understanding   HOME EXERCISE PROGRAM: [id number]  ASSESSMENT:  CLINICAL IMPRESSION: 09/03/2023 Session focused on review of HEP, manual and additional of hip exercises secondary  to hip weakness with walking. Educated patient on presentation and plan moving forward. Provided patient with KT  tape to trial to see if she reacts to tape prior to taping foot. Overall tolerated session well and will continue to benefit form skilled PT at this time.    EVAL:Patient presents to physical therapy with complaints of right ankle pain that started about 6 months ago and has improved but continues to bother her with functional activity and sleeping at night.  Patient presents with weakness, range of motion limitations as well as continued swelling along right ankle despite repetitive use of steroids over the last few months due to an ENT issue.  Educated patient current presentation as well as plan moving forward.  Focused on managing swelling in right lower extremity secondary to this likely causing increased pain at night.  Answered all questions and patient would greatly benefit from skilled PT to improve overall function and quality of life.  OBJECTIVE IMPAIRMENTS: decreased activity tolerance, difficulty walking, decreased ROM, decreased strength, improper body mechanics, and pain.   ACTIVITY LIMITATIONS: sleeping, stairs, and locomotion level  PARTICIPATION LIMITATIONS: community activity  PERSONAL FACTORS: Time since onset of injury/illness/exacerbation are also affecting patient's functional outcome.   REHAB POTENTIAL: Good  CLINICAL DECISION MAKING: Stable/uncomplicated  EVALUATION COMPLEXITY: Low   GOALS: Goals reviewed with patient? yes  SHORT TERM GOALS: Target date: 09/19/2023   Patient will be independent in self management strategies to improve quality of life and functional outcomes. Baseline: New Program Goal status: INITIAL  2.  Patient will report at least 50% improvement in overall symptoms and/or function to demonstrate improved functional mobility Baseline: 0% better Goal status: INITIAL  3.  Patient will be able to sleep through the night and not wake  up due to right ankle pain Baseline: Waking up due to pain Goal status: INITIAL     LONG TERM GOALS: Target date: 11/14/23  Patient will report at least 75% improvement in overall symptoms and/or function to demonstrate improved functional mobility Baseline: 0% better Goal status: INITIAL  2.  Patient will improve score on FOTO outcomes measure to projected score to demonstrate overall improved function and QOL Baseline: see above Goal status: INITIAL  3.  Patient will demonstrate within 0.5 cm of swelling difference between right and left lower leg and ankle. Baseline: See above Goal status: INITIAL  4.  Patient will be able to confidently walk for 15 to 20 minutes without pain following to improve mobility and community. Baseline: Unable painful afterwards Goal status: INITIAL    PLAN:  PT FREQUENCY: 1-2x/week For total of 12 visits over 12 weeks certification period  PT DURATION: 12 weeks  PLANNED INTERVENTIONS: Therapeutic exercises, Therapeutic activity, Neuromuscular re-education, Balance training, Gait training, Patient/Family education, Self Care, Joint mobilization, Joint manipulation, Stair training, Vestibular training, Canalith repositioning, Orthotic/Fit training, Prosthetic training, DME instructions, Aquatic Therapy, Dry Needling, Electrical stimulation, Spinal manipulation, Spinal mobilization, Cryotherapy, Moist heat, Taping, Traction, Ultrasound, Ionotophoresis 4mg /ml Dexamethasone, Manual therapy, and Re-evaluation.   PLAN FOR NEXT SESSION: f/u KT tape, hip MMT, calcaneal eversion  Edema mobilization, follow-up with compression garments, lateral ankle strengthening progressed towards standing exercises once tolerated and balance to work on frontal plane instability and weakness   11:59 AM, 09/03/23 Tereasa Coop, DPT Physical Therapy with [facility]

## 2023-09-05 ENCOUNTER — Encounter (INDEPENDENT_AMBULATORY_CARE_PROVIDER_SITE_OTHER): Payer: Self-pay | Admitting: Otolaryngology

## 2023-09-05 ENCOUNTER — Ambulatory Visit (INDEPENDENT_AMBULATORY_CARE_PROVIDER_SITE_OTHER): Payer: BC Managed Care – PPO | Admitting: Otolaryngology

## 2023-09-05 VITALS — BP 131/82 | HR 81 | Ht 63.0 in | Wt 158.6 lb

## 2023-09-05 DIAGNOSIS — J3089 Other allergic rhinitis: Secondary | ICD-10-CM

## 2023-09-05 DIAGNOSIS — J343 Hypertrophy of nasal turbinates: Secondary | ICD-10-CM

## 2023-09-05 DIAGNOSIS — J329 Chronic sinusitis, unspecified: Secondary | ICD-10-CM

## 2023-09-05 DIAGNOSIS — J339 Nasal polyp, unspecified: Secondary | ICD-10-CM

## 2023-09-05 DIAGNOSIS — R0981 Nasal congestion: Secondary | ICD-10-CM

## 2023-09-05 DIAGNOSIS — J324 Chronic pansinusitis: Secondary | ICD-10-CM

## 2023-09-05 DIAGNOSIS — J342 Deviated nasal septum: Secondary | ICD-10-CM

## 2023-09-05 NOTE — Progress Notes (Unsigned)
ENT Progress Note  Update 09/05/23 She returns for follow-up after imaging and labs. Started budesonide rinses and on daily antihistamine. Saw allergy and was tested for allergies. Dupixent was recommended. Insurance pre-approval for Dupixent is pending. She completed sinus CT. She feels somewhat better currently. No acute sinus infections since last office visit.   Initial Evaluation:  Reason for Consult: chronic headaches and sinusitis on CT max/face    HPI: Lindsey Knight is an 62 y.o. female with hx of chronic nasal congestion, frequent sinus infections for several decades and chronic headaches, here for initial evaluation of her sx. She has a longstanding history of sinus infections and allergy symptoms dating back several decades ago.  She received allergy shots for years in [place], moved to [facility] and allergy testing was negative, then moved to [place], again allergy skin testing was negative per report . she then started to have recurrent sinus infections, required multiple courses of antibiotics and steroids. She then had sinus surgery 13 yrs ago, and after sinus surgery her sinus infections became less frequent.  She reports that her sinus surgeon did IgE levels at the time and IgA levels were low.  She is on Zyrtec daily and uses Atrovent spray and Flonase nasal spray BID. At night she takes Singulair. She uses albuterol when her symptoms worsen. She took abx and steroids after her most recent bout of sinus infection and currently feeling better.  Denies history of allergies to aspirin or NSAIDs.  She had CT max face/MRI brain ordered due to persistent headache. MRI brain was concerning for potential defect at cribriform plate and bilateral sinusitis.  CT max face was ordered in follow-up and it did not show a defect at the skull base, but demonstrated bilateral sinusitis involving maxillary and ethmoid sinuses and evidence of prior sinus surgery.  Records Reviewed:  Office visit with Dr Derek Jack  05/18/23 Lindsey Knight is a 62 year old woman, never smoker with history of asthma who returns to pulmonary clinic for cough.    She reports sinus drainage with post nasal drip and some sinus pressure over recent weeks. She has significant cough that can keep her up at night. She is using advair 250-59mcg 1 puff twice daily and as needed albuterol. She is using flonase 1 spray per nostril daily. She continues to have reflux symptoms despite famotidine 20mg  twice daily.    OV 10/09/22 She reports increased cough over the past 2 months. She describes an event where she was bending over in the community garden and possibly had a significant reflux episode. She denies any viral illnesses recently.    She saw GI 08/01/22 with plan to reduce PPI dosing then transition to famotidine for 2 weeks then to use as needed. She reports the famotidine did not help, she reports burning sensation in her chest and throat. She then started omeprazole OTC which seems to have reduced her reflux symptoms.    She has advair 250-37mcg 1 puff twice daily that she has been using as needed. She has albuterol as needed as well. She has not felt much relief.    She has not been on steroids or antibiotics.    OV 03/06/22 Chest x-ray today shows clearing of the right upper lobe infiltrates. Will await final radiology read.    She is using advair 100-35mcg 1 puff twice daily as needed along with daily allegra and montelukast for her asthma. No night time awakenings. No issues with spring allergies.   She has been  feeling well since last visit. He cough has significantly decreased. She recently had EGD and C-scope and started on PPI therapy for GERD.    OV 01/03/22 Patient reports having respiratory illness in mid-December 2022 and was treated with Zpak on 12/13. PCP note from 11/08/2021 reviewed. Chest x-ray on 12/30 showed increased lung markings with mild right mid lung and left basilar linear scaring or atelectasis. She was also  noted to have increased platelet count. She had CT Chest on 11/29/21 that showed mediastinal, axillary and hilar lymphadenopathy along with patchy ground-glass and adjacent focal clustered nodularity in the right upper lobe and adjacent band like atelectasis/scarring at the junction of the right upper lobe.    Patient reports resolution of her infectious symptoms from December and is feeling at her baseline. She will occasionally have some right sided discomfort but it is overall improved from December. She notices the pain when laying down or moving when she sleeps. She denies any shortness of breath, fevers, chills or night sweats. She denies hemoptysis. She does have joint aches in general which include her knees, hips and occasional hand stiffness in the mornings.   She has history of asthma and mainly has issues with reactive airways disease when triggered by viral infections and uses inhalers as needed. She denies issues with seasonal allergies. She had sinus surgery in the past with significant reduction in frequency of sinus infections.    She is a never smoker. She is a retired Engineer, civil (consulting). She has a sister with crohn's disease and her mother had colon cancer.        Office note by Jarold Motto PA Pt c/o headache x 1 week on right side of head behind ear. Took 2 extra strength Tylenol last night with relief. Has pain now  Headache She has been having an intermittent headache since 06/13/23.  She is taking extra strength Tylenol morning and evening with some relief. Took Sudafed two days ago without relief. Pain is located behind right ear. Rates pain 7/10 and describes as sharp throbbing pain. No change in lifestyle, eating, stress. Denies any recent head trauma. This kind of headache is unusual for her. She is not a "headache" person. Treated recently for maxillary sinusitis on 05/18/23.   Feels somewhat disoriented, especially on the right side of her vision field. 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 She has been taking alendronate for one year - unsure if this is contributing to her symptom(s).    No family history of stroke. Denies chest pain, shortness of breath. No change in bedding, pillows. Has not had her yearly eye exam for this year.   Denies rash. Had bilateral nasal sinus surgery in 2015.  Denies sinus pressure, flu symptoms.   New daily persistent headache Neuro exam is wnl No red flags on my exam Will obtain MR without contrast to further evaluate given persistence and age >45 If new/worsening symptom(s), likely will need to go to the ER Consider neurology referral    Past Medical History:  Diagnosis Date   Allergy Child   Anemia    Arthritis    Asthma    Eczema    GERD (gastroesophageal reflux disease) 10/22   Hiatal hernia    Osteoporosis    Ulcer 6/23   Vaginal delivery    x 2, 1985, 1990    Past Surgical History:  Procedure Laterality Date   COLONOSCOPY  01/23/2022   normal - 2012   LIPOMA EXCISION Right 03/27/2023  back   NASAL SINUS SURGERY Bilateral 2015   OTHER SURGICAL HISTORY     lipoma removed from back 03/2023    Family History  Problem Relation Age of Onset   Colon cancer Mother    Cancer Mother    Hearing loss Mother    Heart failure Father    Asthma Father    COPD Father    Hearing loss Father    Crohn's disease Sister    Pancreatic cancer Other    Ovarian cancer Other    Uterine cancer Other    Allergic rhinitis Grandson    Food Allergy Grandson    Asthma Daughter     Social History:  reports that she has never smoked. She has never been exposed to tobacco smoke. She has never used smokeless tobacco. She reports that she does not currently use alcohol. She reports that she does not currently use drugs after having used the following drugs: Marijuana.  Allergies:  Allergies  Allergen Reactions   Other Hives    Adhesives   Tape Rash    Medications: I have reviewed  the patient's current medications.  The PMH, PSH, Medications, Allergies, and SH were reviewed and updated.  ROS: Constitutional: Negative for fever, weight loss and weight gain. Cardiovascular: Negative for chest pain and dyspnea on exertion. Respiratory: Is not experiencing shortness of breath at rest. Gastrointestinal: Negative for nausea and vomiting. Neurological: Negative for headaches. Psychiatric: The patient is not nervous/anxious  Blood pressure 131/82, pulse 81, height 5\' 3"  (1.6 m), weight 158 lb 9.6 oz (71.9 kg), SpO2 97%.  PHYSICAL EXAM:  Exam: General: Well-developed, well-nourished Respiratory Respiratory effort: Equal inspiration and expiration without stridor Cardiovascular Peripheral Vascular: Warm extremities with equal color/perfusion Eyes: No nystagmus with equal extraocular motion bilaterally Neuro/Psych/Balance: Patient oriented to person, place, and time; Appropriate mood and affect; Gait is intact with no imbalance; Cranial nerves I-XII are intact Head and Face Inspection: Normocephalic and atraumatic without mass or lesion Facial Strength: Facial motility symmetric and full bilaterally ENT Pinna: External ear intact and fully developed External Nose: No scar or anatomic deformity  Procedure:none Studies Reviewed:CT max/face CLINICAL DATA:  Sinus mass on recent MRI.   EXAM: CT sinuses 08/22/23   FINDINGS: Paranasal sinuses:   Frontal: Persistent, near complete opacification bilaterally with associated chronic osteitis.   Ethmoid: Moderate residual air cell opacification bilaterally, predominantly involving anterior air cells and overall greatly improved from prior (particularly posteriorly).   Maxillary: Prominent circumferential, mildly nodular/polypoid mucosal thickening bilaterally with associated partial mineralization of sinus contents and with chronic osteitis. Improved aeration from prior.   Sphenoid: Minimal mucosal thickening  and possible trace fluid bilaterally, improved from prior.   Right ostiomeatal unit: Patent maxillary antrostomy.   Left ostiomeatal unit: Patent maxillary antrostomy.   Nasal passages: Bilateral middle turbinate concha bullosa. Residual mucosal thickening primarily in the anterosuperior aspect of the nasal cavity, improved from prior. Essentially midline nasal septum.   Anatomy: No pneumatization superior to anterior ethmoid notches. Keros II with the left fovea ethmoidalis again noted to be higher than on the right. Sellar sphenoid pneumatization pattern. No dehiscence of carotid or optic canals. No onodi cell.   Other: Clear mastoid air cells and middle ear cavities. Unremarkable appearance of the orbits and included portion of the brain.   IMPRESSION: Overall improved pansinusitis although with persistent near complete opacification of the frontal sinuses.  CT MAXILLOFACIAL WITH CONTRAST 07/17/23   TECHNIQUE: Multidetector CT imaging of the maxillofacial structures was performed  with intravenous contrast. Multiplanar CT image reconstructions were also generated.   RADIATION DOSE REDUCTION: This exam was performed according to the departmental dose-optimization program which includes automated exposure control, adjustment of the mA and/or kV according to patient size and/or use of iterative reconstruction technique.   CONTRAST:  75mL OMNIPAQUE IOHEXOL 300 MG/ML  SOLN   COMPARISON:  Brain MRI 07/10/2023   FINDINGS: Osseous: No fracture or destructive changes   Orbits: Unremarkable   Sinuses: Essentially confluent opacification of maxillary, ethmoid, and frontal sinuses bilaterally with diffuse obstruction of the upper nasal cavity by mucosal thickening. Partially mineralized debris is seen in the maxillary sinuses and extending towards the widened ostia. No clear destructive changes, asymmetry at the left cribriform plate on prior brain MRI is likely due to the  higher left fovea ethmoidalis, a normal variant. No invasive features are seen. The nasal septum is nearly midline.   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 No destructive or invasive features.  MRI Brain EXAM: MRI HEAD WITHOUT CONTRAST   TECHNIQUE: Multiplanar, multiecho pulse sequences of the brain and surrounding structures were obtained without intravenous contrast.   COMPARISON:  None Available.   FINDINGS: Brain: Negative for acute infarct. Small microhemorrhages in the inferomedial right frontal and left frontal lobe. No hydrocephalus. No extra-axial fluid collection. No mass effect. No mass lesion. Sequela of mild chronic microvascular ischemic change.   Vascular: Normal flow voids.   Skull and upper cervical spine: Normal marrow signal.   Sinuses/Orbits: No middle ear or mastoid effusion. There is near-complete opacification of the bilateral maxillary, ethmoid, and frontal sinuses. There is partial opacification of the bilateral sphenoid sinuses. The cribriform plate appears somewhat irregular and there appears to be mass effect on the olfactory sulcus (series 117, image 63). The integrity of the lamina papyracea on the left is also uncertain (series 117, image 56).   Other: None.   IMPRESSION: 1. No acute intracranial abnormality. 2. Near-complete opacification of the bilateral maxillary, ethmoid, and frontal sinuses. The cribriform plate appears somewhat irregular and there appears to be mass effect on the olfactory sulcus. The integrity of the lamina papyracea on the left is also uncertain. Recommend further evaluation with a dedicated contrast-enhanced maxillofacial CT.  Assessment/Plan: Encounter Diagnoses  Name Primary?   Chronic pansinusitis Yes   Chronic nasal congestion    Deviated nasal septum    Hypertrophy of both inferior nasal  turbinates    Nasal polyps    Environmental and seasonal allergies     Chronic sinusitis with nasal polyps + septal deviation and inferior turbinate hypertrophy - s/p FESS several years ago, with sx recurrence and evidence of severe inflammation bilateral maxillary and ethmoid sinuses -Initially had MRI brain which showed chronic sinus disease and potential irregularity at cribriform plat, but max face CT done following MRI brain did not reveal any bony defects in the skull base including area of concern along the cribriform plate -I discussed findings of both imaging studies with the patient -Based on my evaluation today including nasal endoscopy she has evidence of polyp regrowth and chronic nasal congestion along with septal deviation inferior turban hypertrophy and purulent secretions along the left middle meatus consistent with chronic rhinosinusitis with nasal polyps Plan  - she had recent CBC with diff, will order total IgE - CT sinuses brainlab protocol - CT max face with fewer images - need for image guidance during surgery  - nasal steroid  rinses with [facility] nasal saline/mometasone to shrink the polyps and help with sx post-op -  Doxycycline 100 mg BID for sinus infection and Medrol Dose pack (4 mg tab pack, take as directed)  - Allergy referral for consideration to receive biologic such as Dupixent vs Xolair (hx of asthma) - I discussed management of CRSwNP and importance of surgical options and medical management of allergies and chronic inflammation - we discussed risks and benefits of surgery and she would like to proceed - will schedule for revision sinus surgery (bilateral revision FESS, maxillary antrostomy b/l, vs balloon sinuplasty, bilateral total ethmoidectomy, bilateral nasal polypectomy, possible inferior turbinate reduction, possible septoplasty) -She reports history of normal IgA when she had her initial work[place], she  inquired about the significance of the test result, I advised her to have immunoglobulin levels checked with an allergist, who can better discussed the significance of it if her levels are abnormal  2. Environmental allergies and hx of asthma -Continue Zyrtec 10 mg daily -Stop Flonase after initiation of nasal steroid rinses -Continue Singulair  3. Asthma  -Continue albuterol as needed -Continue Singulair 10 mg daily -See pulmonary as needed established with Melody Comas, MD   Update 09/05/23 She saw Allergy, and had testing done. She had CT done. She was allergic to aspergillus and a couple of weeds on allergy testing with Dr Selena Batten. Being considered for Dupixent.   Chronic sinusitis with nasal polyps + septal deviation and inferior turbinate hypertrophy - s/p FESS several years ago, with sx recurrence and evidence of severe inflammation bilateral maxillary and ethmoid sinuses on CT max/face a few months ago.  -Initially had MRI brain which showed chronic sinus disease and potential irregularity at cribriform plat, but max face CT done following MRI brain did not reveal any bony defects in the skull base including area of concern along the cribriform plate -I discussed findings of both imaging studies with the patient -Based on nasal endoscopy during initial office visit she has evidence of polyp regrowth and chronic nasal congestion along with septal deviation inferior turbinate hypertrophy and purulent secretions along the left middle meatus consistent with chronic rhinosinusitis with nasal polyps - took abx and steroids and repeat CT sinuses was done - which showed overall improved paranasal sinus disease with persistent opacification of frontal sinuses partial opacification of maxillary and ethmoid sinuses bilaterally -I reviewed imaging findings with the patient and we will plan for repeat nasal endoscopy in a few weeks prior to her scheduled sinus procedure - She had elevated total IgE    Plan  - continue nasal steroid rinses with [employer] [facility] nasal saline/mometasone to shrink the polyps and help with sx post-op - proceed with Dupixent if able to get insurance approval - I discussed management of CRSwNP and importance of surgical options and medical management of allergies and chronic inflammation - we discussed risks and benefits of surgery and she would like to proceed - will plan for revision sinus surgery (based on imaging results we will plan for bilateral maxillary sinus lavage, removal of the residual ethmoid partitions and frontal balloon sinuplasty and inferior turbinate reduction.  -She reports history of normal IgA when she had her initial workup [place], she inquired about the significance of the test result, I advised her to have immunoglobulin levels checked with an allergist, who can better discussed the significance of it if her levels are abnormal  2. Environmental  allergies and hx of asthma -Continue Zyrtec 10 mg daily -continue nasal steroid rinses with Budesonide -Continue Singulair - initiate Dupixent if a candidate and approved by insurance  3. Asthma  -Continue albuterol as needed -Continue Singulair 10 mg daily -See pulmonary as needed established with Melody Comas, MD  RTC in a few weeks, will plan repeat nasal endoscopy and will discuss details of planned surgery.  Ashok Croon, MD Otolaryngology [facility] Phone: [phone] Fax: [phone]    09/06/2023, 5:56 AM

## 2023-09-11 ENCOUNTER — Other Ambulatory Visit: Payer: Self-pay

## 2023-09-11 ENCOUNTER — Other Ambulatory Visit (HOSPITAL_COMMUNITY): Payer: Self-pay

## 2023-09-11 ENCOUNTER — Ambulatory Visit: Payer: BC Managed Care – PPO | Admitting: Physical Therapy

## 2023-09-11 ENCOUNTER — Encounter: Payer: Self-pay | Admitting: Physical Therapy

## 2023-09-11 ENCOUNTER — Telehealth: Payer: Self-pay | Admitting: *Deleted

## 2023-09-11 DIAGNOSIS — M25571 Pain in right ankle and joints of right foot: Secondary | ICD-10-CM

## 2023-09-11 DIAGNOSIS — R262 Difficulty in walking, not elsewhere classified: Secondary | ICD-10-CM

## 2023-09-11 DIAGNOSIS — M6281 Muscle weakness (generalized): Secondary | ICD-10-CM | POA: Diagnosis not present

## 2023-09-11 MED ORDER — DUPIXENT 300 MG/2ML ~~LOC~~ SOSY
300.0000 mg | PREFILLED_SYRINGE | SUBCUTANEOUS | 11 refills | Status: DC
  Filled 2023-09-11: qty 4, 28d supply, fill #0
  Filled 2023-10-02: qty 4, 28d supply, fill #1
  Filled 2023-10-26 (×2): qty 4, 28d supply, fill #2
  Filled 2023-11-28: qty 4, 28d supply, fill #3
  Filled 2023-12-24: qty 4, 28d supply, fill #4
  Filled 2024-01-22: qty 4, 28d supply, fill #5

## 2023-09-11 NOTE — Telephone Encounter (Signed)
Perfect. Thank you!

## 2023-09-11 NOTE — Progress Notes (Signed)
Specialty Pharmacy Initial Fill Coordination Note  Lindsey Knight is a 62 y.o. female contacted today regarding refills of specialty medication(s) Dupilumab   Patient requested Courier to Provider Office   Delivery date: 09/13/23   Verified address: [address] [place], [place] [address]   Medication will be filled on 10/23.   Patient is aware of $0.00 copayment with co-pay card

## 2023-09-11 NOTE — Therapy (Signed)
OUTPATIENT PHYSICAL THERAPY LOWER EXTREMITY TREATMENT   Patient Name: Lindsey Knight MRN: [id number] DOB:Sep 18, 1961, 62 y.o., female Today's Date: 09/11/2023  END OF SESSION:  PT End of Session - 09/11/23 1215     Visit Number 3    Number of Visits 12    Date for PT Re-Evaluation 11/14/23    Authorization Type $120 copay and VL 30 with 29 left    Authorization - Visit Number 3    Authorization - Number of Visits 29    PT Start Time 1216    PT Stop Time 1257    PT Time Calculation (min) 41 min    Activity Tolerance Patient tolerated treatment well    Behavior During Therapy WFL for tasks assessed/performed             Past Medical History:  Diagnosis Date   Allergy Child   Anemia    Arthritis    Asthma    Eczema    GERD (gastroesophageal reflux disease) 10/22   Hiatal hernia    Osteoporosis    Ulcer 6/23   Vaginal delivery    x 2, 1985, 1990   Past Surgical History:  Procedure Laterality Date   COLONOSCOPY  01/23/2022   normal - 2012   LIPOMA EXCISION Right 03/27/2023   back   NASAL SINUS SURGERY Bilateral 2015   OTHER SURGICAL HISTORY     lipoma removed from back 03/2023   Patient Active Problem List   Diagnosis Date Noted   Trigger thumb, right thumb 07/13/2022   Colon cancer (HCC) 01/03/2022   Crohn's disease (HCC) 12/30/2021    PCP: Jarold Motto, PA  REFERRING PROVIDER: Rodolph Bong, MD   REFERRING DIAG: M77.51 (ICD-10-CM) - Tendinitis of right ankle  THERAPY DIAG:  Pain in right ankle and joints of right foot  Muscle weakness (generalized)  Difficulty in walking, not elsewhere classified  Rationale for Evaluation and Treatment: Rehabilitation  ONSET DATE: July 2024- no known MOI  SUBJECTIVE:   SUBJECTIVE STATEMENT: 09/11/2023 States sometimes she feels her foot feels better and then sometimes she doesn't know why it isn't all better.    EVAL: States she had a lot of right ankle pain and she doesn't know what she did. States she  has a partial tear per MD and she tried a compression brace and nitroglycerin patches. States she had a rash and stopped using the patches. States she has been hesitant about walking as she has pain afterwards but not during.  Reports she has also had difficulty wearing shoes as the pain is right where her shoes touch and has been wearing sandals a lot.  Has been having a lot of difficulty sleeping at night as she tosses and turns and when she does this her ankle moves in a way that wakes her up.  Reports she is on 3 rounds of steroids due to an ENT issue and does not know if this is some due to her helped her symptoms at all.  Reports overall the swelling is better but continues to have swelling and pain along the outside of her ankle.    PERTINENT HISTORY: N/A PAIN:  Are you having pain? Yes: NPRS scale: 5/10 Pain location: right lateral ankle Pain description: sharp Aggravating factors: shoes, turn, sleep tossing and turning Relieving factors: rest  PRECAUTIONS: None  RED FLAGS: None   WEIGHT BEARING RESTRICTIONS: No  FALLS:  Has patient fallen in last 6 months? Yes. Number of falls 2- muddy yard -  rushing in garden    OCCUPATION: retired Engineer, civil (consulting)   PLOF: Independent likes to walk   PATIENT GOALS: to be able to walk and sleep better    OBJECTIVE:   DIAGNOSTIC FINDINGS: right ankle xray 08/09/23 awaiting results  PATIENT SURVEYS:  FOTO 60%   COGNITION: Overall cognitive status: Within functional limits for tasks assessed     SENSATION: Not tested  EDEMA:  Circumferential:    At ankle L 24.0cm, R 24.75cm Figure 8: 49.5cm L, 51.0cm R  Mid calf circumferential R 39.0 cm L 38.0  POSTURE: rounded shoulders, forward head, and posterior pelvic tilt  PALPATION: Tenderness to palpation along right lateral ankle and medial and lateral lower leg on right side.  Swelling noted throughout right lower leg and tightness in this area    LE Measurements Lower Extremity  Right EVAL Left EVAL   A/PROM MMT A/PROM MMT  Hip Flexion      Hip Extension      Hip Abduction  3  4-  Hip Adduction      Hip Internal rotation      Hip External rotation      Knee Flexion      Knee Extension      Ankle Dorsiflexion knee bent/straight 5/10 4** 10/10 5  Ankle Plantarflexion 45 Unable painful 55 20 SL heel raises  Ankle Inversion 30 (rests in 20) 3+* 30 (rests in 10) 4+  Ankle Eversion 0 3+ 0 4+   (Blank rows = not tested) * pain **favors extensor digitorum     GAIT: Distance walked: 25 feet within clinic Assistive device utilized: None Level of assistance: Complete Independence Comments: Reduced weightbearing on right lower extremity in stance phase, ankle and excessive inversion right foot reduced toe off   TODAY'S TREATMENT:                                                                                                                              DATE:   09/11/2023  Therapeutic Exercise:  Aerobic: Supine: Prone:  Seated: KT tape applied to right ankle to reduce excessive supination.  Tolerated tape well reporting comfort after application.    Standing:- walking in shoes with and without inserts - education on walking mechanics - 15 minutes Neuromuscular Re-education: Manual Therapy: joint mobilizations to right foot - tolerated well. STM to Achille's tension and peroneals, percussion gun with towel to right lower extremity- tolerated moderately well  Therapeutic Activity: Self Care: Trigger Point Dry Needling:  Modalities:    PATIENT EDUCATION:  Education details: on anatomy, rationale behind interventions, on on when and how to remove KT tape if needed, on different types of shoes and inserts to reduce stress on feet, on importance of hip exercises to reduce excessive supination Person educated: Patient Education method: Explanation, Demonstration, and Handouts Education comprehension: verbalized understanding   HOME EXERCISE  PROGRAM: [id number]  ASSESSMENT:  CLINICAL IMPRESSION: 09/11/2023 Session focused on continued education of current presentation  as well as importance of hip exercises to reduce excessive supination.  Practiced walking with and without shoes with and without inserts and focusing on different things.  Tolerated inserts best with improved walking mechanics and patient also reporting reduce stress on lateral side of ankle.  Educated patient and how she lands very heavily and has excessive sagittal plane motion with ambulation.  Discussed how hip can contribute to this.  Applied KT tape with patient reporting comfort after application.  Overall patient is improving reporting less sleep at night but continues to have pain.  Educated patient and continued interventions and typical healing timeframe.  Patient will continue benefit from skilled PT at this time.  EVAL:Patient presents to physical therapy with complaints of right ankle pain that started about 6 months ago and has improved but continues to bother her with functional activity and sleeping at night.  Patient presents with weakness, range of motion limitations as well as continued swelling along right ankle despite repetitive use of steroids over the last few months due to an ENT issue.  Educated patient current presentation as well as plan moving forward.  Focused on managing swelling in right lower extremity secondary to this likely causing increased pain at night.  Answered all questions and patient would greatly benefit from skilled PT to improve overall function and quality of life.  OBJECTIVE IMPAIRMENTS: decreased activity tolerance, difficulty walking, decreased ROM, decreased strength, improper body mechanics, and pain.   ACTIVITY LIMITATIONS: sleeping, stairs, and locomotion level  PARTICIPATION LIMITATIONS: community activity  PERSONAL FACTORS: Time since onset of injury/illness/exacerbation are also affecting patient's functional  outcome.   REHAB POTENTIAL: Good  CLINICAL DECISION MAKING: Stable/uncomplicated  EVALUATION COMPLEXITY: Low   GOALS: Goals reviewed with patient? yes  SHORT TERM GOALS: Target date: 09/19/2023   Patient will be independent in self management strategies to improve quality of life and functional outcomes. Baseline: New Program Goal status: INITIAL  2.  Patient will report at least 50% improvement in overall symptoms and/or function to demonstrate improved functional mobility Baseline: 0% better Goal status: INITIAL  3.  Patient will be able to sleep through the night and not wake up due to right ankle pain Baseline: Waking up due to pain Goal status: INITIAL     LONG TERM GOALS: Target date: 11/14/23  Patient will report at least 75% improvement in overall symptoms and/or function to demonstrate improved functional mobility Baseline: 0% better Goal status: INITIAL  2.  Patient will improve score on FOTO outcomes measure to projected score to demonstrate overall improved function and QOL Baseline: see above Goal status: INITIAL  3.  Patient will demonstrate within 0.5 cm of swelling difference between right and left lower leg and ankle. Baseline: See above Goal status: INITIAL  4.  Patient will be able to confidently walk for 15 to 20 minutes without pain following to improve mobility and community. Baseline: Unable painful afterwards Goal status: INITIAL    PLAN:  PT FREQUENCY: 1-2x/week For total of 12 visits over 12 weeks certification period  PT DURATION: 12 weeks  PLANNED INTERVENTIONS: Therapeutic exercises, Therapeutic activity, Neuromuscular re-education, Balance training, Gait training, Patient/Family education, Self Care, Joint mobilization, Joint manipulation, Stair training, Vestibular training, Canalith repositioning, Orthotic/Fit training, Prosthetic training, DME instructions, Aquatic Therapy, Dry Needling, Electrical stimulation, Spinal manipulation,  Spinal mobilization, Cryotherapy, Moist heat, Taping, Traction, Ultrasound, Ionotophoresis 4mg /ml Dexamethasone, Manual therapy, and Re-evaluation.   PLAN FOR NEXT SESSION: f/u KT tape, hip MMT, calcaneal eversion  Edema mobilization, follow-up  with compression garments, lateral ankle strengthening progressed towards standing exercises once tolerated and balance to work on frontal plane instability and weakness   3:02 PM, 09/11/23 Tereasa Coop, DPT Physical Therapy with [facility]

## 2023-09-11 NOTE — Progress Notes (Signed)
Specialty Pharmacy Initiation Note   Lindsey Knight is a 62 y.o. female who will be followed by the specialty pharmacy service for RxSp Allergy    Review of administration, indication, effectiveness, safety, potential side effects, and storage/disposable occurred today for patient's specialty medication(s) Dupilumab     Patient/Caregiver did not have any additional questions or concerns.   Patient's therapy is appropriate to: Initiate  Patient will have first injection in clinic, then transition to home administration. Patient is a retired Engineer, civil (consulting).    Goals Addressed             This Visit's Progress    Minimize recurrence of flares       Patient is initiating therapy. Patient will maintain adherence         Bobette Mo Specialty Pharmacist

## 2023-09-11 NOTE — Telephone Encounter (Signed)
-----   Message from Nehemiah Settle sent at 08/31/2023  1:51 PM EDT ----- She is interested in starting Dupixent for nasal polyps

## 2023-09-11 NOTE — Telephone Encounter (Signed)
Called patient and advised approval, copay card and submit to [facility] long. Patient will come in for initial injection then take remaining injection home for next dose.

## 2023-09-12 ENCOUNTER — Other Ambulatory Visit: Payer: Self-pay

## 2023-09-13 ENCOUNTER — Ambulatory Visit: Payer: BC Managed Care – PPO

## 2023-09-13 DIAGNOSIS — J339 Nasal polyp, unspecified: Secondary | ICD-10-CM

## 2023-09-13 MED ORDER — DUPILUMAB 300 MG/2ML ~~LOC~~ SOSY
300.0000 mg | PREFILLED_SYRINGE | Freq: Once | SUBCUTANEOUS | Status: AC
Start: 2023-09-13 — End: 2023-09-13
  Administered 2023-09-13: 300 mg via SUBCUTANEOUS

## 2023-09-13 NOTE — Progress Notes (Signed)
Immunotherapy   Patient Details  Name: Jillane Lemasters MRN: [id number] Date of Birth: 04-29-1961  09/13/2023  Kalman Jewels started injections for  Dupixent   Frequency: Every 2 Weeks Epi-Pen: Not Required  Consent signed and patient instructions given. Patient started Dupixent and self injection 2mL in the Left lower abdomen. Patient waited 30 minutes in office and did not experience any issues. Patient will continue to self administer at home every 2 weeks.    Czar Ysaguirre Fernandez-Vernon 09/13/2023, 10:48 AM

## 2023-09-14 ENCOUNTER — Other Ambulatory Visit: Payer: Self-pay

## 2023-09-17 ENCOUNTER — Other Ambulatory Visit (INDEPENDENT_AMBULATORY_CARE_PROVIDER_SITE_OTHER): Payer: Self-pay | Admitting: Otolaryngology

## 2023-09-17 ENCOUNTER — Encounter: Payer: Self-pay | Admitting: Allergy

## 2023-09-20 ENCOUNTER — Ambulatory Visit: Payer: BC Managed Care – PPO | Admitting: Physical Therapy

## 2023-09-20 ENCOUNTER — Encounter: Payer: Self-pay | Admitting: Physical Therapy

## 2023-09-20 DIAGNOSIS — M6281 Muscle weakness (generalized): Secondary | ICD-10-CM

## 2023-09-20 DIAGNOSIS — R262 Difficulty in walking, not elsewhere classified: Secondary | ICD-10-CM | POA: Diagnosis not present

## 2023-09-20 DIAGNOSIS — M25571 Pain in right ankle and joints of right foot: Secondary | ICD-10-CM | POA: Diagnosis not present

## 2023-09-20 NOTE — Therapy (Addendum)
OUTPATIENT PHYSICAL THERAPY LOWER EXTREMITY TREATMENT PHYSICAL THERAPY DISCHARGE SUMMARY  Visits from Start of Care: 4  Current functional level related to goals / functional outcomes: Could not Reassess due to unplanned Discharge   Remaining deficits: Could not Reassess due to unplanned Discharge   Education / Equipment: Could not Reassess due to unplanned Discharge   Patient agrees to discharge. Patient goals were not met. Patient is being discharged due to not returning since the last visit.  10:25 AM, 10/31/23 Tereasa Coop, DPT Physical Therapy with [facility]    Patient Name: Jalynne Kaye MRN: [id number] DOB:1961/04/09, 62 y.o., female, female Today's Date: 09/20/2023  END OF SESSION:  PT End of Session - 09/20/23 1211     Visit Number 4    Number of Visits 12    Date for PT Re-Evaluation 11/14/23    Authorization Type $120 copay and VL 30 with 29 left    Authorization - Visit Number 4    Authorization - Number of Visits 29    PT Start Time 1216    PT Stop Time 1255    PT Time Calculation (min) 39 min    Activity Tolerance Patient tolerated treatment well    Behavior During Therapy WFL for tasks assessed/performed             Past Medical History:  Diagnosis Date   Allergy Child   Anemia    Arthritis    Asthma    Eczema    GERD (gastroesophageal reflux disease) 10/22   Hiatal hernia    Osteoporosis    Ulcer 6/23   Vaginal delivery    x 2, 1985, 1990   Past Surgical History:  Procedure Laterality Date   COLONOSCOPY  01/23/2022   normal - 2012   LIPOMA EXCISION Right 03/27/2023   back   NASAL SINUS SURGERY Bilateral 2015   OTHER SURGICAL HISTORY     lipoma removed from back 03/2023   Patient Active Problem List   Diagnosis Date Noted   Trigger thumb, right thumb 07/13/2022   Colon cancer (HCC) 01/03/2022   Crohn's disease (HCC) 12/30/2021    PCP: Jarold Motto, PA  REFERRING PROVIDER: Rodolph Bong, MD   REFERRING DIAG: M77.51  (ICD-10-CM) - Tendinitis of right ankle  THERAPY DIAG:  Pain in right ankle and joints of right foot  Muscle weakness (generalized)  Difficulty in walking, not elsewhere classified  Rationale for Evaluation and Treatment: Rehabilitation  ONSET DATE: July 2024- no known MOI  SUBJECTIVE:   SUBJECTIVE STATEMENT: 09/20/2023 States she has been a little unwell and has lost her voice. States she still has difficulties talking. States that she turned the ankle on Saturday while going down stairs. States that caused more pain. States she has sharp pain with twisting her foot but no pain at rest.    EVAL: States she had a lot of right ankle pain and she doesn't know what she did. States she has a partial tear per MD and she tried a compression brace and nitroglycerin patches. States she had a rash and stopped using the patches. States she has been hesitant about walking as she has pain afterwards but not during.  Reports she has also had difficulty wearing shoes as the pain is right where her shoes touch and has been wearing sandals a lot.  Has been having a lot of difficulty sleeping at night as she tosses and turns and when she does this her ankle moves in a way that wakes her up.  Reports she is on 3 rounds of steroids due to an ENT issue and does not know if this is some due to her helped her symptoms at all.  Reports overall the swelling is better but continues to have swelling and pain along the outside of her ankle.    PERTINENT HISTORY: N/A PAIN:  Are you having pain? Yes: NPRS scale: 0/10 Pain location: right lateral ankle Pain description: sharp Aggravating factors: shoes, turn, sleep tossing and turning Relieving factors: rest  PRECAUTIONS: None  RED FLAGS: None   WEIGHT BEARING RESTRICTIONS: No  FALLS:  Has patient fallen in last 6 months? Yes. Number of falls 2- muddy yard - rushing in garden    OCCUPATION: retired Engineer, civil (consulting)   PLOF: Independent likes to walk   PATIENT  GOALS: to be able to walk and sleep better    OBJECTIVE:   DIAGNOSTIC FINDINGS: right ankle xray 08/09/23 awaiting results  PATIENT SURVEYS:  FOTO 60%   COGNITION: Overall cognitive status: Within functional limits for tasks assessed     SENSATION: Not tested  EDEMA:  Circumferential:    At ankle L 24.0cm, R 24.75cm Figure 8: 49.5cm L, 51.0cm R  Mid calf circumferential R 39.0 cm L 38.0  POSTURE: rounded shoulders, forward head, and posterior pelvic tilt  PALPATION: Tenderness to palpation along right lateral ankle and medial and lateral lower leg on right side.  Swelling noted throughout right lower leg and tightness in this area    LE Measurements Lower Extremity Right EVAL Left EVAL   A/PROM MMT A/PROM MMT  Hip Flexion      Hip Extension      Hip Abduction  3  4-  Hip Adduction      Hip Internal rotation      Hip External rotation      Knee Flexion      Knee Extension      Ankle Dorsiflexion knee bent/straight 5/10 4** 10/10 5  Ankle Plantarflexion 45 Unable painful 55 20 SL heel raises  Ankle Inversion 30 (rests in 20) 3+* 30 (rests in 10) 4+  Ankle Eversion 0 3+ 0 4+   (Blank rows = not tested) * pain **favors extensor digitorum     GAIT: Distance walked: 25 feet within clinic Assistive device utilized: None Level of assistance: Complete Independence Comments: Reduced weightbearing on right lower extremity in stance phase, ankle and excessive inversion right foot reduced toe off   TODAY'S TREATMENT:                                                                                                                              DATE:   09/20/2023  Therapeutic Exercise: Review of entire home exercise program: Supine: Prone:  Seated:  heel raises x20 B  Standing:-  tandem on floor, x2 30" holds B, tandem on floor  x3 30" holds B, heel raises x20 slow and controlled Neuromuscular Re-education: Manual Therapy: IASTM to right anterior  lower leg -  tolerated well (with metal tool. Percussion gun for vibration to right heel - tolerated well less pain afterwards Therapeutic Activity: Self Care: Trigger Point Dry Needling:  Modalities:    PATIENT EDUCATION:  Education details: on acute healing phase, on things to try to reduce pain Person educated: Patient Education method: Explanation, Demonstration, and Handouts Education comprehension: verbalized understanding   HOME EXERCISE PROGRAM: [id number]  ASSESSMENT:  CLINICAL IMPRESSION: 09/20/2023 Focused on reducing pain from recent twist coming down the stairs.  Tolerated vibration and instrument work well.  Reduced pain noted end of session.  Added close chain balance exercise instead of active eversion which was painful.  This was tolerated better.  Will continue to follow-up with patient as needed overall patient is doing very well.  EVAL:Patient presents to physical therapy with complaints of right ankle pain that started about 6 months ago and has improved but continues to bother her with functional activity and sleeping at night.  Patient presents with weakness, range of motion limitations as well as continued swelling along right ankle despite repetitive use of steroids over the last few months due to an ENT issue.  Educated patient current presentation as well as plan moving forward.  Focused on managing swelling in right lower extremity secondary to this likely causing increased pain at night.  Answered all questions and patient would greatly benefit from skilled PT to improve overall function and quality of life.  OBJECTIVE IMPAIRMENTS: decreased activity tolerance, difficulty walking, decreased ROM, decreased strength, improper body mechanics, and pain.   ACTIVITY LIMITATIONS: sleeping, stairs, and locomotion level  PARTICIPATION LIMITATIONS: community activity  PERSONAL FACTORS: Time since onset of injury/illness/exacerbation are also affecting patient's functional outcome.    REHAB POTENTIAL: Good  CLINICAL DECISION MAKING: Stable/uncomplicated  EVALUATION COMPLEXITY: Low   GOALS: Goals reviewed with patient? yes  SHORT TERM GOALS: Target date: 09/19/2023   Patient will be independent in self management strategies to improve quality of life and functional outcomes. Baseline: New Program Goal status: INITIAL  2.  Patient will report at least 50% improvement in overall symptoms and/or function to demonstrate improved functional mobility Baseline: 0% better Goal status: INITIAL  3.  Patient will be able to sleep through the night and not wake up due to right ankle pain Baseline: Waking up due to pain Goal status: INITIAL     LONG TERM GOALS: Target date: 11/14/23  Patient will report at least 75% improvement in overall symptoms and/or function to demonstrate improved functional mobility Baseline: 0% better Goal status: INITIAL  2.  Patient will improve score on FOTO outcomes measure to projected score to demonstrate overall improved function and QOL Baseline: see above Goal status: INITIAL  3.  Patient will demonstrate within 0.5 cm of swelling difference between right and left lower leg and ankle. Baseline: See above Goal status: INITIAL  4.  Patient will be able to confidently walk for 15 to 20 minutes without pain following to improve mobility and community. Baseline: Unable painful afterwards Goal status: INITIAL    PLAN:  PT FREQUENCY: 1-2x/week For total of 12 visits over 12 weeks certification period  PT DURATION: 12 weeks  PLANNED INTERVENTIONS: Therapeutic exercises, Therapeutic activity, Neuromuscular re-education, Balance training, Gait training, Patient/Family education, Self Care, Joint mobilization, Joint manipulation, Stair training, Vestibular training, Canalith repositioning, Orthotic/Fit training, Prosthetic training, DME instructions, Aquatic Therapy, Dry Needling, Electrical stimulation, Spinal manipulation, Spinal  mobilization, Cryotherapy, Moist heat, Taping, Traction, Ultrasound, Ionotophoresis 4mg /ml Dexamethasone, Manual therapy, and Re-evaluation.  PLAN FOR NEXT SESSION: f/u KT tape, hip MMT, calcaneal eversion  Edema mobilization, follow-up with compression garments, lateral ankle strengthening progressed towards standing exercises once tolerated and balance to work on frontal plane instability and weakness   1:00 PM, 09/20/23 Tereasa Coop, DPT Physical Therapy with [facility]

## 2023-09-24 ENCOUNTER — Other Ambulatory Visit (HOSPITAL_COMMUNITY): Payer: Self-pay

## 2023-10-02 ENCOUNTER — Encounter: Payer: BC Managed Care – PPO | Admitting: Physician Assistant

## 2023-10-02 ENCOUNTER — Other Ambulatory Visit: Payer: Self-pay

## 2023-10-02 NOTE — Progress Notes (Signed)
Specialty Pharmacy Refill Coordination Note  Lindsey Knight is a 62 y.o. female contacted today regarding refills of specialty medication(s) Dupilumab   Patient requested [name] at [facility] at [place] date: 10/08/23   Medication will be filled on 10/05/23.

## 2023-10-05 ENCOUNTER — Other Ambulatory Visit: Payer: Self-pay

## 2023-10-12 ENCOUNTER — Encounter (HOSPITAL_BASED_OUTPATIENT_CLINIC_OR_DEPARTMENT_OTHER): Payer: Self-pay | Admitting: *Deleted

## 2023-10-15 ENCOUNTER — Ambulatory Visit (INDEPENDENT_AMBULATORY_CARE_PROVIDER_SITE_OTHER): Payer: BC Managed Care – PPO | Admitting: Otolaryngology

## 2023-10-15 ENCOUNTER — Encounter (INDEPENDENT_AMBULATORY_CARE_PROVIDER_SITE_OTHER): Payer: Self-pay | Admitting: Otolaryngology

## 2023-10-15 VITALS — BP 152/73 | HR 86

## 2023-10-15 DIAGNOSIS — R490 Dysphonia: Secondary | ICD-10-CM | POA: Diagnosis not present

## 2023-10-15 DIAGNOSIS — J329 Chronic sinusitis, unspecified: Secondary | ICD-10-CM | POA: Diagnosis not present

## 2023-10-15 DIAGNOSIS — J3089 Other allergic rhinitis: Secondary | ICD-10-CM

## 2023-10-15 DIAGNOSIS — J339 Nasal polyp, unspecified: Secondary | ICD-10-CM

## 2023-10-15 DIAGNOSIS — J343 Hypertrophy of nasal turbinates: Secondary | ICD-10-CM

## 2023-10-15 DIAGNOSIS — J342 Deviated nasal septum: Secondary | ICD-10-CM

## 2023-10-15 DIAGNOSIS — K219 Gastro-esophageal reflux disease without esophagitis: Secondary | ICD-10-CM | POA: Diagnosis not present

## 2023-10-15 DIAGNOSIS — R0981 Nasal congestion: Secondary | ICD-10-CM | POA: Diagnosis not present

## 2023-10-15 DIAGNOSIS — J324 Chronic pansinusitis: Secondary | ICD-10-CM

## 2023-10-15 NOTE — Patient Instructions (Signed)
Sinus Surgery Post-Operative Care Instructions  What are the sinuses?  The sinuses are air filled cavities (or holes) in the skull that are located adjacent to the nose. The tissue (mucosa) that lines these cavities and the nose swells and secretes mucus in response to infection or environmental irritants. Normally, the mucus produced by the sinuses drains into the nose and is, then, either swallowed or coughed up. With infection, the swelling of the sinus mucosa can make drainage difficult, leading to chronic, recurrent infections.  The triad of nasal congestion, facial discomfort and discolored nasal drainage most frequently defines chronic sinusitis.  There are four pairs of sinuses. (See figure below) 1) Maxillary Sinuses (cheek sinuses) 2) Ethmoid Sinuses (the sinuses located between the eyes)  3) Sphenoid Sinuses (the sinuses located behind the nose) 4) Frontal Sinuses (the sinuses located above the eyes)    How is sinusitis treated?  Antibiotics, steroids, nasal sprays, and decongestants are often successful in treating short-term bouts of sinusitis. When medications fail to provide adequate relief from sinusitis, surgery must be considered.  What is sinus surgery?  1) The goal of sinus surgery is to enlarge the natural openings of the sinuses into the nose. Enlarging these openings makes it easier for the sinuses to drain, even when swollen from infection or environmental irritants. Sinus surgery is also used to remove nasal polyps, nasal masses, and, sometimes, to straighten the nasal septum.  2) Using small cameras with lights on the end (endoscopes) the surgery is performed through the nose, without the need for any external incisions. In addition to the use of endoscopes, special instruments have been designed to perform the task of removing thickened and diseased tissue from the opening of these sinuses.  3) Sinus surgery is generally an outpatient procedure, lasting from one to four  hours. Although nasal packing is no longer common, "spacers" are used to aid proper healing of the sinus mucosa. The spacers will be removed during your first post-operative appointment.   4) Recovery time varies from patient to patient, but, in general, usually lasts between one to two weeks. Initially, patients should expect to feel congested and some mild sinus pressure. As the sinuses slowly heal, this congestion and pressure will decrease.  5) It is important to remember that, while we perform the surgery, you play an active role in the success of its outcome. It is up to you to abide by the postoperative restrictions and implement the postoperative care instructions.   The Do's and Do Not's of postoperative sinus care:  DO: DO take the pain medication prescribed: ____________ every ___ hours as needed for pain. You may also use Tylenol for breakthrough pain or by itself (if it is sufficient to control your pain). DO take the antibiotics prescribed: ____________ - ___ times a day for ______ weeks/days. DO take the steroid prescribed: ____________ - daily as directed. Continue taking the steroid until you are instructed by your surgeon to stop. DO take live cultures while on the antibiotic: acidophilus/yogurt daily DO start your nasal irrigations the day after surgery.  These irrigations must be performed at least three times a day (more is preferable), however it does not hurt to perform them more frequently. This is essential to the healing process. It removes the crusts that form as the nasal tissue heals and prevents scarring within the nose. Please see the following page for specific instructions. DO cough and sneeze with your mouth open. DO eat a regular diet. DO take your pain  medication before your first postoperative appointment.  DO NOT: DO NOT perform any heavy lifting (nothing greater than 15lbs), bending, straining. DO NOT blow your nose or pick at your nose for at least 2  weeks DO NOT take aspirin or aspirin containing medications (Advil, Motrin, or any other NSAIDS) DO NOT fly without your doctor's clearance for 3-5 days after surgery DO NOT stop your prednisone until directed to do so by your surgeon.  Call Your Doctor Immediately If: Change in vision Increased swelling around the eyes Neck stiffness or deep head pain Continued Nausea or Vomiting Bright red blood that lasts more than ten minutes or causes choking Fever over 101 degrees     NASAL/SINUS IRRIGATIONS  It is required that you wash out your nose and sinus cavities with a saline solution. This is good in the post-operative period to flush out pus, crusts, and debris. In the long-term, this is also used to mechanically wash out infections. You can use the recipe below to make the irrigation solution or the packets that come with the [facility] Sinus Rinse squeeze bottle (see [facility] box instructions).  RECIPE:  1 quart boiled or distilled H2O  1 teaspoon canning/pickling/kosher salt (non-iodized)  1 teaspoon baking soda  Irrigate each nostril with 4oz [facility] squeeze bottle contains 8oz) of the above solution at least three times daily. While in the shower or leaning over a sink, aim the squeeze bottle (see figure 2) diagonally (away from the septum). The fluid will circulate in and out of your sinus cavities, coming back out the opposite nostril being irrigated. To accomplish this focus on making a "k" sound while you irrigate. This will close your palate so the irrigation does not wash out your mouth. The irrigations help to clean the clots from your nose and prevent scarring after surgery.   To view a video demonstration, please go to [email or web address]. Once on their home page, click the link on the left side of the screen reading, "[facility] Videos."  It may be convenient to mix larger quantities of the saline solution and store it in your refrigerator, warming up each days supply prior to  use. Consider buying one gallon of distilled water and adding 4 tsp of salt and 4 tsp of baking soda.

## 2023-10-15 NOTE — H&P (View-Only) (Signed)
 ENT Progress Note  Update 10/15/23:  Discussed the use of AI scribe software for clinical note transcription with the patient, who gave verbal consent to proceed.  History of Present Illness   The patient, with a history of chronic sinusitis and nasal polyps, presents for a preoperative visit. They have been experiencing persistent nasal congestion and postnasal drip, which they describe as a "gob of stuff" that they cough up in the mornings. The patient has been using budesonide nasal rinses, which have been somewhat effective in clearing the nasal passages. However, they have also noticed some nasal bleeding, which they attribute to the steroid rinses.  The patient has recently started on Dupixent, with three injections administered so far. Following the first injection, they experienced a week-long loss of voice, which they initially attributed to the medication. However, they now believe it may have been due to a concurrent viral infection, as similar symptoms were experienced by family members.  The patient also reports a history of reflux, for which they take Pepcid. They have noticed some improvement in their reflux symptoms with this medication. They also have a known hiatal hernia, which they believe may be contributing to their reflux symptoms.  The patient has previously undergone sinus surgery, which provided some relief from their chronic sinusitis symptoms. However, they continue to experience nasal congestion and postnasal drip, and have been noted to have persistent nasal polyps on examination.       Update 09/05/23 She returns for follow-up after imaging and labs. Started budesonide rinses and on daily antihistamine. Saw allergy and was tested for allergies. Dupixent was recommended. Insurance pre-approval for Dupixent is pending. She completed sinus CT. She feels somewhat better currently. No acute sinus infections since last office visit.   Initial Evaluation:  Reason for  Consult: chronic headaches and sinusitis on CT max/face    HPI: Lindsey Knight is an 62 y.o. female with hx of chronic nasal congestion, frequent sinus infections for several decades and chronic headaches, here for initial evaluation of her sx. She has a longstanding history of sinus infections and allergy symptoms dating back several decades ago.  She received allergy shots for years in [place], moved to [facility] and allergy testing was negative, then moved to [place], again allergy skin testing was negative per report . she then started to have recurrent sinus infections, required multiple courses of antibiotics and steroids. She then had sinus surgery 13 yrs ago, and after sinus surgery her sinus infections became less frequent.  She reports that her sinus surgeon did IgE levels at the time and IgA levels were low.  She is on Zyrtec daily and uses Atrovent spray and Flonase nasal spray BID. At night she takes Singulair. She uses albuterol when her symptoms worsen. She took abx and steroids after her most recent bout of sinus infection and currently feeling better.  Denies history of allergies to aspirin or NSAIDs.  She had CT max face/MRI brain ordered due to persistent headache. MRI brain was concerning for potential defect at cribriform plate and bilateral sinusitis.  CT max face was ordered in follow-up and it did not show a defect at the skull base, but demonstrated bilateral sinusitis involving maxillary and ethmoid sinuses and evidence of prior sinus surgery.  Records Reviewed:  Office visit with Dr Derek Jack 05/18/23 Han Milbauer is a 62 year old woman, never smoker with history of asthma who returns to pulmonary clinic for cough.    She reports sinus drainage with post nasal drip  and some sinus pressure over recent weeks. She has significant cough that can keep her up at night. She is using advair 250-85mcg 1 puff twice daily and as needed albuterol. She is using flonase 1 spray per nostril daily. She  continues to have reflux symptoms despite famotidine 20mg  twice daily.    OV 10/09/22 She reports increased cough over the past 2 months. She describes an event where she was bending over in the community garden and possibly had a significant reflux episode. She denies any viral illnesses recently.    She saw GI 08/01/22 with plan to reduce PPI dosing then transition to famotidine for 2 weeks then to use as needed. She reports the famotidine did not help, she reports burning sensation in her chest and throat. She then started omeprazole OTC which seems to have reduced her reflux symptoms.    She has advair 250-65mcg 1 puff twice daily that she has been using as needed. She has albuterol as needed as well. She has not felt much relief.    She has not been on steroids or antibiotics.    OV 03/06/22 Chest x-ray today shows clearing of the right upper lobe infiltrates. Will await final radiology read.    She is using advair 100-50 mcg 1 puff twice daily as needed along with daily allegra and montelukast for her asthma. No night time awakenings. No issues with spring allergies.   She has been feeling well since last visit. He cough has significantly decreased. She recently had EGD and C-scope and started on PPI therapy for GERD.    OV 01/03/22 Patient reports having respiratory illness in mid-December 2022 and was treated with Zpak on 12/13. PCP note from 11/08/2021 reviewed. Chest x-ray on 12/30 showed increased lung markings with mild right mid lung and left basilar linear scaring or atelectasis. She was also noted to have increased platelet count. She had CT Chest on 11/29/21 that showed mediastinal, axillary and hilar lymphadenopathy along with patchy ground-glass and adjacent focal clustered nodularity in the right upper lobe and adjacent band like atelectasis/scarring at the junction of the right upper lobe.    Patient reports resolution of her infectious symptoms from December and is feeling at  her baseline. She will occasionally have some right sided discomfort but it is overall improved from December. She notices the pain when laying down or moving when she sleeps. She denies any shortness of breath, fevers, chills or night sweats. She denies hemoptysis. She does have joint aches in general which include her knees, hips and occasional hand stiffness in the mornings.   She has history of asthma and mainly has issues with reactive airways disease when triggered by viral infections and uses inhalers as needed. She denies issues with seasonal allergies. She had sinus surgery in the past with significant reduction in frequency of sinus infections.    She is a never smoker. She is a retired Engineer, civil (consulting). She has a sister with crohn's disease and her mother had colon cancer.        Office note by Jarold Motto PA Pt c/o headache x 1 week on right side of head behind ear. Took 2 extra strength Tylenol last night with relief. Has pain now  Headache She has been having an intermittent headache since 06/13/23.  She is taking extra strength Tylenol morning and evening with some relief. Took Sudafed two days ago without relief. Pain is located behind right ear. Rates pain 7/10 and describes as sharp throbbing pain. No  change in lifestyle, eating, stress. Denies any recent head trauma. This kind of headache is unusual for her. She is not a "headache" person. Treated recently for maxillary sinusitis on 05/18/23.   Feels somewhat disoriented, especially on the right side of her vision field. 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 She has been taking alendronate for one year - unsure if this is contributing to her symptom(s).    No family history of stroke. Denies chest pain, shortness of breath. No change in bedding, pillows. Has not had her yearly eye exam for this year.   Denies rash. Had bilateral nasal sinus surgery in 2015.  Denies sinus pressure, flu symptoms.    New daily persistent headache Neuro exam is wnl No red flags on my exam Will obtain MR without contrast to further evaluate given persistence and age >70 If new/worsening symptom(s), likely will need to go to the ER Consider neurology referral    Past Medical History:  Diagnosis Date   Allergy Child   Anemia    Arthritis    Asthma    Eczema    GERD (gastroesophageal reflux disease) 10/22   Hiatal hernia    Osteoporosis    Ulcer 6/23   Vaginal delivery    x 2, 1985, 1990    Past Surgical History:  Procedure Laterality Date   COLONOSCOPY  01/23/2022   normal - 2012   LIPOMA EXCISION Right 03/27/2023   back   NASAL SINUS SURGERY Bilateral 2015   OTHER SURGICAL HISTORY     lipoma removed from back 03/2023    Family History  Problem Relation Age of Onset   Colon cancer Mother    Cancer Mother    Hearing loss Mother    Heart failure Father    Asthma Father    COPD Father    Hearing loss Father    Crohn's disease Sister    Pancreatic cancer Other    Ovarian cancer Other    Uterine cancer Other    Allergic rhinitis Grandson    Food Allergy Grandson    Asthma Daughter     Social History:  reports that she has never smoked. She has never been exposed to tobacco smoke. She has never used smokeless tobacco. She reports that she does not currently use alcohol. She reports that she does not currently use drugs after having used the following drugs: Marijuana.  Allergies:  Allergies  Allergen Reactions   Other Hives    Adhesives   Tape Rash    Medications: I have reviewed the patient's current medications.  The PMH, PSH, Medications, Allergies, and SH were reviewed and updated.  ROS: Constitutional: Negative for fever, weight loss and weight gain. Cardiovascular: Negative for chest pain and dyspnea on exertion. Respiratory: Is not experiencing shortness of breath at rest. Gastrointestinal: Negative for nausea and vomiting. Neurological: Negative for  headaches. Psychiatric: The patient is not nervous/anxious  Blood pressure (!) 152/73, pulse 86, SpO2 96%.  PHYSICAL EXAM:  Exam: PHYSICAL EXAM:  Exam: General: Well-developed, well-nourished Communication and Voice: Clear pitch and clarity Respiratory Respiratory effort: Equal inspiration and expiration without stridor Cardiovascular Peripheral Vascular: Warm extremities with equal color/perfusion Eyes: No nystagmus with equal extraocular motion bilaterally Neuro/Psych/Balance: Patient oriented to person, place, and time; Appropriate mood and affect; Gait is intact with no imbalance; Cranial nerves I-XII are intact Head and Face Inspection: Normocephalic and atraumatic without mass or lesion Palpation: Facial skeleton intact without bony stepoffs Salivary Glands: No mass or  tenderness Facial Strength: Facial motility symmetric and full bilaterally ENT Pinna: External ear intact and fully developed External canal: Canal is patent with intact skin Tympanic Membrane: Clear and mobile External Nose: No scar or anatomic deformity Internal Nose: Septum is deviated to the left. (+) bilateral nasal polyps, no purulence. Mucosal edema and erythema present.  Bilateral inferior turbinate hypertrophy present.  Lips, Teeth, and gums: Mucosa and teeth intact and viable TMJ: No pain to palpation with full mobility Oral cavity/oropharynx: No erythema or exudate, no lesions present Nasopharynx: No mass or lesion with intact mucosa Hypopharynx: Intact mucosa without pooling of secretions Larynx Glottic: Full true vocal cord mobility without lesion or mass Supraglottic: Normal appearing epiglottis and AE folds Interarytenoid Space: Moderate pachydermia/edema Subglottic Space: Patent without lesion or edema Neck Neck and Trachea: Midline trachea without mass or lesion Thyroid: No mass or nodularity Lymphatics: No lymphadenopathy  Procedure:  Preoperative diagnosis: dysphonia and voice  changes/fluctuations/hoarseness  Postoperative diagnosis:   Same + GERD LPR  Procedure: Flexible fiberoptic laryngoscopy  Surgeon: Ashok Croon, MD  Anesthesia: Topical lidocaine and Afrin Complications: None Condition is stable throughout exam  Indications and consent:  The patient presents to the clinic with Indirect laryngoscopy view was incomplete. Thus it was recommended that they undergo a flexible fiberoptic laryngoscopy. All of the risks, benefits, and potential complications were reviewed with the patient preoperatively and verbal informed consent was obtained.  Procedure: The patient was seated upright in the clinic. Topical lidocaine and Afrin were applied to the nasal cavity. After adequate anesthesia had occurred, I then proceeded to pass the flexible telescope into the nasal cavity. The nasal cavity was patent without rhinorrhea or polyp. The nasopharynx was also patent without mass or lesion. The base of tongue was visualized and was normal. There were no signs of pooling of secretions in the piriform sinuses. The true vocal folds were mobile bilaterally. There were no signs of glottic or supraglottic mucosal lesion or mass. There was moderate interarytenoid pachydermia and post cricoid edema. The telescope was then slowly withdrawn and the patient tolerated the procedure throughout.    PROCEDURE NOTE: nasal endoscopy  Preoperative diagnosis: chronic sinusitis symptoms hx of nasal polyps and FESS  Postoperative diagnosis: same  Procedure: Diagnostic nasal endoscopy (16109)  Surgeon: Ashok Croon, M.D.  Anesthesia: Topical lidocaine and Afrin  H&P REVIEW: The patient's history and physical were reviewed today prior to procedure. All medications were reviewed and updated as well. Complications: None Condition is stable throughout exam Indications and consent: The patient presents with symptoms of chronic sinusitis not responding to previous therapies. All the  risks, benefits, and potential complications were reviewed with the patient preoperatively and informed consent was obtained. The time out was completed with confirmation of the correct procedure.   Procedure: The patient was seated upright in the clinic. Topical lidocaine and Afrin were applied to the nasal cavity. After adequate anesthesia had occurred, the rigid nasal endoscope was passed into the nasal cavity. The nasal mucosa, turbinates, septum, and sinus drainage pathways were visualized bilaterally. This revealed no purulence or significant secretions that might be cultured. There were bilateral nasal polyps. The mucosa was intact and there was no crusting present. The scope was then slowly withdrawn and the patient tolerated the procedure well. There were no complications or blood loss.    Studies Reviewed:CT max/face CLINICAL DATA:  Sinus mass on recent MRI.   EXAM: CT sinuses 08/22/23   FINDINGS: Paranasal sinuses:   Frontal: Persistent, near complete opacification  bilaterally with associated chronic osteitis.   Ethmoid: Moderate residual air cell opacification bilaterally, predominantly involving anterior air cells and overall greatly improved from prior (particularly posteriorly).   Maxillary: Prominent circumferential, mildly nodular/polypoid mucosal thickening bilaterally with associated partial mineralization of sinus contents and with chronic osteitis. Improved aeration from prior.   Sphenoid: Minimal mucosal thickening and possible trace fluid bilaterally, improved from prior.   Right ostiomeatal unit: Patent maxillary antrostomy.   Left ostiomeatal unit: Patent maxillary antrostomy.   Nasal passages: Bilateral middle turbinate concha bullosa. Residual mucosal thickening primarily in the anterosuperior aspect of the nasal cavity, improved from prior. Essentially midline nasal septum.   Anatomy: No pneumatization superior to anterior ethmoid notches. Keros II  with the left fovea ethmoidalis again noted to be higher than on the right. Sellar sphenoid pneumatization pattern. No dehiscence of carotid or optic canals. No onodi cell.   Other: Clear mastoid air cells and middle ear cavities. Unremarkable appearance of the orbits and included portion of the brain.   IMPRESSION: Overall improved pansinusitis although with persistent near complete opacification of the frontal sinuses.  CT MAXILLOFACIAL WITH CONTRAST 07/17/23   TECHNIQUE: Multidetector CT imaging of the maxillofacial structures was performed with intravenous contrast. Multiplanar CT image reconstructions were also generated.   RADIATION DOSE REDUCTION: This exam was performed according to the departmental dose-optimization program which includes automated exposure control, adjustment of the mA and/or kV according to patient size and/or use of iterative reconstruction technique.   CONTRAST:  75mL OMNIPAQUE IOHEXOL 300 MG/ML  SOLN   COMPARISON:  Brain MRI 07/10/2023   FINDINGS: Osseous: No fracture or destructive changes   Orbits: Unremarkable   Sinuses: Essentially confluent opacification of maxillary, ethmoid, and frontal sinuses bilaterally with diffuse obstruction of the upper nasal cavity by mucosal thickening. Partially mineralized debris is seen in the maxillary sinuses and extending towards the widened ostia. No clear destructive changes, asymmetry at the left cribriform plate on prior brain MRI is likely due to the higher left fovea ethmoidalis, a normal variant. No invasive features are seen. The nasal septum is nearly midline.   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 No destructive or invasive features.  MRI Brain EXAM: MRI HEAD WITHOUT CONTRAST   TECHNIQUE: Multiplanar, multiecho pulse sequences of the brain and surrounding structures were  obtained without intravenous contrast.   COMPARISON:  None Available.   FINDINGS: Brain: Negative for acute infarct. Small microhemorrhages in the inferomedial right frontal and left frontal lobe. No hydrocephalus. No extra-axial fluid collection. No mass effect. No mass lesion. Sequela of mild chronic microvascular ischemic change.   Vascular: Normal flow voids.   Skull and upper cervical spine: Normal marrow signal.   Sinuses/Orbits: No middle ear or mastoid effusion. There is near-complete opacification of the bilateral maxillary, ethmoid, and frontal sinuses. There is partial opacification of the bilateral sphenoid sinuses. The cribriform plate appears somewhat irregular and there appears to be mass effect on the olfactory sulcus (series 117, image 63). The integrity of the lamina papyracea on the left is also uncertain (series 117, image 56).   Other: None.   IMPRESSION: 1. No acute intracranial abnormality. 2. Near-complete opacification of the bilateral maxillary, ethmoid, and frontal sinuses. The cribriform plate appears somewhat irregular and there appears to be mass effect on the olfactory sulcus. The integrity of the lamina papyracea on the left is also uncertain. Recommend further evaluation with a dedicated contrast-enhanced maxillofacial CT.  Assessment/Plan: Encounter Diagnoses  Name Primary?   Chronic pansinusitis    Hypertrophy of both inferior nasal turbinates    Nasal polyps    Sinusitis with nasal polyps Yes   Environmental and seasonal allergies    Chronic nasal congestion    Deviated nasal septum      Chronic sinusitis with nasal polyps + septal deviation and inferior turbinate hypertrophy - s/p FESS several years ago, with sx recurrence and evidence of severe inflammation bilateral maxillary and ethmoid sinuses -Initially had MRI brain which showed chronic sinus disease and potential irregularity at cribriform plat, but max face CT done following  MRI brain did not reveal any bony defects in the skull base including area of concern along the cribriform plate -I discussed findings of both imaging studies with the patient -Based on my evaluation today including nasal endoscopy she has evidence of polyp regrowth and chronic nasal congestion along with septal deviation inferior turban hypertrophy and purulent secretions along the left middle meatus consistent with chronic rhinosinusitis with nasal polyps Plan  - she had recent CBC with diff, will order total IgE - CT sinuses brainlab protocol - CT max face with fewer images - need for image guidance during surgery  - nasal steroid rinses with [employer] Med nasal saline/mometasone to shrink the polyps and help with sx post-op -  Doxycycline 100 mg BID for sinus infection and Medrol Dose pack (4 mg tab pack, take as directed)  - Allergy referral for consideration to receive biologic such as Dupixent vs Xolair (hx of asthma) - I discussed management of CRSwNP and importance of surgical options and medical management of allergies and chronic inflammation - we discussed risks and benefits of surgery and she would like to proceed - will schedule for revision sinus surgery (bilateral revision FESS, maxillary antrostomy b/l, vs balloon sinuplasty, bilateral total ethmoidectomy, bilateral nasal polypectomy, possible inferior turbinate reduction, possible septoplasty) -She reports history of normal IgA when she had her initial [place], she inquired about the significance of the test result, I advised her to have immunoglobulin levels checked with an allergist, who can better discussed the significance of it if her levels are abnormal  2. Environmental allergies and hx of asthma -Continue Zyrtec 10 mg daily -Stop Flonase after initiation of nasal steroid rinses -Continue Singulair  3. Asthma  -Continue albuterol as needed -Continue Singulair 10 mg daily -See  pulmonary as needed established with Melody Comas, MD   Update 09/05/23 She saw Allergy, and had testing done. She had CT done. She was allergic to aspergillus and a couple of weeds on allergy testing with Dr Selena Batten. Being considered for Dupixent.   Chronic sinusitis with nasal polyps + septal deviation and inferior turbinate hypertrophy - s/p FESS several years ago, with sx recurrence and evidence of severe inflammation bilateral maxillary and ethmoid sinuses on CT max/face a few months ago.  -Initially had MRI brain which showed chronic sinus disease and potential irregularity at cribriform plat, but max face CT done following MRI brain did not reveal any bony defects in the skull base including area of concern along the cribriform plate -I discussed findings of both imaging studies with the patient -Based on nasal endoscopy during initial office visit she has evidence of polyp regrowth and chronic nasal congestion along with septal deviation inferior turbinate hypertrophy and purulent secretions along the left middle meatus consistent with chronic rhinosinusitis with nasal polyps - took abx and steroids and repeat  CT sinuses was done - which showed overall improved paranasal sinus disease with persistent opacification of frontal sinuses partial opacification of maxillary and ethmoid sinuses bilaterally -I reviewed imaging findings with the patient and we will plan for repeat nasal endoscopy in a few weeks prior to her scheduled sinus procedure - She had elevated total IgE   Plan  - continue nasal steroid rinses with [employer]er Med nasal saline/mometasone to shrink the polyps and help with sx post-op - proceed with Dupixent if able to get insurance approval - I discussed management of CRSwNP and importance of surgical options and medical management of allergies and chronic inflammation - we discussed risks and benefits of surgery and she would like to proceed - will plan for revision sinus surgery  (based on imaging results we will plan for bilateral maxillary sinus lavage, removal of the residual ethmoid partitions and frontal balloon sinuplasty and inferior turbinate reduction.  -She reports history of normal IgA when she had her initial work[place], she inquired about the significance of the test result, I advised her to have immunoglobulin levels checked with an allergist, who can better discussed the significance of it if her levels are abnormal  2. Environmental allergies and hx of asthma -Continue Zyrtec 10 mg daily -continue nasal steroid rinses with Budesonide -Continue Singulair - initiate Dupixent if a candidate and approved by insurance  3. Asthma  -Continue albuterol as needed -Continue Singulair 10 mg daily -See pulmonary as needed established with Melody Comas, MD  RTC in a few weeks, will plan repeat nasal endoscopy and will discuss details of planned surgery.  Update 10/15/23 Assessment and Plan    Chronic Sinusitis with Nasal Polyps Chronic sinusitis with nasal polyps, previously treated with surgery. Current symptoms include mucus retention, nasal congestion, and polyps observed on examination/CT scan. 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 Dupixent initiated with three injections so far. Plan for less extensive surgery to remove partitions, wash out maxillary sinuses, and address frontal sinuses. Discussed benefits (improved drainage, reduced symptoms), risks, and recovery expectations (shorter than previous surgery). Shared decision-making regarding procedure and continuation of Dupixent. Discussed postoperative care including sinus precautions and minimal packing. - Proceed with revision FESS  - Continue Dupixent injections - Provide postoperative care instructions including sinus precautions and minimal packing - Schedule follow-up appointment on  November 05, 2023  Dysphonia and sx of Laryngitis after initial Dupixent shot, voice loss for a few days, likely due to concurrent viral infection vs reflux laryngitis with symptoms of voice changes and throat irritation. No evidence of bacterial laryngitis. Currently takes Pepcid. Recommended a natural supplement to complement Pepcid and reduce reflux symptoms. - Continue Pepcid 20 mg daily  - Recommend Reflux Gourmet supplement to be taken after meals and before bedtime - diet and lifestyle changes to minimize reflux   Follow-up - Follow-up appointment on November 05, 2023 postop - Follow-up with allergist on October 31, 2023.    I spent 30 minutes in total face-to-face time and in reviewing records during pre-charting, more than 50% of which was spent in counseling and coordination of care, reviewing test results, reviewing medications and treatment regimen and/or in discussing or reviewing the diagnosis, the prognosis and treatment options. Pertinent laboratory and imaging test results that were available during this visit with the patient were reviewed by me and considered in my medical decision making (see chart for details).  Ashok Croon, MD Otolaryngology [facility] Phone: [phone] Fax: [phone]    10/15/2023, 6:56 PM

## 2023-10-15 NOTE — Progress Notes (Signed)
ENT Progress Note  Update 10/15/23:  Discussed the use of AI scribe software for clinical note transcription with the patient, who gave verbal consent to proceed.  History of Present Illness   The patient, with a history of chronic sinusitis and nasal polyps, presents for a preoperative visit. They have been experiencing persistent nasal congestion and postnasal drip, which they describe as a "gob of stuff" that they cough up in the mornings. The patient has been using budesonide nasal rinses, which have been somewhat effective in clearing the nasal passages. However, they have also noticed some nasal bleeding, which they attribute to the steroid rinses.  The patient has recently started on Dupixent, with three injections administered so far. Following the first injection, they experienced a week-long loss of voice, which they initially attributed to the medication. However, they now believe it may have been due to a concurrent viral infection, as similar symptoms were experienced by family members.  The patient also reports a history of reflux, for which they take Pepcid. They have noticed some improvement in their reflux symptoms with this medication. They also have a known hiatal hernia, which they believe may be contributing to their reflux symptoms.  The patient has previously undergone sinus surgery, which provided some relief from their chronic sinusitis symptoms. However, they continue to experience nasal congestion and postnasal drip, and have been noted to have persistent nasal polyps on examination.       Update 09/05/23 She returns for follow-up after imaging and labs. Started budesonide rinses and on daily antihistamine. Saw allergy and was tested for allergies. Dupixent was recommended. Insurance pre-approval for Dupixent is pending. She completed sinus CT. She feels somewhat better currently. No acute sinus infections since last office visit.   Initial Evaluation:  Reason for  Consult: chronic headaches and sinusitis on CT max/face    HPI: Lindsey Knight is an 62 y.o. female with hx of chronic nasal congestion, frequent sinus infections for several decades and chronic headaches, here for initial evaluation of her sx. She has a longstanding history of sinus infections and allergy symptoms dating back several decades ago.  She received allergy shots for years in [place], moved to [facility] and allergy testing was negative, then moved to [place], again allergy skin testing was negative per report . she then started to have recurrent sinus infections, required multiple courses of antibiotics and steroids. She then had sinus surgery 13 yrs ago, and after sinus surgery her sinus infections became less frequent.  She reports that her sinus surgeon did IgE levels at the time and IgA levels were low.  She is on Zyrtec daily and uses Atrovent spray and Flonase nasal spray BID. At night she takes Singulair. She uses albuterol when her symptoms worsen. She took abx and steroids after her most recent bout of sinus infection and currently feeling better.  Denies history of allergies to aspirin or NSAIDs.  She had CT max face/MRI brain ordered due to persistent headache. MRI brain was concerning for potential defect at cribriform plate and bilateral sinusitis.  CT max face was ordered in follow-up and it did not show a defect at the skull base, but demonstrated bilateral sinusitis involving maxillary and ethmoid sinuses and evidence of prior sinus surgery.  Records Reviewed:  Office visit with Dr Derek Jack 05/18/23 Lindsey Knight is a 62 year old woman, never smoker with history of asthma who returns to pulmonary clinic for cough.    She reports sinus drainage with post nasal drip  and some sinus pressure over recent weeks. She has significant cough that can keep her up at night. She is using advair 250-85mcg 1 puff twice daily and as needed albuterol. She is using flonase 1 spray per nostril daily. She  continues to have reflux symptoms despite famotidine 20mg  twice daily.    OV 10/09/22 She reports increased cough over the past 2 months. She describes an event where she was bending over in the community garden and possibly had a significant reflux episode. She denies any viral illnesses recently.    She saw GI 08/01/22 with plan to reduce PPI dosing then transition to famotidine for 2 weeks then to use as needed. She reports the famotidine did not help, she reports burning sensation in her chest and throat. She then started omeprazole OTC which seems to have reduced her reflux symptoms.    She has advair 250-65mcg 1 puff twice daily that she has been using as needed. She has albuterol as needed as well. She has not felt much relief.    She has not been on steroids or antibiotics.    OV 03/06/22 Chest x-ray today shows clearing of the right upper lobe infiltrates. Will await final radiology read.    She is using advair 100-50 mcg 1 puff twice daily as needed along with daily allegra and montelukast for her asthma. No night time awakenings. No issues with spring allergies.   She has been feeling well since last visit. He cough has significantly decreased. She recently had EGD and C-scope and started on PPI therapy for GERD.    OV 01/03/22 Patient reports having respiratory illness in mid-December 2022 and was treated with Zpak on 12/13. PCP note from 11/08/2021 reviewed. Chest x-ray on 12/30 showed increased lung markings with mild right mid lung and left basilar linear scaring or atelectasis. She was also noted to have increased platelet count. She had CT Chest on 11/29/21 that showed mediastinal, axillary and hilar lymphadenopathy along with patchy ground-glass and adjacent focal clustered nodularity in the right upper lobe and adjacent band like atelectasis/scarring at the junction of the right upper lobe.    Patient reports resolution of her infectious symptoms from December and is feeling at  her baseline. She will occasionally have some right sided discomfort but it is overall improved from December. She notices the pain when laying down or moving when she sleeps. She denies any shortness of breath, fevers, chills or night sweats. She denies hemoptysis. She does have joint aches in general which include her knees, hips and occasional hand stiffness in the mornings.   She has history of asthma and mainly has issues with reactive airways disease when triggered by viral infections and uses inhalers as needed. She denies issues with seasonal allergies. She had sinus surgery in the past with significant reduction in frequency of sinus infections.    She is a never smoker. She is a retired Engineer, civil (consulting). She has a sister with crohn's disease and her mother had colon cancer.        Office note by Jarold Motto PA Pt c/o headache x 1 week on right side of head behind ear. Took 2 extra strength Tylenol last night with relief. Has pain now  Headache She has been having an intermittent headache since 06/13/23.  She is taking extra strength Tylenol morning and evening with some relief. Took Sudafed two days ago without relief. Pain is located behind right ear. Rates pain 7/10 and describes as sharp throbbing pain. No  change in lifestyle, eating, stress. Denies any recent head trauma. This kind of headache is unusual for her. She is not a "headache" person. Treated recently for maxillary sinusitis on 05/18/23.   Feels somewhat disoriented, especially on the right side of her vision field. 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 She has been taking alendronate for one year - unsure if this is contributing to her symptom(s).    No family history of stroke. Denies chest pain, shortness of breath. No change in bedding, pillows. Has not had her yearly eye exam for this year.   Denies rash. Had bilateral nasal sinus surgery in 2015.  Denies sinus pressure, flu symptoms.    New daily persistent headache Neuro exam is wnl No red flags on my exam Will obtain MR without contrast to further evaluate given persistence and age >70 If new/worsening symptom(s), likely will need to go to the ER Consider neurology referral    Past Medical History:  Diagnosis Date   Allergy Child   Anemia    Arthritis    Asthma    Eczema    GERD (gastroesophageal reflux disease) 10/22   Hiatal hernia    Osteoporosis    Ulcer 6/23   Vaginal delivery    x 2, 1985, 1990    Past Surgical History:  Procedure Laterality Date   COLONOSCOPY  01/23/2022   normal - 2012   LIPOMA EXCISION Right 03/27/2023   back   NASAL SINUS SURGERY Bilateral 2015   OTHER SURGICAL HISTORY     lipoma removed from back 03/2023    Family History  Problem Relation Age of Onset   Colon cancer Mother    Cancer Mother    Hearing loss Mother    Heart failure Father    Asthma Father    COPD Father    Hearing loss Father    Crohn's disease Sister    Pancreatic cancer Other    Ovarian cancer Other    Uterine cancer Other    Allergic rhinitis Grandson    Food Allergy Grandson    Asthma Daughter     Social History:  reports that she has never smoked. She has never been exposed to tobacco smoke. She has never used smokeless tobacco. She reports that she does not currently use alcohol. She reports that she does not currently use drugs after having used the following drugs: Marijuana.  Allergies:  Allergies  Allergen Reactions   Other Hives    Adhesives   Tape Rash    Medications: I have reviewed the patient's current medications.  The PMH, PSH, Medications, Allergies, and SH were reviewed and updated.  ROS: Constitutional: Negative for fever, weight loss and weight gain. Cardiovascular: Negative for chest pain and dyspnea on exertion. Respiratory: Is not experiencing shortness of breath at rest. Gastrointestinal: Negative for nausea and vomiting. Neurological: Negative for  headaches. Psychiatric: The patient is not nervous/anxious  Blood pressure (!) 152/73, pulse 86, SpO2 96%.  PHYSICAL EXAM:  Exam: PHYSICAL EXAM:  Exam: General: Well-developed, well-nourished Communication and Voice: Clear pitch and clarity Respiratory Respiratory effort: Equal inspiration and expiration without stridor Cardiovascular Peripheral Vascular: Warm extremities with equal color/perfusion Eyes: No nystagmus with equal extraocular motion bilaterally Neuro/Psych/Balance: Patient oriented to person, place, and time; Appropriate mood and affect; Gait is intact with no imbalance; Cranial nerves I-XII are intact Head and Face Inspection: Normocephalic and atraumatic without mass or lesion Palpation: Facial skeleton intact without bony stepoffs Salivary Glands: No mass or  tenderness Facial Strength: Facial motility symmetric and full bilaterally ENT Pinna: External ear intact and fully developed External canal: Canal is patent with intact skin Tympanic Membrane: Clear and mobile External Nose: No scar or anatomic deformity Internal Nose: Septum is deviated to the left. (+) bilateral nasal polyps, no purulence. Mucosal edema and erythema present.  Bilateral inferior turbinate hypertrophy present.  Lips, Teeth, and gums: Mucosa and teeth intact and viable TMJ: No pain to palpation with full mobility Oral cavity/oropharynx: No erythema or exudate, no lesions present Nasopharynx: No mass or lesion with intact mucosa Hypopharynx: Intact mucosa without pooling of secretions Larynx Glottic: Full true vocal cord mobility without lesion or mass Supraglottic: Normal appearing epiglottis and AE folds Interarytenoid Space: Moderate pachydermia/edema Subglottic Space: Patent without lesion or edema Neck Neck and Trachea: Midline trachea without mass or lesion Thyroid: No mass or nodularity Lymphatics: No lymphadenopathy  Procedure:  Preoperative diagnosis: dysphonia and voice  changes/fluctuations/hoarseness  Postoperative diagnosis:   Same + GERD LPR  Procedure: Flexible fiberoptic laryngoscopy  Surgeon: Ashok Croon, MD  Anesthesia: Topical lidocaine and Afrin Complications: None Condition is stable throughout exam  Indications and consent:  The patient presents to the clinic with Indirect laryngoscopy view was incomplete. Thus it was recommended that they undergo a flexible fiberoptic laryngoscopy. All of the risks, benefits, and potential complications were reviewed with the patient preoperatively and verbal informed consent was obtained.  Procedure: The patient was seated upright in the clinic. Topical lidocaine and Afrin were applied to the nasal cavity. After adequate anesthesia had occurred, I then proceeded to pass the flexible telescope into the nasal cavity. The nasal cavity was patent without rhinorrhea or polyp. The nasopharynx was also patent without mass or lesion. The base of tongue was visualized and was normal. There were no signs of pooling of secretions in the piriform sinuses. The true vocal folds were mobile bilaterally. There were no signs of glottic or supraglottic mucosal lesion or mass. There was moderate interarytenoid pachydermia and post cricoid edema. The telescope was then slowly withdrawn and the patient tolerated the procedure throughout.    PROCEDURE NOTE: nasal endoscopy  Preoperative diagnosis: chronic sinusitis symptoms hx of nasal polyps and FESS  Postoperative diagnosis: same  Procedure: Diagnostic nasal endoscopy (16109)  Surgeon: Ashok Croon, M.D.  Anesthesia: Topical lidocaine and Afrin  H&P REVIEW: The patient's history and physical were reviewed today prior to procedure. All medications were reviewed and updated as well. Complications: None Condition is stable throughout exam Indications and consent: The patient presents with symptoms of chronic sinusitis not responding to previous therapies. All the  risks, benefits, and potential complications were reviewed with the patient preoperatively and informed consent was obtained. The time out was completed with confirmation of the correct procedure.   Procedure: The patient was seated upright in the clinic. Topical lidocaine and Afrin were applied to the nasal cavity. After adequate anesthesia had occurred, the rigid nasal endoscope was passed into the nasal cavity. The nasal mucosa, turbinates, septum, and sinus drainage pathways were visualized bilaterally. This revealed no purulence or significant secretions that might be cultured. There were bilateral nasal polyps. The mucosa was intact and there was no crusting present. The scope was then slowly withdrawn and the patient tolerated the procedure well. There were no complications or blood loss.    Studies Reviewed:CT max/face CLINICAL DATA:  Sinus mass on recent MRI.   EXAM: CT sinuses 08/22/23   FINDINGS: Paranasal sinuses:   Frontal: Persistent, near complete opacification  bilaterally with associated chronic osteitis.   Ethmoid: Moderate residual air cell opacification bilaterally, predominantly involving anterior air cells and overall greatly improved from prior (particularly posteriorly).   Maxillary: Prominent circumferential, mildly nodular/polypoid mucosal thickening bilaterally with associated partial mineralization of sinus contents and with chronic osteitis. Improved aeration from prior.   Sphenoid: Minimal mucosal thickening and possible trace fluid bilaterally, improved from prior.   Right ostiomeatal unit: Patent maxillary antrostomy.   Left ostiomeatal unit: Patent maxillary antrostomy.   Nasal passages: Bilateral middle turbinate concha bullosa. Residual mucosal thickening primarily in the anterosuperior aspect of the nasal cavity, improved from prior. Essentially midline nasal septum.   Anatomy: No pneumatization superior to anterior ethmoid notches. Keros II  with the left fovea ethmoidalis again noted to be higher than on the right. Sellar sphenoid pneumatization pattern. No dehiscence of carotid or optic canals. No onodi cell.   Other: Clear mastoid air cells and middle ear cavities. Unremarkable appearance of the orbits and included portion of the brain.   IMPRESSION: Overall improved pansinusitis although with persistent near complete opacification of the frontal sinuses.  CT MAXILLOFACIAL WITH CONTRAST 07/17/23   TECHNIQUE: Multidetector CT imaging of the maxillofacial structures was performed with intravenous contrast. Multiplanar CT image reconstructions were also generated.   RADIATION DOSE REDUCTION: This exam was performed according to the departmental dose-optimization program which includes automated exposure control, adjustment of the mA and/or kV according to patient size and/or use of iterative reconstruction technique.   CONTRAST:  75mL OMNIPAQUE IOHEXOL 300 MG/ML  SOLN   COMPARISON:  Brain MRI 07/10/2023   FINDINGS: Osseous: No fracture or destructive changes   Orbits: Unremarkable   Sinuses: Essentially confluent opacification of maxillary, ethmoid, and frontal sinuses bilaterally with diffuse obstruction of the upper nasal cavity by mucosal thickening. Partially mineralized debris is seen in the maxillary sinuses and extending towards the widened ostia. No clear destructive changes, asymmetry at the left cribriform plate on prior brain MRI is likely due to the higher left fovea ethmoidalis, a normal variant. No invasive features are seen. The nasal septum is nearly midline.   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 No destructive or invasive features.  MRI Brain EXAM: MRI HEAD WITHOUT CONTRAST   TECHNIQUE: Multiplanar, multiecho pulse sequences of the brain and surrounding structures were  obtained without intravenous contrast.   COMPARISON:  None Available.   FINDINGS: Brain: Negative for acute infarct. Small microhemorrhages in the inferomedial right frontal and left frontal lobe. No hydrocephalus. No extra-axial fluid collection. No mass effect. No mass lesion. Sequela of mild chronic microvascular ischemic change.   Vascular: Normal flow voids.   Skull and upper cervical spine: Normal marrow signal.   Sinuses/Orbits: No middle ear or mastoid effusion. There is near-complete opacification of the bilateral maxillary, ethmoid, and frontal sinuses. There is partial opacification of the bilateral sphenoid sinuses. The cribriform plate appears somewhat irregular and there appears to be mass effect on the olfactory sulcus (series 117, image 63). The integrity of the lamina papyracea on the left is also uncertain (series 117, image 56).   Other: None.   IMPRESSION: 1. No acute intracranial abnormality. 2. Near-complete opacification of the bilateral maxillary, ethmoid, and frontal sinuses. The cribriform plate appears somewhat irregular and there appears to be mass effect on the olfactory sulcus. The integrity of the lamina papyracea on the left is also uncertain. Recommend further evaluation with a dedicated contrast-enhanced maxillofacial CT.  Assessment/Plan: Encounter Diagnoses  Name Primary?   Chronic pansinusitis    Hypertrophy of both inferior nasal turbinates    Nasal polyps    Sinusitis with nasal polyps Yes   Environmental and seasonal allergies    Chronic nasal congestion    Deviated nasal septum      Chronic sinusitis with nasal polyps + septal deviation and inferior turbinate hypertrophy - s/p FESS several years ago, with sx recurrence and evidence of severe inflammation bilateral maxillary and ethmoid sinuses -Initially had MRI brain which showed chronic sinus disease and potential irregularity at cribriform plat, but max face CT done following  MRI brain did not reveal any bony defects in the skull base including area of concern along the cribriform plate -I discussed findings of both imaging studies with the patient -Based on my evaluation today including nasal endoscopy she has evidence of polyp regrowth and chronic nasal congestion along with septal deviation inferior turban hypertrophy and purulent secretions along the left middle meatus consistent with chronic rhinosinusitis with nasal polyps Plan  - she had recent CBC with diff, will order total IgE - CT sinuses brainlab protocol - CT max face with fewer images - need for image guidance during surgery  - nasal steroid rinses with [employer] Med nasal saline/mometasone to shrink the polyps and help with sx post-op -  Doxycycline 100 mg BID for sinus infection and Medrol Dose pack (4 mg tab pack, take as directed)  - Allergy referral for consideration to receive biologic such as Dupixent vs Xolair (hx of asthma) - I discussed management of CRSwNP and importance of surgical options and medical management of allergies and chronic inflammation - we discussed risks and benefits of surgery and she would like to proceed - will schedule for revision sinus surgery (bilateral revision FESS, maxillary antrostomy b/l, vs balloon sinuplasty, bilateral total ethmoidectomy, bilateral nasal polypectomy, possible inferior turbinate reduction, possible septoplasty) -She reports history of normal IgA when she had her initial [place], she inquired about the significance of the test result, I advised her to have immunoglobulin levels checked with an allergist, who can better discussed the significance of it if her levels are abnormal  2. Environmental allergies and hx of asthma -Continue Zyrtec 10 mg daily -Stop Flonase after initiation of nasal steroid rinses -Continue Singulair  3. Asthma  -Continue albuterol as needed -Continue Singulair 10 mg daily -See  pulmonary as needed established with Melody Comas, MD   Update 09/05/23 She saw Allergy, and had testing done. She had CT done. She was allergic to aspergillus and a couple of weeds on allergy testing with Dr Selena Batten. Being considered for Dupixent.   Chronic sinusitis with nasal polyps + septal deviation and inferior turbinate hypertrophy - s/p FESS several years ago, with sx recurrence and evidence of severe inflammation bilateral maxillary and ethmoid sinuses on CT max/face a few months ago.  -Initially had MRI brain which showed chronic sinus disease and potential irregularity at cribriform plat, but max face CT done following MRI brain did not reveal any bony defects in the skull base including area of concern along the cribriform plate -I discussed findings of both imaging studies with the patient -Based on nasal endoscopy during initial office visit she has evidence of polyp regrowth and chronic nasal congestion along with septal deviation inferior turbinate hypertrophy and purulent secretions along the left middle meatus consistent with chronic rhinosinusitis with nasal polyps - took abx and steroids and repeat  CT sinuses was done - which showed overall improved paranasal sinus disease with persistent opacification of frontal sinuses partial opacification of maxillary and ethmoid sinuses bilaterally -I reviewed imaging findings with the patient and we will plan for repeat nasal endoscopy in a few weeks prior to her scheduled sinus procedure - She had elevated total IgE   Plan  - continue nasal steroid rinses with [employer]er Med nasal saline/mometasone to shrink the polyps and help with sx post-op - proceed with Dupixent if able to get insurance approval - I discussed management of CRSwNP and importance of surgical options and medical management of allergies and chronic inflammation - we discussed risks and benefits of surgery and she would like to proceed - will plan for revision sinus surgery  (based on imaging results we will plan for bilateral maxillary sinus lavage, removal of the residual ethmoid partitions and frontal balloon sinuplasty and inferior turbinate reduction.  -She reports history of normal IgA when she had her initial work[place], she inquired about the significance of the test result, I advised her to have immunoglobulin levels checked with an allergist, who can better discussed the significance of it if her levels are abnormal  2. Environmental allergies and hx of asthma -Continue Zyrtec 10 mg daily -continue nasal steroid rinses with Budesonide -Continue Singulair - initiate Dupixent if a candidate and approved by insurance  3. Asthma  -Continue albuterol as needed -Continue Singulair 10 mg daily -See pulmonary as needed established with Melody Comas, MD  RTC in a few weeks, will plan repeat nasal endoscopy and will discuss details of planned surgery.  Update 10/15/23 Assessment and Plan    Chronic Sinusitis with Nasal Polyps Chronic sinusitis with nasal polyps, previously treated with surgery. Current symptoms include mucus retention, nasal congestion, and polyps observed on examination/CT scan. 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 Dupixent initiated with three injections so far. Plan for less extensive surgery to remove partitions, wash out maxillary sinuses, and address frontal sinuses. Discussed benefits (improved drainage, reduced symptoms), risks, and recovery expectations (shorter than previous surgery). Shared decision-making regarding procedure and continuation of Dupixent. Discussed postoperative care including sinus precautions and minimal packing. - Proceed with revision FESS  - Continue Dupixent injections - Provide postoperative care instructions including sinus precautions and minimal packing - Schedule follow-up appointment on  November 05, 2023  Dysphonia and sx of Laryngitis after initial Dupixent shot, voice loss for a few days, likely due to concurrent viral infection vs reflux laryngitis with symptoms of voice changes and throat irritation. No evidence of bacterial laryngitis. Currently takes Pepcid. Recommended a natural supplement to complement Pepcid and reduce reflux symptoms. - Continue Pepcid 20 mg daily  - Recommend Reflux Gourmet supplement to be taken after meals and before bedtime - diet and lifestyle changes to minimize reflux   Follow-up - Follow-up appointment on November 05, 2023 postop - Follow-up with allergist on October 31, 2023.    I spent 30 minutes in total face-to-face time and in reviewing records during pre-charting, more than 50% of which was spent in counseling and coordination of care, reviewing test results, reviewing medications and treatment regimen and/or in discussing or reviewing the diagnosis, the prognosis and treatment options. Pertinent laboratory and imaging test results that were available during this visit with the patient were reviewed by me and considered in my medical decision making (see chart for details).  Ashok Croon, MD Otolaryngology [facility] Phone: [phone] Fax: [phone]    10/15/2023, 6:56 PM

## 2023-10-17 ENCOUNTER — Encounter: Payer: BC Managed Care – PPO | Admitting: Physical Therapy

## 2023-10-21 NOTE — Progress Notes (Unsigned)
Follow Up Note  RE: Lindsey Knight MRN: [id number] DOB: [birth year] Date of Office Visit: 10/22/2023  Referring provider: Jarold Motto, PA Primary care provider: Jarold Motto, PA  Chief Complaint: No chief complaint on file.  History of Present Illness: I had the pleasure of seeing Lindsey Knight for a follow up visit at the [facility] on 10/21/2023. She is a 62 y.o. female, who is being followed for allergic rhinitis, nasal polyps, asthma and recurrent sinusitis. Her previous allergy office visit was on 08/31/2023 with Nehemiah Settle FNP. Today is a regular follow up visit.  Discussed the use of AI scribe software for clinical note transcription with the patient, who gave verbal consent to proceed.  History of Present Illness            Assessment and Plan: 1. Seasonal and perennial allergic rhinitis   2. Nasal polyps   3. Moderate persistent asthma without complication   4. Recurrent sinusitis       No orders of the defined types were placed in this encounter.     Patient Instructions  Nasal polyps Follow up with ENT as scheduled. Recommend Dupixent injections -message sent to Tammy as she is interested. Continue with budesonide saline wash. Continue Singulair (montelukast) 10mg  daily at night. Environmental allergy testing today is: Positive to a couple weeds with adequate controls.  Intradermal testing is positive to major mold mix 2 Copy of skin test given Start avoidance measures as below   Moderate persistent asthma- managed by pulmonology Keep follow up with pulmonology.  Daily controller medication(s): continue Advair 1 puff twice a day and rinse mouth after each use.  May use albuterol rescue inhaler 2 puffs every 4 to 6 hours as needed for shortness of breath, chest tightness, coughing, and wheezing.  Monitor frequency of use - if you need to use it more than twice per week on a consistent basis let us know.  Breathing control  goals:  Full participation in all desired activities (may need albuterol before activity) Albuterol use two times or less a week on average (not counting use with activity) Cough interfering with sleep two times or less a month Oral steroids no more than once a year No hospitalizations    Recurrent sinusitis Keep track of infections and antibiotics use. If persistent will get bloodwork next to look at immune system.   Other allergic rhinitis Nasal polyps Sinus surgery in 2013. Currently experiencing congestion and postnasal drip. Currently on Zyrtec, Singulair, Atrovent, and budesonide nasal rinses. 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 No worsening symptoms after taking NSAIDs. Elevated IgE level.  Follow up with ENT as scheduled. Recommend Dupixent injections - handout given. This will also help with her asthma symptoms.  Continue with budesonide saline wash as per ENT.  Continue Singulair (montelukast) 10mg  daily at night. Recommend environmental allergy testing.  Will make additional recommendations based on results.    Recurrent sinusitis Keep track of infections and antibiotics use. If persistent will get bloodwork next to look at immune system.    Moderate persistent asthma without complication Managed by pulmonologist with Advair and Albuterol as needed. Asthma exacerbations requiring prednisone courses three times this year. Keep follow up with pulmonology.  Daily controller medication(s): continue Advair 1 puff twice a day and rinse mouth after each use.  May use albuterol rescue inhaler 2 puffs every 4 to 6 hours as needed for shortness of breath, chest tightness, coughing, and wheezing.  Monitor frequency of use - if you need to use it more than twice per week on a consistent basis let us know.   Assessment and Plan: Lindsey Knight is a 62 y.o. female with: *** Assessment and Plan              No follow-ups on  file.  No orders of the defined types were placed in this encounter.  Lab Orders  No laboratory test(s) ordered today    Diagnostics: Spirometry:  Tracings reviewed. Her effort: {Blank single:19197::"Good reproducible efforts.","It was hard to get consistent efforts and there is a question as to whether this reflects a maximal maneuver.","Poor effort, data can not be interpreted."} FVC: ***L FEV1: ***L, ***% predicted FEV1/FVC ratio: ***% Interpretation: {Blank single:19197::"Spirometry consistent with mild obstructive disease","Spirometry consistent with moderate obstructive disease","Spirometry consistent with severe obstructive disease","Spirometry consistent with possible restrictive disease","Spirometry consistent with mixed obstructive and restrictive disease","Spirometry uninterpretable due to technique","Spirometry consistent with normal pattern","No overt abnormalities noted given today's efforts"}.  Please see scanned spirometry results for details.  Skin Testing: {Blank single:19197::"Select foods","Environmental allergy panel","Environmental allergy panel and select foods","Food allergy panel","None","Deferred due to recent antihistamines use"}. *** Results discussed with patient/family.   Medication List:  Current Outpatient Medications  Medication Sig Dispense Refill   albuterol (VENTOLIN HFA) 108 (90 Base) MCG/ACT inhaler Inhale 1 puff into the lungs every 6 (six) hours as needed.     alendronate (FOSAMAX) 70 MG tablet Take 1 tablet (70 mg total) by mouth every 7 (seven) days. Take with a full glass of water on an empty stomach. 12 tablet 4   Ascorbic Acid (VITAMIN C CR) 500 MG TBCR Take 1 tablet by mouth every other day.     benzonatate (TESSALON) 100 MG capsule Take 1 capsule (100 mg total) by mouth 3 (three) times daily as needed. 30 capsule 1   budesonide (PULMICORT) 0.25 MG/2ML nebulizer solution Take 0.25 mg by nebulization 2 (two) times daily. Nasal rinse      Cetirizine HCl (ZYRTEC PO) Take by mouth.     Cholecalciferol (VITAMIN D3 ADULT GUMMIES PO)      Cyanocobalamin (VITAMIN B-12 PO) Take 1,000 mcg by mouth daily in the afternoon.     dupilumab (DUPIXENT) 300 MG/2ML prefilled syringe Inject 300 mg into the skin every 14 (fourteen) days. 4 mL 11   estradiol (ESTRACE VAGINAL) 0.1 MG/GM vaginal cream Place 1 g vaginally 3 (three) times a week. 42.5 g 12   famotidine (PEPCID) 20 MG tablet Take 20 mg by mouth daily.     ferrous sulfate 324 MG TBEC Take 324 mg by mouth every other day.     fluticasone-salmeterol (ADVAIR DISKUS) 250-50 MCG/ACT AEPB Inhale 1 puff into the lungs in the morning and at bedtime. 60 each 6   MAGNESIUM GLYCINATE PO      Melatonin 5 MG CHEW Chew by mouth.     montelukast (SINGULAIR) 10 MG tablet TAKE 1 TABLET BY MOUTH EVERY NIGHT AT BEDTIME 90 tablet 3   Multiple Vitamins-Minerals (MULTIVITAMIN ADULT) CHEW      traZODone (DESYREL) 100 MG tablet Take 1 tablet (100 mg total) by mouth at bedtime. 90 tablet 3   No current facility-administered medications for this visit.   Allergies: Allergies  Allergen Reactions   Other Hives    Adhesives   Tape Rash   I reviewed her past medical history, social history, family history, and environmental history and no significant changes have been reported from her previous visit.  Review of  Systems  Constitutional:  Negative for appetite change, chills, fever and unexpected weight change.  HENT:  Positive for congestion and postnasal drip. Negative for rhinorrhea.   Eyes:  Negative for itching.  Respiratory:  Positive for cough. Negative for chest tightness, shortness of breath and wheezing.   Cardiovascular:  Negative for chest pain.  Gastrointestinal:  Negative for abdominal pain.  Genitourinary:  Negative for difficulty urinating.  Skin:  Negative for rash.  Allergic/Immunologic: Positive for environmental allergies.  Neurological:  Negative for headaches.    Objective: There  were no vitals taken for this visit. There is no height or weight on file to calculate BMI. Physical Exam Vitals and nursing note reviewed.  Constitutional:      Appearance: Normal appearance. She is well-developed.  HENT:     Head: Normocephalic and atraumatic.     Right Ear: Tympanic membrane and external ear normal.     Left Ear: Tympanic membrane and external ear normal.     Nose:     Comments: Turbinate hypertrophy. Possible polyp on right side.     Mouth/Throat:     Mouth: Mucous membranes are moist.     Pharynx: Oropharynx is clear.  Eyes:     Conjunctiva/sclera: Conjunctivae normal.  Cardiovascular:     Rate and Rhythm: Normal rate and regular rhythm.     Heart sounds: Normal heart sounds. No murmur heard.    No friction rub. No gallop.  Pulmonary:     Effort: Pulmonary effort is normal.     Breath sounds: Normal breath sounds. No wheezing, rhonchi or rales.  Musculoskeletal:     Cervical back: Neck supple.  Skin:    General: Skin is warm.     Findings: No rash.  Neurological:     Mental Status: She is alert and oriented to person, place, and time.  Psychiatric:        Behavior: Behavior normal.    Previous notes and tests were reviewed. The plan was reviewed with the patient/family, and all questions/concerned were addressed.  It was my pleasure to see Lindsey Knight today and participate in her care. Please feel free to contact me with any questions or concerns.  Sincerely,  Wyline Mood, DO Allergy & Immunology  [facility] office: [phone] [facility] office: [phone]

## 2023-10-22 ENCOUNTER — Encounter: Payer: Self-pay | Admitting: Allergy

## 2023-10-22 ENCOUNTER — Ambulatory Visit: Payer: BC Managed Care – PPO | Admitting: Allergy

## 2023-10-22 ENCOUNTER — Other Ambulatory Visit: Payer: Self-pay

## 2023-10-22 VITALS — BP 136/64 | HR 86 | Temp 98.6°F | Resp 12 | Ht 62.8 in | Wt 157.1 lb

## 2023-10-22 DIAGNOSIS — J329 Chronic sinusitis, unspecified: Secondary | ICD-10-CM | POA: Diagnosis not present

## 2023-10-22 DIAGNOSIS — J301 Allergic rhinitis due to pollen: Secondary | ICD-10-CM | POA: Diagnosis not present

## 2023-10-22 DIAGNOSIS — J4540 Moderate persistent asthma, uncomplicated: Secondary | ICD-10-CM

## 2023-10-22 DIAGNOSIS — J3089 Other allergic rhinitis: Secondary | ICD-10-CM

## 2023-10-22 DIAGNOSIS — J339 Nasal polyp, unspecified: Secondary | ICD-10-CM | POA: Diagnosis not present

## 2023-10-22 MED ORDER — ALBUTEROL SULFATE HFA 108 (90 BASE) MCG/ACT IN AERS
2.0000 | INHALATION_SPRAY | RESPIRATORY_TRACT | 1 refills | Status: AC | PRN
Start: 2023-10-22 — End: ?

## 2023-10-22 NOTE — Patient Instructions (Addendum)
Polyps/rhinitis Follow up with ENT as scheduled - surgery.  Continue Dupixent injections every 2 weeks.  Continue with budesonide saline wash twice a day. Continue Singulair (montelukast) 10mg  daily at night.  Asthma Today's breathing test was normal.  Daily controller medication(s): continue Advair 1 puff twice a day and rinse mouth after each use.  Continue Singulair (montelukast) 10mg  daily at night. May use albuterol rescue inhaler 2 puffs or nebulizer every 4 to 6 hours as needed for shortness of breath, chest tightness, coughing, and wheezing. May use albuterol rescue inhaler 2 puffs 5 to 15 minutes prior to strenuous physical activities. Monitor frequency of use - if you need to use it more than twice per week on a consistent basis let us know.  Breathing control goals:  Full participation in all desired activities (may need albuterol before activity) Albuterol use two times or less a week on average (not counting use with activity) Cough interfering with sleep two times or less a month Oral steroids no more than once a year No hospitalizations   Infections Keep track of infections and antibiotics use. If persistent will get bloodwork next to look at immune system.   Follow up with me in 2 months or sooner if needed.

## 2023-10-23 NOTE — Anesthesia Preprocedure Evaluation (Signed)
Anesthesia Evaluation  Patient identified by MRN, date of birth, ID band Patient awake    Reviewed: Allergy & Precautions, H&P , NPO status , Patient's Chart, lab work & pertinent test results  Airway Mallampati: II  TM Distance: >3 FB Neck ROM: Full    Dental no notable dental hx. (+) Teeth Intact, Dental Advisory Given, Caps   Pulmonary neg pulmonary ROS, asthma    Pulmonary exam normal breath sounds clear to auscultation       Cardiovascular Exercise Tolerance: Good negative cardio ROS Normal cardiovascular exam Rhythm:Regular Rate:Normal     Neuro/Psych  Neuromuscular disease negative neurological ROS  negative psych ROS   GI/Hepatic negative GI ROS, Neg liver ROS, hiatal hernia,GERD  ,,  Endo/Other  negative endocrine ROS    Renal/GU negative Renal ROS  negative genitourinary   Musculoskeletal negative musculoskeletal ROS (+) Arthritis ,    Abdominal   Peds negative pediatric ROS (+)  Hematology negative hematology ROS (+) Blood dyscrasia, anemia   Anesthesia Other Findings   Reproductive/Obstetrics negative OB ROS                             Anesthesia Physical Anesthesia Plan  ASA: 2  Anesthesia Plan: General   Post-op Pain Management: Minimal or no pain anticipated, Tylenol PO (pre-op)* and Celebrex PO (pre-op)*   Induction: Intravenous  PONV Risk Score and Plan: 3 and Ondansetron, Dexamethasone, Treatment may vary due to age or medical condition and Midazolam  Airway Management Planned: Oral ETT  Additional Equipment: None  Intra-op Plan:   Post-operative Plan: Extubation in OR  Informed Consent: I have reviewed the patients History and Physical, chart, labs and discussed the procedure including the risks, benefits and alternatives for the proposed anesthesia with the patient or authorized representative who has indicated his/her understanding and acceptance.        Plan Discussed with: Anesthesiologist and CRNA  Anesthesia Plan Comments: (  )        Anesthesia Quick Evaluation

## 2023-10-24 ENCOUNTER — Encounter (HOSPITAL_BASED_OUTPATIENT_CLINIC_OR_DEPARTMENT_OTHER): Payer: Self-pay

## 2023-10-24 ENCOUNTER — Ambulatory Visit (HOSPITAL_BASED_OUTPATIENT_CLINIC_OR_DEPARTMENT_OTHER): Payer: Self-pay | Admitting: Anesthesiology

## 2023-10-24 ENCOUNTER — Other Ambulatory Visit: Payer: Self-pay

## 2023-10-24 ENCOUNTER — Encounter (HOSPITAL_BASED_OUTPATIENT_CLINIC_OR_DEPARTMENT_OTHER)
Admission: RE | Disposition: A | Discharge status: Home / Self Care | Payer: Self-pay | Source: Home / Self Care | Attending: Otolaryngology

## 2023-10-24 ENCOUNTER — Ambulatory Visit (HOSPITAL_BASED_OUTPATIENT_CLINIC_OR_DEPARTMENT_OTHER)
Admission: RE | Admit: 2023-10-24 | Discharge: 2023-10-24 | Disposition: A | Discharge status: Home / Self Care | Payer: BC Managed Care – PPO | Attending: Otolaryngology | Admitting: Otolaryngology

## 2023-10-24 DIAGNOSIS — J45909 Unspecified asthma, uncomplicated: Secondary | ICD-10-CM | POA: Diagnosis not present

## 2023-10-24 DIAGNOSIS — J324 Chronic pansinusitis: Secondary | ICD-10-CM | POA: Diagnosis not present

## 2023-10-24 DIAGNOSIS — J339 Nasal polyp, unspecified: Secondary | ICD-10-CM | POA: Diagnosis not present

## 2023-10-24 DIAGNOSIS — J343 Hypertrophy of nasal turbinates: Secondary | ICD-10-CM | POA: Diagnosis not present

## 2023-10-24 DIAGNOSIS — Z79899 Other long term (current) drug therapy: Secondary | ICD-10-CM | POA: Insufficient documentation

## 2023-10-24 DIAGNOSIS — J321 Chronic frontal sinusitis: Secondary | ICD-10-CM

## 2023-10-24 DIAGNOSIS — K449 Diaphragmatic hernia without obstruction or gangrene: Secondary | ICD-10-CM | POA: Insufficient documentation

## 2023-10-24 DIAGNOSIS — J322 Chronic ethmoidal sinusitis: Secondary | ICD-10-CM

## 2023-10-24 DIAGNOSIS — J329 Chronic sinusitis, unspecified: Secondary | ICD-10-CM | POA: Diagnosis not present

## 2023-10-24 DIAGNOSIS — K219 Gastro-esophageal reflux disease without esophagitis: Secondary | ICD-10-CM | POA: Insufficient documentation

## 2023-10-24 DIAGNOSIS — J342 Deviated nasal septum: Secondary | ICD-10-CM | POA: Insufficient documentation

## 2023-10-24 DIAGNOSIS — J320 Chronic maxillary sinusitis: Secondary | ICD-10-CM

## 2023-10-24 DIAGNOSIS — Z419 Encounter for procedure for purposes other than remedying health state, unspecified: Secondary | ICD-10-CM

## 2023-10-24 HISTORY — PX: NASAL SEPTOPLASTY W/ TURBINOPLASTY: SHX2070

## 2023-10-24 HISTORY — PX: SINUS ENDO W/FUSION: SHX777

## 2023-10-24 HISTORY — DX: Other injury of unspecified body region, initial encounter: T14.8XXA

## 2023-10-24 SURGERY — SEPTOPLASTY, NOSE, WITH NASAL TURBINATE REDUCTION
Anesthesia: General | Site: Nose | Laterality: Bilateral

## 2023-10-24 MED ORDER — LIDOCAINE-EPINEPHRINE 1 %-1:100000 IJ SOLN
INTRAMUSCULAR | Status: DC | PRN
  Administered 2023-10-24: 1 mL

## 2023-10-24 MED ORDER — LACTATED RINGERS IV SOLN: INTRAVENOUS | Status: DC

## 2023-10-24 MED ORDER — ACETAMINOPHEN 500 MG PO TABS
ORAL_TABLET | ORAL | Status: AC
  Filled 2023-10-24: qty 2

## 2023-10-24 MED ORDER — SODIUM CHLORIDE 0.9 % IV SOLN
INTRAVENOUS | Status: AC | PRN
  Administered 2023-10-24: 80 mL

## 2023-10-24 MED ORDER — ACETAMINOPHEN 160 MG/5ML PO SOLN: 325.0000 mg | ORAL | Status: DC | PRN

## 2023-10-24 MED ORDER — OXYMETAZOLINE HCL 0.05 % NA SOLN
NASAL | Status: AC
  Filled 2023-10-24: qty 30

## 2023-10-24 MED ORDER — SODIUM CHLORIDE 0.9 % IV SOLN
3.0000 g | INTRAVENOUS | Status: AC
  Administered 2023-10-24: 3 g via INTRAVENOUS

## 2023-10-24 MED ORDER — 0.9 % SODIUM CHLORIDE (POUR BTL) OPTIME
TOPICAL | Status: DC | PRN
  Administered 2023-10-24: 100 mL

## 2023-10-24 MED ORDER — OXYCODONE HCL 5 MG PO TABS: 5.0000 mg | ORAL_TABLET | Freq: Four times a day (QID) | ORAL | 0 refills | Status: DC | PRN

## 2023-10-24 MED ORDER — FENTANYL CITRATE (PF) 100 MCG/2ML IJ SOLN
INTRAMUSCULAR | Status: AC
  Filled 2023-10-24: qty 2

## 2023-10-24 MED ORDER — SULFAMETHOXAZOLE-TRIMETHOPRIM 800-160 MG PO TABS: 1.0000 | ORAL_TABLET | Freq: Two times a day (BID) | ORAL | 0 refills | Status: DC

## 2023-10-24 MED ORDER — EPHEDRINE SULFATE-NACL 50-0.9 MG/10ML-% IV SOSY
PREFILLED_SYRINGE | INTRAVENOUS | Status: DC | PRN
  Administered 2023-10-24 (×2): 5 mg via INTRAVENOUS

## 2023-10-24 MED ORDER — ALBUTEROL SULFATE (2.5 MG/3ML) 0.083% IN NEBU
2.5000 mg | INHALATION_SOLUTION | Freq: Once | RESPIRATORY_TRACT | Status: AC
  Administered 2023-10-24: 2.5 mg via RESPIRATORY_TRACT

## 2023-10-24 MED ORDER — CELECOXIB 200 MG PO CAPS
200.0000 mg | ORAL_CAPSULE | Freq: Once | ORAL | Status: AC
  Administered 2023-10-24: 200 mg via ORAL

## 2023-10-24 MED ORDER — OXYCODONE HCL 5 MG PO TABS: 5.0000 mg | ORAL_TABLET | Freq: Once | ORAL | Status: DC | PRN

## 2023-10-24 MED ORDER — OXYMETAZOLINE HCL 0.05 % NA SOLN
NASAL | Status: AC
  Filled 2023-10-24: qty 60

## 2023-10-24 MED ORDER — MEPERIDINE HCL 25 MG/ML IJ SOLN: 6.2500 mg | INTRAMUSCULAR | Status: DC | PRN

## 2023-10-24 MED ORDER — SODIUM CHLORIDE 0.9 % IV SOLN
INTRAVENOUS | Status: AC
  Filled 2023-10-24: qty 8

## 2023-10-24 MED ORDER — DEXMEDETOMIDINE HCL IN NACL 80 MCG/20ML IV SOLN
INTRAVENOUS | Status: AC
  Filled 2023-10-24: qty 20

## 2023-10-24 MED ORDER — FENTANYL CITRATE (PF) 100 MCG/2ML IJ SOLN
INTRAMUSCULAR | Status: DC | PRN
  Administered 2023-10-24 (×2): 50 ug via INTRAVENOUS

## 2023-10-24 MED ORDER — DEXMEDETOMIDINE HCL IN NACL 80 MCG/20ML IV SOLN
INTRAVENOUS | Status: DC | PRN
  Administered 2023-10-24: 8 ug via INTRAVENOUS

## 2023-10-24 MED ORDER — OXYCODONE HCL 5 MG/5ML PO SOLN: 5.0000 mg | Freq: Once | ORAL | Status: DC | PRN

## 2023-10-24 MED ORDER — PROPOFOL 500 MG/50ML IV EMUL
INTRAVENOUS | Status: DC | PRN
Start: 2023-10-24 — End: 2023-10-24
  Administered 2023-10-24: 30 ug/kg/min via INTRAVENOUS

## 2023-10-24 MED ORDER — EPINEPHRINE HCL (NASAL) 0.1 % NA SOLN
NASAL | Status: AC
  Filled 2023-10-24: qty 10

## 2023-10-24 MED ORDER — ACETAMINOPHEN 500 MG PO TABS
1000.0000 mg | ORAL_TABLET | Freq: Once | ORAL | Status: AC
  Administered 2023-10-24: 1000 mg via ORAL

## 2023-10-24 MED ORDER — CELECOXIB 200 MG PO CAPS
ORAL_CAPSULE | ORAL | Status: AC
  Filled 2023-10-24: qty 1

## 2023-10-24 MED ORDER — MUPIROCIN 2 % EX OINT
TOPICAL_OINTMENT | CUTANEOUS | Status: AC
  Filled 2023-10-24: qty 22

## 2023-10-24 MED ORDER — ONDANSETRON HCL 4 MG/2ML IJ SOLN
INTRAMUSCULAR | Status: AC
  Filled 2023-10-24: qty 2

## 2023-10-24 MED ORDER — PROPOFOL 10 MG/ML IV BOLUS
INTRAVENOUS | Status: DC | PRN
  Administered 2023-10-24: 150 mg via INTRAVENOUS
  Administered 2023-10-24: 20 mg via INTRAVENOUS

## 2023-10-24 MED ORDER — FENTANYL CITRATE (PF) 100 MCG/2ML IJ SOLN
25.0000 ug | INTRAMUSCULAR | Status: DC | PRN
  Administered 2023-10-24: 25 ug via INTRAVENOUS

## 2023-10-24 MED ORDER — LACTATED RINGERS IV SOLN: INTRAVENOUS | Status: DC | PRN

## 2023-10-24 MED ORDER — PROPOFOL 500 MG/50ML IV EMUL
INTRAVENOUS | Status: AC
  Filled 2023-10-24: qty 50

## 2023-10-24 MED ORDER — PHENYLEPHRINE 80 MCG/ML (10ML) SYRINGE FOR IV PUSH (FOR BLOOD PRESSURE SUPPORT)
PREFILLED_SYRINGE | INTRAVENOUS | Status: DC | PRN
  Administered 2023-10-24 (×2): 80 ug via INTRAVENOUS
  Administered 2023-10-24: 160 ug via INTRAVENOUS
  Administered 2023-10-24: 80 ug via INTRAVENOUS
  Administered 2023-10-24: 160 ug via INTRAVENOUS

## 2023-10-24 MED ORDER — ONDANSETRON HCL 4 MG/2ML IJ SOLN
INTRAMUSCULAR | Status: DC | PRN
  Administered 2023-10-24: 4 mg via INTRAVENOUS

## 2023-10-24 MED ORDER — MIDAZOLAM HCL 2 MG/2ML IJ SOLN
INTRAMUSCULAR | Status: DC | PRN
  Administered 2023-10-24: 2 mg via INTRAVENOUS

## 2023-10-24 MED ORDER — ONDANSETRON HCL 4 MG/2ML IJ SOLN: 4.0000 mg | Freq: Once | INTRAMUSCULAR | Status: DC | PRN

## 2023-10-24 MED ORDER — PREDNISONE 10 MG PO TABS: 10.0000 mg | ORAL_TABLET | Freq: Every day | ORAL | 0 refills | Status: DC

## 2023-10-24 MED ORDER — DEXAMETHASONE SODIUM PHOSPHATE 10 MG/ML IJ SOLN
INTRAMUSCULAR | Status: AC
  Filled 2023-10-24: qty 1

## 2023-10-24 MED ORDER — LIDOCAINE-EPINEPHRINE 1 %-1:100000 IJ SOLN
INTRAMUSCULAR | Status: AC
  Filled 2023-10-24: qty 1

## 2023-10-24 MED ORDER — MIDAZOLAM HCL 2 MG/2ML IJ SOLN
INTRAMUSCULAR | Status: AC
Start: 2023-10-24 — End: ?
  Filled 2023-10-24: qty 2

## 2023-10-24 MED ORDER — ACETAMINOPHEN 325 MG PO TABS: 325.0000 mg | ORAL_TABLET | ORAL | Status: DC | PRN

## 2023-10-24 MED ORDER — ALBUTEROL SULFATE (2.5 MG/3ML) 0.083% IN NEBU
INHALATION_SOLUTION | RESPIRATORY_TRACT | Status: AC
  Filled 2023-10-24: qty 3

## 2023-10-24 MED ORDER — ROCURONIUM BROMIDE 10 MG/ML (PF) SYRINGE
PREFILLED_SYRINGE | INTRAVENOUS | Status: DC | PRN
  Administered 2023-10-24: 70 mg via INTRAVENOUS

## 2023-10-24 MED ORDER — OXYMETAZOLINE HCL 0.05 % NA SOLN
NASAL | Status: DC | PRN
  Administered 2023-10-24 (×2): 1 via TOPICAL

## 2023-10-24 MED ORDER — SUGAMMADEX SODIUM 200 MG/2ML IV SOLN
INTRAVENOUS | Status: DC | PRN
  Administered 2023-10-24: 200 mg via INTRAVENOUS

## 2023-10-24 MED ORDER — DEXAMETHASONE SODIUM PHOSPHATE 10 MG/ML IJ SOLN
INTRAMUSCULAR | Status: DC | PRN
  Administered 2023-10-24: 10 mg via INTRAVENOUS

## 2023-10-24 MED ORDER — LIDOCAINE 2% (20 MG/ML) 5 ML SYRINGE
INTRAMUSCULAR | Status: DC | PRN
  Administered 2023-10-24: 100 mg via INTRAVENOUS

## 2023-10-24 SURGICAL SUPPLY — 68 items
ANTIFOG SOL W/FOAM PAD STRL (MISCELLANEOUS) ×1 IMPLANT
BALLN FRONTAL NUVENT 6X17 (BALLOONS) IMPLANT
BALLN FRONTAL NUVENT 6X17 70D (BALLOONS) ×1 IMPLANT
BALLOON FRONTAL NUVENT 6X17 (BALLOONS) IMPLANT
BALLOON FRONTAL NVNT 6X17 70D (BALLOONS) IMPLANT
BLADE INF TURB ROT M4 2 5PK (BLADE) IMPLANT
BLADE ROTATE RAD 40 4 M4 (BLADE) IMPLANT
BLADE ROTATE TRICUT 4X13 M4 (BLADE) ×1 IMPLANT
BLADE SURG 15 STRL LF DISP TIS (BLADE) IMPLANT
CANISTER SUC SOCK COL 7IN (MISCELLANEOUS) ×1 IMPLANT
CANISTER SUCT 1200ML W/VALVE (MISCELLANEOUS) ×2 IMPLANT
COAGULATOR SUCT 8FR VV (MISCELLANEOUS) IMPLANT
COAGULATOR SUCT SWTCH 10FR 6 (ELECTROSURGICAL) IMPLANT
DEFOGGER MIRROR 1QT (MISCELLANEOUS) IMPLANT
DRESSING NASAL KENNEDY 3.5X.9 (MISCELLANEOUS) IMPLANT
DRSG NASAL KENNEDY 3.5X.9 (MISCELLANEOUS) IMPLANT
DRSG NASOPORE 8CM (GAUZE/BANDAGES/DRESSINGS) IMPLANT
ELECT COATED BLADE 2.86 ST (ELECTRODE) IMPLANT
ELECT REM PT RETURN 9FT ADLT (ELECTROSURGICAL) ×1 IMPLANT
ELECTRODE REM PT RTRN 9FT ADLT (ELECTROSURGICAL) ×1 IMPLANT
GAUZE SPONGE 2X2 STRL 8-PLY (GAUZE/BANDAGES/DRESSINGS) ×1 IMPLANT
GLOVE BIO SURGEON STRL SZ 6 (GLOVE) ×1 IMPLANT
GLOVE BIO SURGEON STRL SZ7 (GLOVE) IMPLANT
GLOVE BIOGEL PI IND STRL 7.0 (GLOVE) IMPLANT
GOWN STRL REUS W/ TWL LRG LVL3 (GOWN DISPOSABLE) ×2 IMPLANT
HEMOSTAT ARISTA ABSORB 3G PWDR (HEMOSTASIS) IMPLANT
IMPL PROPEL CONTOUR (Prosthesis and Implant ENT) IMPLANT
IMPLANT PROPEL CONTOUR (Prosthesis and Implant ENT) ×2 IMPLANT
INFLATOR BALLOON W/TUBE (BALLOONS) IMPLANT
IV NS 500ML BAXH (IV SOLUTION) ×1 IMPLANT
IV SET EXT 30 76VOL 4 MALE LL (IV SETS) ×1 IMPLANT
NDL HYPO 25X1 1.5 SAFETY (NEEDLE) ×1 IMPLANT
NDL SAFETY ECLIPSE 18X1.5 (NEEDLE) IMPLANT
NDL SPNL 25GX3.5 QUINCKE BL (NEEDLE) ×1 IMPLANT
NEEDLE HYPO 25X1 1.5 SAFETY (NEEDLE) ×1 IMPLANT
NEEDLE SPNL 25GX3.5 QUINCKE BL (NEEDLE) ×1 IMPLANT
NS IRRIG 1000ML POUR BTL (IV SOLUTION) ×1 IMPLANT
PACK BASIN DAY SURGERY FS (CUSTOM PROCEDURE TRAY) ×1 IMPLANT
PACK ENT DAY SURGERY (CUSTOM PROCEDURE TRAY) ×1 IMPLANT
PATTIES SURGICAL .5 X3 (DISPOSABLE) ×1 IMPLANT
PENCIL SMOKE EVACUATOR (MISCELLANEOUS) IMPLANT
SHEATH ENDOSCRUB 0 DEG (SHEATH) ×1 IMPLANT
SHEATH ENDOSCRUB 30 DEG (SHEATH) IMPLANT
SHEATH ENDOSCRUB 45 DEG (SHEATH) IMPLANT
SHEET MEDIUM DRAPE 40X70 STRL (DRAPES) IMPLANT
SLEEVE SCD COMPRESS KNEE MED (STOCKING) ×1 IMPLANT
SOLUTION ANTFG W/FOAM PAD STRL (MISCELLANEOUS) ×1 IMPLANT
SPLINT NASAL AIRWAY SILICONE (MISCELLANEOUS) ×1 IMPLANT
SPLINT NASAL POSISEP X .6X2 (GAUZE/BANDAGES/DRESSINGS) IMPLANT
SPONGE NEURO XRAY DETECT 1X3 (DISPOSABLE) IMPLANT
SPONGE SURGIFOAM ABS GEL 12-7 (HEMOSTASIS) IMPLANT
SUT CHROMIC 4 0 P 3 18 (SUTURE) ×1 IMPLANT
SUT ETHILON 3 0 FSL (SUTURE) IMPLANT
SUT PLAIN 4 0 ~~LOC~~ 1 (SUTURE) ×1 IMPLANT
SUT PROLENE 3 0 PS 2 (SUTURE) IMPLANT
SUT SILK 2 0 SH (SUTURE) IMPLANT
SWAB COLLECTION DEVICE MRSA (MISCELLANEOUS) IMPLANT
SWAB CULTURE ESWAB REG 1ML (MISCELLANEOUS) IMPLANT
SYR CONTROL 10ML LL (SYRINGE) ×1 IMPLANT
SYR TB 1ML LL NO SAFETY (SYRINGE) ×1 IMPLANT
TOWEL GREEN STERILE FF (TOWEL DISPOSABLE) ×1 IMPLANT
TRACKER ENT INSTRUMENT (MISCELLANEOUS) ×1 IMPLANT
TRACKER ENT PATIENT (MISCELLANEOUS) ×1 IMPLANT
TUBE CONNECTING 20X1/4 (TUBING) ×1 IMPLANT
TUBE SALEM SUMP 12FR 48 (TUBING) IMPLANT
TUBE SALEM SUMP 16F (TUBING) ×1 IMPLANT
TUBING STRAIGHTSHOT EPS 5PK (TUBING) IMPLANT
YANKAUER SUCT BULB TIP NO VENT (SUCTIONS) ×1 IMPLANT

## 2023-10-24 NOTE — Discharge Instructions (Addendum)
Sinus Surgery Post-Operative Care Instructions  What are the sinuses?  The sinuses are air filled cavities (or holes) in the skull that are located adjacent to the nose. The tissue (mucosa) that lines these cavities and the nose swells and secretes mucus in response to infection or environmental irritants. Normally, the mucus produced by the sinuses drains into the nose and is, then, either swallowed or coughed up. With infection, the swelling of the sinus mucosa can make drainage difficult, leading to chronic, recurrent infections.  The triad of nasal congestion, facial discomfort and discolored nasal drainage most frequently defines chronic sinusitis.  There are four pairs of sinuses. (See figure below) 1) Maxillary Sinuses (cheek sinuses) 2) Ethmoid Sinuses (the sinuses located between the eyes)  3) Sphenoid Sinuses (the sinuses located behind the nose) 4) Frontal Sinuses (the sinuses located above the eyes)    How is sinusitis treated?  Antibiotics, steroids, nasal sprays, and decongestants are often successful in treating short-term bouts of sinusitis. When medications fail to provide adequate relief from sinusitis, surgery must be considered.  What is sinus surgery?  1) The goal of sinus surgery is to enlarge the natural openings of the sinuses into the nose. Enlarging these openings makes it easier for the sinuses to drain, even when swollen from infection or environmental irritants. Sinus surgery is also used to remove nasal polyps, nasal masses, and, sometimes, to straighten the nasal septum.  2) Using small cameras with lights on the end (endoscopes) the surgery is performed through the nose, without the need for any external incisions. In addition to the use of endoscopes, special instruments have been designed to perform the task of removing thickened and diseased tissue from the opening of these sinuses.  3) Sinus surgery is generally an outpatient procedure, lasting from one to four  hours. Although nasal packing is no longer common, "spacers" are used to aid proper healing of the sinus mucosa. The spacers will be removed during your first post-operative appointment.   4) Recovery time varies from patient to patient, but, in general, usually lasts between one to two weeks. Initially, patients should expect to feel congested and some mild sinus pressure. As the sinuses slowly heal, this congestion and pressure will decrease.  5) It is important to remember that, while we perform the surgery, you play an active role in the success of its outcome. It is up to you to abide by the postoperative restrictions and implement the postoperative care instructions.   The Do's and Do Not's of postoperative sinus care:  DO: DO take the pain medication prescribed: Oxycodone every 6 hours as needed for pain. You may also use Tylenol taken every 6 hrs and if it is sufficient, you do not need to take Oxycodone. DO take the antibiotics prescribed:Bactrim - 2 times a day for a week DO take the steroid prescribed: Prednisone daily as directed. Continue taking the steroid until you are instructed by your surgeon to stop. DO take live cultures while on the antibiotic: acidophilus/yogurt daily DO start your nasal irrigations the day after surgery.  These irrigations must be performed at least two-three times a day (more is preferable), however it does not hurt to perform them more frequently. This is essential to the healing process. It removes the crusts that form as the nasal tissue heals and prevents scarring within the nose. Please see the following page for specific instructions. DO cough and sneeze with your mouth open. DO eat a regular diet. DO take your pain  medication before your first postoperative appointment.  DO NOT: DO NOT perform any heavy lifting (nothing greater than 15lbs), bending, straining. DO NOT blow your nose or pick at your nose for at least 2 weeks DO NOT take aspirin or  aspirin containing medications (Advil, Motrin, or any other NSAIDS) DO NOT fly without your doctor's clearance for 3-5 days after surgery DO NOT stop your prednisone until directed to do so by your surgeon.  Call Your Doctor Immediately If: Change in vision Increased swelling around the eyes Neck stiffness or deep head pain Continued Nausea or Vomiting Bright red blood that lasts more than ten minutes or causes choking Fever over 101 degrees     NASAL/SINUS IRRIGATIONS  It is required that you wash out your nose and sinus cavities with a saline solution. This is good in the post-operative period to flush out pus, crusts, and debris. In the long-term, this is also used to mechanically wash out infections. You can use the recipe below to make the irrigation solution or the packets that come with the [facility] Sinus Rinse squeeze bottle (see [facility] box instructions).  RECIPE:  1 quart boiled or distilled H2O  1 teaspoon canning/pickling/kosher salt (non-iodized)  1 teaspoon baking soda  Irrigate each nostril with 4oz [facility] squeeze bottle contains 8oz) of the above solution at least three times daily. While in the shower or leaning over a sink, aim the squeeze bottle (see figure 2) diagonally (away from the septum). The fluid will circulate in and out of your sinus cavities, coming back out the opposite nostril being irrigated. To accomplish this focus on making a "k" sound while you irrigate. This will close your palate so the irrigation does not wash out your mouth. The irrigations help to clean the clots from your nose and prevent scarring after surgery.   To view a video demonstration, please go to [email or web address]. Once on their home page, click the link on the left side of the screen reading, "[facility] Videos."  It may be convenient to mix larger quantities of the saline solution and store it in your refrigerator, warming up each days supply prior to use. Consider buying one gallon  of distilled water and adding 4 tsp of salt and 4 tsp of baking soda.         Post Anesthesia Home Care Instructions  Activity: Get plenty of rest for the remainder of the day. A responsible individual must stay with you for 24 hours following the procedure.  For the next 24 hours, DO NOT: -Drive a car -Advertising copywriter -Drink alcoholic beverages -Take any medication unless instructed by your physician -Make any legal decisions or sign important papers.  Meals: Start with liquid foods such as gelatin or soup. Progress to regular foods as tolerated. Avoid greasy, spicy, heavy foods. If nausea and/or vomiting occur, drink only clear liquids until the nausea and/or vomiting subsides. Call your physician if vomiting continues.  Special Instructions/Symptoms: Your throat may feel dry or sore from the anesthesia or the breathing tube placed in your throat during surgery. If this causes discomfort, gargle with warm salt water. The discomfort should disappear within 24 hours.  If you had a scopolamine patch placed behind your ear for the management of post- operative nausea and/or vomiting:  1. The medication in the patch is effective for 72 hours, after which it should be removed.  Wrap patch in a tissue and discard in the trash. Wash hands thoroughly with soap and  water. 2. You may remove the patch earlier than 72 hours if you experience unpleasant side effects which may include dry mouth, dizziness or visual disturbances. 3. Avoid touching the patch. Wash your hands with soap and water after contact with the patch.   No tylenol until after 1:15pm today if needed.

## 2023-10-24 NOTE — Transfer of Care (Signed)
Immediate Anesthesia Transfer of Care Note  Patient: Kalman Jewels  Procedure(s) Performed: Septoplasty/inferior turbinate reduction AND BALLOON SINUSPLASTY (Bilateral: Nose) Functional Endoscopic Sinus Surgery With Navigation (Bilateral: Nose)  Patient Location: PACU  Anesthesia Type:General  Level of Consciousness: awake, alert , and oriented  Airway & Oxygen Therapy: Patient Spontanous Breathing and Patient connected to face mask oxygen  Post-op Assessment: Report given to RN and Post -op Vital signs reviewed and stable  Post vital signs: Reviewed and stable  Last Vitals:  Vitals Value Taken Time  BP 112/59 10/24/23 1235  Temp    Pulse 82 10/24/23 1238  Resp 18 10/24/23 1238  SpO2 96 % 10/24/23 1238  Vitals shown include unfiled device data.  Last Pain:  Vitals:   10/24/23 0711  TempSrc: Temporal  PainSc: 0-No pain      Patients Stated Pain Goal: 3 (10/24/23 0711)  Complications: No notable events documented.

## 2023-10-24 NOTE — Anesthesia Postprocedure Evaluation (Signed)
Anesthesia Post Note  Patient: Lindsey Knight  Procedure(s) Performed: Septoplasty/inferior turbinate reduction AND BALLOON SINUSPLASTY (Bilateral: Nose) Functional Endoscopic Sinus Surgery With Navigation (Bilateral: Nose)     Patient location during evaluation: PACU Anesthesia Type: General Level of consciousness: awake and alert Pain management: pain level controlled Vital Signs Assessment: post-procedure vital signs reviewed and stable Respiratory status: spontaneous breathing, nonlabored ventilation, respiratory function stable and patient connected to nasal cannula oxygen Cardiovascular status: blood pressure returned to baseline and stable Postop Assessment: no apparent nausea or vomiting Anesthetic complications: no   No notable events documented.  Last Vitals:  Vitals:   10/24/23 1315 10/24/23 1320  BP: 123/74   Pulse: 88 84  Resp: 12 15  Temp:    SpO2: (!) 89% 98%    Last Pain:  Vitals:   10/24/23 1320  TempSrc:   PainSc: 3                  Kasyn Stouffer

## 2023-10-24 NOTE — Interval H&P Note (Signed)
History and Physical Interval Note:  10/24/2023 6:52 AM  Lindsey Knight  has presented today for surgery, with the diagnosis of Chronic sinusitis.  The various methods of treatment have been discussed with the patient and family. After consideration of risks, benefits and other options for treatment, the patient has consented to  Procedure(s): Possible Septoplasty/inferior turbinate reduction (Bilateral) Revision Functional Endoscopic Sinus Surgery With Navigation (Bilateral), Nasal Polyp Removal as a surgical intervention.  The patient's history has been reviewed, patient examined, no change in status, stable for surgery.  I have reviewed the patient's chart and labs.  Questions were answered to the patient's satisfaction.     Ashok Croon

## 2023-10-24 NOTE — Anesthesia Procedure Notes (Signed)
Procedure Name: Intubation Date/Time: 10/24/2023 9:54 AM  Performed by: Alvera Novel, CRNAPre-anesthesia Checklist: Patient identified, Emergency Drugs available, Suction available and Patient being monitored Patient Re-evaluated:Patient Re-evaluated prior to induction Oxygen Delivery Method: Circle System Utilized Preoxygenation: Pre-oxygenation with 100% oxygen Induction Type: IV induction Ventilation: Mask ventilation without difficulty Laryngoscope Size: Mac and 4 Grade View: Grade I Tube type: Oral Number of attempts: 1 Airway Equipment and Method: Stylet Placement Confirmation: ETT inserted through vocal cords under direct vision, positive ETCO2 and breath sounds checked- equal and bilateral Secured at: 22 cm Tube secured with: Tape Dental Injury: Teeth and Oropharynx as per pre-operative assessment

## 2023-10-24 NOTE — Op Note (Signed)
OPERATIVE REPORT   PREOPERATIVE DIAGNOSIS: 1. Chronic maxillary sinusitis, bilateral. 2. Chronic ethmoid sinusitis, bilateral. 3. Chronic frontal sinusitis, bilateral. 4. Diffuse nasal polyposis   POSTOPERATIVE DIAGNOSIS: 1. Chronic maxillary sinusitis, bilateral. 2. Chronic ethmoid sinusitis, bilateral. 3. Chronic frontal sinusitis, bilateral. 4. Diffuse nasal polyposis   PROCEDURE: 1. Endoscopic maxillary antrostomy, bilateral 2. Endoscopic total ethmoidectomy, bilateral. 3. Endoscopic frontal sinus ostium dilation with balloon, bilateral. 4. Nasal polypectomy 5. Placement of Propel Contour mometasone steroid eluding stents  6. Stereotactic CT navigation used for duration of the procedure   SURGEON: Ashok Croon, MD   ANESTHESIA: General endotracheal.   ESTIMATED BLOOD LOSS: less than 15 mL.   COMPLICATIONS: None   CONDITION: Stable to PACU.   INTRAOPERATIVE FINDINGS: 1. Evidence of chronic sinus disease with mucosal edema, thick mucus contents and fungal debris in bilateral maxillary sinuses  2. Diffuse bilateral polyposis      SPECIMEN: 1. Sinus contents from the left and right side   INDICATIONS AND CONSENT: 62 year old female who presented to the Otolaryngology clinic with a longstanding history of chronic sinus disease and nasal obstruction not responsive to maximal medical therapy. She had a history of prior sinus surgery done several years ago for chronic rhino sinusitis with nasal polyps. Examination including CT scan revealed evidence of chronic sinus disease, mucosal edema, and bilateral nasal polyps. They were therefore offered the aforementioned procedures. The procedure, risks, benefits, alternatives, potential complications, possible outcomes as well as the option of no treatment were reviewed with the patient who indicated they understood and wished to proceed forward with the procedure. Risks including but not limited to, the risk of anesthesia,  bleeding, infection, scarring, change in smell, orbital injury, that could result in vision loss, CSF leak that could result in intracranial injury, the possibility of recurrence of disease and need for additional procedures were discussed with the patient. Informed consent was obtained.   DESCRIPTION OF PROCEDURE: On 10/24/23, the patient was identified in the preoperative area, consent confirmed, and transported to the operating suite. They were then transferred to the operating room table and placed in a supine position. After induction of general endotracheal anesthesia, the bed was rotated 90 degrees and a proper surgeon initiated time out was performed. Stereotactic CT navigation system was connected at that time. It was tested and found to be working properly and accurately used throughout the duration of the case to allow assistance and identification of landmark structures.The nasal cavity was then decongested with Afrin bilaterally using Afrin soaked pledgets and the patient was then prepped and draped in the usual sterile fashion.   A combination of zero degree and angled endoscopes were used throughout the entirety of the case. We first began by inspecting the right and left nasal cavities and injecting the lateral nasal wall and middle turbinate with 1% lidocaine with 1:100,000 parts epinephrine. We then proceeded with our endoscopic sinus surgery. First, we removed diffuse bilateral nasal polyps that were reaching to the level of the inferior aspect of the middle turbinate bilaterally. This was done with a microdebrider. This maneuver revealed the anatomic landmarks needed to proceed to the next step.   The right middle turbinate was lateralized using a freer elevator. There was no evidence of the patent maxillary antrostomy and we proceeded with revision maxillary antrostomy. The remnants of the uncinate process were then identified with a ball tip probe, medialized and removed. The natural  maxillary ostium was then located lateral to the resected uncinate process. It  was enlarged in an anterior and inferior direction. Next, we turned our attention to the ethmoid sinuses. The remnant partitions and polypoid tissue were removed in the area of the posterior and anterior ethmoid sinuses. The right basal lamella was identified and served as a landmark. The posterior ethmoid cell partitions were removed in an anterior to posterior direction. At the conclusion of the revision total ethmoidectomy, all ethmoid cells spanning from the lamina papyracea laterally, the middle turbinate medially, the skull base superiorly, and anterior wall of the sphenoid sinus had been removed. These structures remained intact and were not violated. We then examined the maxillary sinus and noticed thick mucin and debris which were irrigated and suctioned out. A culture swab was also sent for bacterial and fungal culture. A frontal recess dissection was then performed by removing residual septations and edematous mucosa at the frontal outflow tract. We then performed balloon sinuplasty/dilation of the frontal sinus outflow tract using 6 mm x 16 mm frontal sinus balloon using image guidance, and placed Propel Contour mometasone steroid eluding stents into the frontal sinus ostium to optimize healing and keep the ostium patent. The right nasal cavity was then suctioned out and afrin soaked pledgets were placed for hemostasis in the middle meatus.    We next turned our attention to the left nasal cavity. The left maxillary antrostomy appeared to be partially patent once the nasal polyps were removed, but there was evidence of remnant uncinate process and edematous mucosa, and both were removed. There was evidence of fungal debris in the left maxillary sinuses, which we removed with irrigation and suctioning. We then performed revision total ethmoidectomy in a similar fashion to the right side. A frontal recess dissection was then  performed by removing residual septations and edematous mucosa/polyps at the frontal outflow tract. We then performed balloon sinuplasty/dilation of the frontal sinus outflow tract using 6 mm x 16 mm frontal sinus balloon using image guidance, and placed Propel Contour mometasone steroid eluding stents into the frontal sinus ostium to optimize healing and keep the ostium patent. We then irrigated the nose copiously and suctioned it out. We inspected the entire nasal cavity and nasopharynx and there was no evidence of retained foreign body or loose bony chips. We then placed Surgifoam into the middle meatus bilaterally to ensure patency in the immediate postoperative period.   This concluded our procedure. The patient tolerated the procedure well without any apparent immediate post operative complications. All counts were correct x2. The patient was rotated back to their original position, gently awakened from general anesthesia and taken to the PACU in stable condition.

## 2023-10-25 ENCOUNTER — Encounter (HOSPITAL_BASED_OUTPATIENT_CLINIC_OR_DEPARTMENT_OTHER): Payer: Self-pay | Admitting: Otolaryngology

## 2023-10-25 LAB — SURGICAL PATHOLOGY

## 2023-10-26 ENCOUNTER — Other Ambulatory Visit: Payer: Self-pay

## 2023-10-26 NOTE — Progress Notes (Signed)
Specialty Pharmacy Refill Coordination Note  Lindsey Knight is a 62 y.o. female contacted today regarding refills of specialty medication(s) Dupilumab   Patient requested [name] at [facility] at [place] date: 11/01/23   Medication will be filled on 10/31/23.

## 2023-10-29 LAB — AEROBIC/ANAEROBIC CULTURE W GRAM STAIN (SURGICAL/DEEP WOUND): Gram Stain: NONE SEEN

## 2023-10-31 ENCOUNTER — Other Ambulatory Visit: Payer: Self-pay

## 2023-10-31 ENCOUNTER — Ambulatory Visit: Payer: BC Managed Care – PPO | Admitting: Allergy

## 2023-11-01 ENCOUNTER — Telehealth: Payer: Self-pay | Admitting: *Deleted

## 2023-11-01 ENCOUNTER — Other Ambulatory Visit: Payer: Self-pay | Admitting: *Deleted

## 2023-11-01 ENCOUNTER — Other Ambulatory Visit (HOSPITAL_COMMUNITY): Payer: Self-pay

## 2023-11-01 DIAGNOSIS — K50919 Crohn's disease, unspecified, with unspecified complications: Secondary | ICD-10-CM

## 2023-11-01 NOTE — Telephone Encounter (Signed)
-----   Message from Nurse Kerrie Buffalo sent at 11/01/2023 10:06 AM EST -----  ----- Message ----- From: Avanell Shackleton, RN Sent: 10/30/2023  12:00 AM EST To: Chrystie Nose, RN  Labs in 6 months repeat.

## 2023-11-01 NOTE — Telephone Encounter (Signed)
Called patient to notify her labs are due. Patient understood and agreed.

## 2023-11-05 ENCOUNTER — Ambulatory Visit (INDEPENDENT_AMBULATORY_CARE_PROVIDER_SITE_OTHER): Payer: BC Managed Care – PPO | Admitting: Otolaryngology

## 2023-11-05 ENCOUNTER — Encounter (INDEPENDENT_AMBULATORY_CARE_PROVIDER_SITE_OTHER): Payer: Self-pay | Admitting: Otolaryngology

## 2023-11-05 VITALS — BP 155/72 | HR 82

## 2023-11-05 DIAGNOSIS — K219 Gastro-esophageal reflux disease without esophagitis: Secondary | ICD-10-CM | POA: Diagnosis not present

## 2023-11-05 DIAGNOSIS — J339 Nasal polyp, unspecified: Secondary | ICD-10-CM

## 2023-11-05 DIAGNOSIS — J3089 Other allergic rhinitis: Secondary | ICD-10-CM

## 2023-11-05 DIAGNOSIS — R0981 Nasal congestion: Secondary | ICD-10-CM | POA: Diagnosis not present

## 2023-11-05 DIAGNOSIS — H9313 Tinnitus, bilateral: Secondary | ICD-10-CM

## 2023-11-05 DIAGNOSIS — J329 Chronic sinusitis, unspecified: Secondary | ICD-10-CM

## 2023-11-05 DIAGNOSIS — H6993 Unspecified Eustachian tube disorder, bilateral: Secondary | ICD-10-CM

## 2023-11-05 DIAGNOSIS — R490 Dysphonia: Secondary | ICD-10-CM

## 2023-11-05 DIAGNOSIS — J324 Chronic pansinusitis: Secondary | ICD-10-CM

## 2023-11-05 NOTE — Progress Notes (Signed)
ENT Progress Note  Update 11/05/23:  1 week post-op following b/l revision FESS (b/l maxillary, b/l ethmoid and frontal sinus (balloon sinuplasty for frontal sinus + stent b/l)) and b/l nasal polyp removal. Doing well. No significant pain or epistaxis noted post-op, doing nasal rinses 3-4 times a day. No vision changes. No facial swelling.   Update 10/15/23:  Discussed the use of AI scribe software for clinical note transcription with the patient, who gave verbal consent to proceed.  History of Present Illness   The patient, with a history of chronic sinusitis and nasal polyps, presents for a preoperative visit. They have been experiencing persistent nasal congestion and postnasal drip, which they describe as a "gob of stuff" that they cough up in the mornings. The patient has been using budesonide nasal rinses, which have been somewhat effective in clearing the nasal passages. However, they have also noticed some nasal bleeding, which they attribute to the steroid rinses.  The patient has recently started on Dupixent, with three injections administered so far. Following the first injection, they experienced a week-long loss of voice, which they initially attributed to the medication. However, they now believe it may have been due to a concurrent viral infection, as similar symptoms were experienced by family members.  The patient also reports a history of reflux, for which they take Pepcid. They have noticed some improvement in their reflux symptoms with this medication. They also have a known hiatal hernia, which they believe may be contributing to their reflux symptoms.  The patient has previously undergone sinus surgery, which provided some relief from their chronic sinusitis symptoms. However, they continue to experience nasal congestion and postnasal drip, and have been noted to have persistent nasal polyps on examination.       Update 09/05/23 She returns for follow-up after imaging and  labs. Started budesonide rinses and on daily antihistamine. Saw allergy and was tested for allergies. Dupixent was recommended. Insurance pre-approval for Dupixent is pending. She completed sinus CT. She feels somewhat better currently. No acute sinus infections since last office visit.   Initial Evaluation:  Reason for Consult: chronic headaches and sinusitis on CT max/face    HPI: Lindsey Knight is an 62 y.o. female with hx of chronic nasal congestion, frequent sinus infections for several decades and chronic headaches, here for initial evaluation of her sx. She has a longstanding history of sinus infections and allergy symptoms dating back several decades ago.  She received allergy shots for years in [place], moved to [facility] and allergy testing was negative, then moved to [place], again allergy skin testing was negative per report . she then started to have recurrent sinus infections, required multiple courses of antibiotics and steroids. She then had sinus surgery 13 yrs ago, and after sinus surgery her sinus infections became less frequent.  She reports that her sinus surgeon did IgE levels at the time and IgA levels were low.  She is on Zyrtec daily and uses Atrovent spray and Flonase nasal spray BID. At night she takes Singulair. She uses albuterol when her symptoms worsen. She took abx and steroids after her most recent bout of sinus infection and currently feeling better.  Denies history of allergies to aspirin or NSAIDs.  She had CT max face/MRI brain ordered due to persistent headache. MRI brain was concerning for potential defect at cribriform plate and bilateral sinusitis.  CT max face was ordered in follow-up and it did not show a defect at the skull base, but demonstrated bilateral sinusitis  involving maxillary and ethmoid sinuses and evidence of prior sinus surgery.  Records Reviewed:  Office visit with Dr Lindsey Knight 05/18/23 Lindsey Knight is a 62 year old woman, never smoker with history of asthma  who returns to pulmonary clinic for cough.    She reports sinus drainage with post nasal drip and some sinus pressure over recent weeks. She has significant cough that can keep her up at night. She is using advair 250-75mcg 1 puff twice daily and as needed albuterol. She is using flonase 1 spray per nostril daily. She continues to have reflux symptoms despite famotidine 20mg  twice daily.    OV 10/09/22 She reports increased cough over the past 2 months. She describes an event where she was bending over in the community garden and possibly had a significant reflux episode. She denies any viral illnesses recently.    She saw GI 08/01/22 with plan to reduce PPI dosing then transition to famotidine for 2 weeks then to use as needed. She reports the famotidine did not help, she reports burning sensation in her chest and throat. She then started omeprazole OTC which seems to have reduced her reflux symptoms.    She has advair 250-22mcg 1 puff twice daily that she has been using as needed. She has albuterol as needed as well. She has not felt much relief.    She has not been on steroids or antibiotics.    OV 03/06/22 Chest x-ray today shows clearing of the right upper lobe infiltrates. Will await final radiology read.    She is using advair 100-50 mcg 1 puff twice daily as needed along with daily allegra and montelukast for her asthma. No night time awakenings. No issues with spring allergies.   She has been feeling well since last visit. He cough has significantly decreased. She recently had EGD and C-scope and started on PPI therapy for GERD.    OV 01/03/22 Patient reports having respiratory illness in mid-December 2022 and was treated with Zpak on 12/13. PCP note from 11/08/2021 reviewed. Chest x-ray on 12/30 showed increased lung markings with mild right mid lung and left basilar linear scaring or atelectasis. She was also noted to have increased platelet count. She had CT Chest on 11/29/21 that showed  mediastinal, axillary and hilar lymphadenopathy along with patchy ground-glass and adjacent focal clustered nodularity in the right upper lobe and adjacent band like atelectasis/scarring at the junction of the right upper lobe.    Patient reports resolution of her infectious symptoms from December and is feeling at her baseline. She will occasionally have some right sided discomfort but it is overall improved from December. She notices the pain when laying down or moving when she sleeps. She denies any shortness of breath, fevers, chills or night sweats. She denies hemoptysis. She does have joint aches in general which include her knees, hips and occasional hand stiffness in the mornings.   She has history of asthma and mainly has issues with reactive airways disease when triggered by viral infections and uses inhalers as needed. She denies issues with seasonal allergies. She had sinus surgery in the past with significant reduction in frequency of sinus infections.    She is a never smoker. She is a retired Engineer, civil (consulting). She has a sister with crohn's disease and her mother had colon cancer.        Office note by Jarold Motto PA Pt c/o headache x 1 week on right side of head behind ear. Took 2 extra strength Tylenol  last night with relief. Has pain now  Headache She has been having an intermittent headache since 06/13/23.  She is taking extra strength Tylenol morning and evening with some relief. Took Sudafed two days ago without relief. Pain is located behind right ear. Rates pain 7/10 and describes as sharp throbbing pain. No change in lifestyle, eating, stress. Denies any recent head trauma. This kind of headache is unusual for her. She is not a "headache" person. Treated recently for maxillary sinusitis on 05/18/23.   Feels somewhat disoriented, especially on the right side of her vision field. 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 She has been taking alendronate for one  year - unsure if this is contributing to her symptom(s).    No family history of stroke. Denies chest pain, shortness of breath. No change in bedding, pillows. Has not had her yearly eye exam for this year.   Denies rash. Had bilateral nasal sinus surgery in 2015.  Denies sinus pressure, flu symptoms.   New daily persistent headache Neuro exam is wnl No red flags on my exam Will obtain MR without contrast to further evaluate given persistence and age >17 If new/worsening symptom(s), likely will need to go to the ER Consider neurology referral    Past Medical History:  Diagnosis Date   Allergy Child   Anemia    Arthritis    Asthma    Eczema    GERD (gastroesophageal reflux disease) 10/22   Hiatal hernia    Osteoporosis    Torn ligament    pt reports torn ligament in right ankle, use caution when turning   Ulcer 6/23   Vaginal delivery    x 2, 1985, 1990    Past Surgical History:  Procedure Laterality Date   COLONOSCOPY  01/23/2022   normal - 2012   LIPOMA EXCISION Right 03/27/2023   back   NASAL SEPTOPLASTY W/ TURBINOPLASTY Bilateral 10/24/2023   Procedure: Septoplasty/inferior turbinate reduction AND BALLOON SINUSPLASTY;  Surgeon: Ashok Croon, MD;  Location: [facility];  Service: ENT;  Laterality: Bilateral;   NASAL SINUS SURGERY Bilateral 2015   OTHER SURGICAL HISTORY     lipoma removed from back 03/2023   SINUS ENDO W/FUSION Bilateral 10/24/2023   Procedure: Functional Endoscopic Sinus Surgery With Navigation;  Surgeon: Ashok Croon, MD;  Location: [facility];  Service: ENT;  Laterality: Bilateral;    Family History  Problem Relation Age of Onset   Colon cancer Mother    Cancer Mother    Hearing loss Mother    Heart failure Father    Asthma Father    COPD Father    Hearing loss Father    Crohn's disease Sister    Pancreatic cancer Other    Ovarian cancer Other    Uterine cancer Other    Allergic rhinitis Grandson     Food Allergy Grandson    Asthma Daughter     Social History:  reports that she has never smoked. She has never been exposed to tobacco smoke. She has never used smokeless tobacco. She reports that she does not currently use alcohol. She reports that she does not currently use drugs after having used the following drugs: Marijuana.  Allergies:  Allergies  Allergen Reactions   Other Hives    Adhesives   Tape Rash    Medications: I have reviewed the patient's current medications.  The PMH, PSH, Medications, Allergies, and SH were reviewed and updated.  ROS: Constitutional: Negative for  fever, weight loss and weight gain. Cardiovascular: Negative for chest pain and dyspnea on exertion. Respiratory: Is not experiencing shortness of breath at rest. Gastrointestinal: Negative for nausea and vomiting. Neurological: Negative for headaches. Psychiatric: The patient is not nervous/anxious  Blood pressure (!) 155/72, pulse 82, SpO2 96%.  PHYSICAL EXAM:  Exam: PHYSICAL EXAM:  Exam: General: Well-developed, well-nourished Communication and Voice: Clear pitch and clarity Respiratory Respiratory effort: Equal inspiration and expiration without stridor Cardiovascular Peripheral Vascular: Warm extremities with equal color/perfusion Eyes: No nystagmus with equal extraocular motion bilaterally Neuro/Psych/Balance: Patient oriented to person, place, and time; Appropriate mood and affect; Gait is intact with no imbalance; Cranial nerves I-XII are intact Head and Face Inspection: Normocephalic and atraumatic without mass or lesion Palpation: Facial skeleton intact without bony stepoffs Salivary Glands: No mass or tenderness Facial Strength: Facial motility symmetric and full bilaterally ENT Pinna: External ear intact and fully developed External canal: Canal is patent with intact skin Tympanic Membrane: Clear and mobile External Nose: No scar or anatomic deformity Internal Nose:  Crusting/dry clot/nasal secretions along middle meatus, debrided b/l, edema along the maxillary antrostomy b/l, no significant purulence noted   Bilateral inferior turbinate hypertrophy present.  Lips, Teeth, and gums: Mucosa and teeth intact and viable TMJ: No pain to palpation with full mobility Oral cavity/oropharynx: No erythema or exudate, no lesions present Nasopharynx: No mass or lesion with intact mucosa   Procedure:  Preoperative diagnosis: dysphonia and voice changes/fluctuations/hoarseness  Postoperative diagnosis:   Same + GERD LPR  Procedure: Flexible fiberoptic laryngoscopy  Surgeon: Ashok Croon, MD  Anesthesia: Topical lidocaine and Afrin Complications: None Condition is stable throughout exam  Indications and consent:  The patient presents to the clinic with Indirect laryngoscopy view was incomplete. Thus it was recommended that they undergo a flexible fiberoptic laryngoscopy. All of the risks, benefits, and potential complications were reviewed with the patient preoperatively and verbal informed consent was obtained.  Procedure: The patient was seated upright in the clinic. Topical lidocaine and Afrin were applied to the nasal cavity. After adequate anesthesia had occurred, I then proceeded to pass the flexible telescope into the nasal cavity. The nasal cavity was patent without rhinorrhea or polyp. The nasopharynx was also patent without mass or lesion. The base of tongue was visualized and was normal. There were no signs of pooling of secretions in the piriform sinuses. The true vocal folds were mobile bilaterally. There were no signs of glottic or supraglottic mucosal lesion or mass. There was moderate interarytenoid pachydermia and post cricoid edema. The telescope was then slowly withdrawn and the patient tolerated the procedure throughout.    PROCEDURE NOTE: nasal endoscopy with post-op debridement   Preoperative diagnosis: chronic sinusitis with nasal polyps  and hx of FESS  Postoperative diagnosis: same  Surgeon: Ashok Croon, M.D.  Anesthesia: Topical lidocaine and Afrin  H&P REVIEW: The patient's history and physical were reviewed today prior to procedure. All medications were reviewed and updated as well. Complications: None Condition is stable throughout exam Indications and consent: The patient presents with symptoms of chronic sinusitis not responding to previous therapies. All the risks, benefits, and potential complications were reviewed with the patient preoperatively and informed consent was obtained. The time out was completed with confirmation of the correct procedure.   Procedure: The patient was seated upright in the clinic. Topical lidocaine and Afrin were applied to the nasal cavity. After adequate anesthesia had occurred, the rigid nasal endoscope was passed into the nasal cavity. The nasal mucosa, turbinates,  septum, and sinus drainage pathways were visualized bilaterally. Post-operative changes were noted with significant crusting along middle meatus, which was removed using a combination of suction and Blakesley forceps. There was some erythema and edema noted b/l. The mucosa was intact. The scope was then slowly withdrawn and the patient tolerated the procedure well. There were no complications or blood loss.    Studies Reviewed:CT max/face CLINICAL DATA:  Sinus mass on recent MRI.   EXAM: CT sinuses 08/22/23   FINDINGS: Paranasal sinuses:   Frontal: Persistent, near complete opacification bilaterally with associated chronic osteitis.   Ethmoid: Moderate residual air cell opacification bilaterally, predominantly involving anterior air cells and overall greatly improved from prior (particularly posteriorly).   Maxillary: Prominent circumferential, mildly nodular/polypoid mucosal thickening bilaterally with associated partial mineralization of sinus contents and with chronic osteitis. Improved aeration from  prior.   Sphenoid: Minimal mucosal thickening and possible trace fluid bilaterally, improved from prior.   Right ostiomeatal unit: Patent maxillary antrostomy.   Left ostiomeatal unit: Patent maxillary antrostomy.   Nasal passages: Bilateral middle turbinate concha bullosa. Residual mucosal thickening primarily in the anterosuperior aspect of the nasal cavity, improved from prior. Essentially midline nasal septum.   Anatomy: No pneumatization superior to anterior ethmoid notches. Keros II with the left fovea ethmoidalis again noted to be higher than on the right. Sellar sphenoid pneumatization pattern. No dehiscence of carotid or optic canals. No onodi cell.   Other: Clear mastoid air cells and middle ear cavities. Unremarkable appearance of the orbits and included portion of the brain.   IMPRESSION: Overall improved pansinusitis although with persistent near complete opacification of the frontal sinuses.  CT MAXILLOFACIAL WITH CONTRAST 07/17/23   TECHNIQUE: Multidetector CT imaging of the maxillofacial structures was performed with intravenous contrast. Multiplanar CT image reconstructions were also generated.   RADIATION DOSE REDUCTION: This exam was performed according to the departmental dose-optimization program which includes automated exposure control, adjustment of the mA and/or kV according to patient size and/or use of iterative reconstruction technique.   CONTRAST:  75mL OMNIPAQUE IOHEXOL 300 MG/ML  SOLN   COMPARISON:  Brain MRI 07/10/2023   FINDINGS: Osseous: No fracture or destructive changes   Orbits: Unremarkable   Sinuses: Essentially confluent opacification of maxillary, ethmoid, and frontal sinuses bilaterally with diffuse obstruction of the upper nasal cavity by mucosal thickening. Partially mineralized debris is seen in the maxillary sinuses and extending towards the widened ostia. No clear destructive changes, asymmetry at the left cribriform  plate on prior brain MRI is likely due to the higher left fovea ethmoidalis, a normal variant. No invasive features are seen. The nasal septum is nearly midline.   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 No destructive or invasive features.  MRI Brain EXAM: MRI HEAD WITHOUT CONTRAST   TECHNIQUE: Multiplanar, multiecho pulse sequences of the brain and surrounding structures were obtained without intravenous contrast.   COMPARISON:  None Available.   FINDINGS: Brain: Negative for acute infarct. Small microhemorrhages in the inferomedial right frontal and left frontal lobe. No hydrocephalus. No extra-axial fluid collection. No mass effect. No mass lesion. Sequela of mild chronic microvascular ischemic change.   Vascular: Normal flow voids.   Skull and upper cervical spine: Normal marrow signal.   Sinuses/Orbits: No middle ear or mastoid effusion. There is near-complete opacification of the bilateral maxillary, ethmoid, and frontal sinuses. There is partial opacification of the bilateral sphenoid sinuses. The cribriform plate appears somewhat  irregular and there appears to be mass effect on the olfactory sulcus (series 117, image 63). The integrity of the lamina papyracea on the left is also uncertain (series 117, image 56).   Other: None.   IMPRESSION: 1. No acute intracranial abnormality. 2. Near-complete opacification of the bilateral maxillary, ethmoid, and frontal sinuses. The cribriform plate appears somewhat irregular and there appears to be mass effect on the olfactory sulcus. The integrity of the lamina papyracea on the left is also uncertain. Recommend further evaluation with a dedicated contrast-enhanced maxillofacial CT.  Assessment/Plan: Encounter Diagnoses  Name Primary?   Chronic pansinusitis Yes   Nasal polyps    Sinusitis with nasal polyps     Environmental and seasonal allergies    Chronic nasal congestion    Tinnitus of both ears    Dysfunction of both eustachian tubes       Chronic sinusitis with nasal polyps + septal deviation and inferior turbinate hypertrophy - s/p FESS several years ago, with sx recurrence and evidence of severe inflammation bilateral maxillary and ethmoid sinuses -Initially had MRI brain which showed chronic sinus disease and potential irregularity at cribriform plat, but max face CT done following MRI brain did not reveal any bony defects in the skull base including area of concern along the cribriform plate -I discussed findings of both imaging studies with the patient -Based on my evaluation today including nasal endoscopy she has evidence of polyp regrowth and chronic nasal congestion along with septal deviation inferior turban hypertrophy and purulent secretions along the left middle meatus consistent with chronic rhinosinusitis with nasal polyps Plan  - she had recent CBC with diff, will order total IgE - CT sinuses brainlab protocol - CT max face with fewer images - need for image guidance during surgery  - nasal steroid rinses with [facility] [facility] nasal saline/mometasone to shrink the polyps and help with sx post-op -  Doxycycline 100 mg BID for sinus infection and Medrol Dose pack (4 mg tab pack, take as directed)  - Allergy referral for consideration to receive biologic such as Dupixent vs Xolair (hx of asthma) - I discussed management of CRSwNP and importance of surgical options and medical management of allergies and chronic inflammation - we discussed risks and benefits of surgery and she would like to proceed - will schedule for revision sinus surgery (bilateral revision FESS, maxillary antrostomy b/l, vs balloon sinuplasty, bilateral total ethmoidectomy, bilateral nasal polypectomy, possible inferior turbinate reduction, possible septoplasty) -She reports history of normal IgA when she had her initial  [place], she inquired about the significance of the test result, I advised her to have immunoglobulin levels checked with an allergist, who can better discussed the significance of it if her levels are abnormal  2. Environmental allergies and hx of asthma -Continue Zyrtec 10 mg daily -Stop Flonase after initiation of nasal steroid rinses -Continue Singulair  3. Asthma  -Continue albuterol as needed -Continue Singulair 10 mg daily -See pulmonary as needed established with Melody Comas, MD   Update 09/05/23 She saw Allergy, and had testing done. She had CT done. She was allergic to aspergillus and a couple of weeds on allergy testing with Dr Selena Batten. Being considered for Dupixent.   Chronic sinusitis with nasal polyps + septal deviation and inferior turbinate hypertrophy - s/p FESS several years ago, with sx recurrence and evidence of severe inflammation bilateral maxillary and ethmoid sinuses on CT max/face a few months ago.  -Initially had MRI  brain which showed chronic sinus disease and potential irregularity at cribriform plat, but max face CT done following MRI brain did not reveal any bony defects in the skull base including area of concern along the cribriform plate -I discussed findings of both imaging studies with the patient -Based on nasal endoscopy during initial office visit she has evidence of polyp regrowth and chronic nasal congestion along with septal deviation inferior turbinate hypertrophy and purulent secretions along the left middle meatus consistent with chronic rhinosinusitis with nasal polyps - took abx and steroids and repeat CT sinuses was done - which showed overall improved paranasal sinus disease with persistent opacification of frontal sinuses partial opacification of maxillary and ethmoid sinuses bilaterally -I reviewed imaging findings with the patient and we will plan for repeat nasal endoscopy in a few weeks prior to  her scheduled sinus procedure - She had elevated total IgE   Plan  - continue nasal steroid rinses with [employer] [facility] nasal saline/mometasone to shrink the polyps and help with sx post-op - proceed with Dupixent if able to get insurance approval - I discussed management of CRSwNP and importance of surgical options and medical management of allergies and chronic inflammation - we discussed risks and benefits of surgery and she would like to proceed - will plan for revision sinus surgery (based on imaging results we will plan for bilateral maxillary sinus lavage, removal of the residual ethmoid partitions and frontal balloon sinuplasty and inferior turbinate reduction.  -She reports history of normal IgA when she had her initial work[place], she inquired about the significance of the test result, I advised her to have immunoglobulin levels checked with an allergist, who can better discussed the significance of it if her levels are abnormal  2. Environmental allergies and hx of asthma -Continue Zyrtec 10 mg daily -continue nasal steroid rinses with Budesonide -Continue Singulair - initiate Dupixent if a candidate and approved by insurance  3. Asthma  -Continue albuterol as needed -Continue Singulair 10 mg daily -See pulmonary as needed established with Melody Comas, MD  RTC in a few weeks, will plan repeat nasal endoscopy and will discuss details of planned surgery.  Update 10/15/23 Assessment and Plan    Chronic Sinusitis with Nasal Polyps Chronic sinusitis with nasal polyps, previously treated with surgery. Current symptoms include mucus retention, nasal congestion, and polyps observed on examination/CT scan. 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 Dupixent initiated with three injections so far. Plan for less extensive surgery to remove partitions, wash out maxillary sinuses, and  address frontal sinuses. Discussed benefits (improved drainage, reduced symptoms), risks, and recovery expectations (shorter than previous surgery). Shared decision-making regarding procedure and continuation of Dupixent. Discussed postoperative care including sinus precautions and minimal packing. - Proceed with revision FESS  - Continue Dupixent injections - Provide postoperative care instructions including sinus precautions and minimal packing - Schedule follow-up appointment on November 05, 2023  Dysphonia and sx of Laryngitis after initial Dupixent shot, voice loss for a few days, likely due to concurrent viral infection vs reflux laryngitis with symptoms of voice changes and throat irritation. No evidence of bacterial laryngitis. Currently takes Pepcid. Recommended a natural supplement to complement Pepcid and reduce reflux symptoms. - Continue Pepcid 20 mg daily  - Recommend Reflux Gourmet supplement to be taken after meals and before bedtime - diet and lifestyle changes to minimize reflux   Follow-up - Follow-up appointment on November 05, 2023 postop - Follow-up with allergist on October 31, 2023.    I spent 30 minutes in total face-to-face time and in reviewing records during pre-charting, more than 50% of which was spent in counseling and coordination of care, reviewing test results, reviewing medications and treatment regimen and/or in discussing or reviewing the diagnosis, the prognosis and treatment options. Pertinent laboratory and imaging test results that were available during this visit with the patient were reviewed by me and considered in my medical decision making (see chart for details).   Update 11/05/23 2 weeks post-op revision FESS and removal of nasal polyps, doing well   Assessment and Plan     Chronic Sinusitis with Nasal Polyps Postoperative care following sinus surgery with stent placement in the frontal outflow tract. Significant swelling, mucin, and polyps  present during the case. Improved breathing with occasional dark, dry blood discharge post-op per report. Crusting observed on nasal endoscopy today and debridement was performed. Culture showed Serratia species (few cells) responsive to all tested abx. Discussed continued nasal irrigation to prevent crusting and promote healing. Potential addition of antibiotic and steroid to the rinse if crusting persists. We discussed that stents elute steroids for about thirty days and dissolve with rinses.  - Continue nasal rinses three/four times daily - Consider adding a few drops of baby shampoo to the rinse to loosen the crusting - Prescribe budesonide and antibiotic rinse if significant crusting persists at next visit - Schedule follow-up appointment after Christmas, either on Thursday, November 15, 2023, or the following week  Tinnitus Intermittent high-pitched ringing in the ears, possibly due to age-related hearing loss, eustachian tube dysfunction, or nasal congestion post-surgery. No ear wax or fluid observed on exam. - Order hearing test.      RTC 2 weeks     Ashok Croon, MD Otolaryngology [facility] Phone: [phone] Fax: [phone]    11/05/2023, 2:34 PM

## 2023-11-07 ENCOUNTER — Other Ambulatory Visit (INDEPENDENT_AMBULATORY_CARE_PROVIDER_SITE_OTHER): Payer: BC Managed Care – PPO

## 2023-11-07 DIAGNOSIS — K50919 Crohn's disease, unspecified, with unspecified complications: Secondary | ICD-10-CM | POA: Diagnosis not present

## 2023-11-07 LAB — CBC WITH DIFFERENTIAL/PLATELET
Basophils Absolute: 0.1 10*3/uL (ref 0.0–0.1)
Basophils Relative: 0.9 % (ref 0.0–3.0)
Eosinophils Absolute: 0.2 10*3/uL (ref 0.0–0.7)
Eosinophils Relative: 1.9 % (ref 0.0–5.0)
HCT: 38.4 % (ref 36.0–46.0)
Hemoglobin: 13.1 g/dL (ref 12.0–15.0)
Lymphocytes Relative: 40.9 % (ref 12.0–46.0)
Lymphs Abs: 3.9 10*3/uL (ref 0.7–4.0)
MCHC: 34 g/dL (ref 30.0–36.0)
MCV: 89.8 fL (ref 78.0–100.0)
Monocytes Absolute: 0.7 10*3/uL (ref 0.1–1.0)
Monocytes Relative: 7.5 % (ref 3.0–12.0)
Neutro Abs: 4.6 10*3/uL (ref 1.4–7.7)
Neutrophils Relative %: 48.8 % (ref 43.0–77.0)
Platelets: 351 10*3/uL (ref 150.0–400.0)
RBC: 4.27 Mil/uL (ref 3.87–5.11)
RDW: 13.4 % (ref 11.5–15.5)
WBC: 9.4 10*3/uL (ref 4.0–10.5)

## 2023-11-07 LAB — IBC + FERRITIN
Ferritin: 72.1 ng/mL (ref 10.0–291.0)
Iron: 38 ug/dL — ABNORMAL LOW (ref 42–145)
Saturation Ratios: 14.3 % — ABNORMAL LOW (ref 20.0–50.0)
TIBC: 266 ug/dL (ref 250.0–450.0)
Transferrin: 190 mg/dL — ABNORMAL LOW (ref 212.0–360.0)

## 2023-11-12 ENCOUNTER — Ambulatory Visit (INDEPENDENT_AMBULATORY_CARE_PROVIDER_SITE_OTHER): Payer: BC Managed Care – PPO | Admitting: Audiology

## 2023-11-12 ENCOUNTER — Other Ambulatory Visit: Payer: Self-pay | Admitting: *Deleted

## 2023-11-12 DIAGNOSIS — K50919 Crohn's disease, unspecified, with unspecified complications: Secondary | ICD-10-CM

## 2023-11-12 DIAGNOSIS — D509 Iron deficiency anemia, unspecified: Secondary | ICD-10-CM

## 2023-11-12 DIAGNOSIS — H903 Sensorineural hearing loss, bilateral: Secondary | ICD-10-CM | POA: Diagnosis not present

## 2023-11-12 NOTE — Progress Notes (Signed)
  [address], [address] [place], [place] [address] [phone]  Audiological Evaluation    Name: Lindsey Knight     DOB:   Feb 22, 1961      MRN:   [id number]                                                                                     Service Date: 11/12/2023     Accompanied by: unaccompanied    Patient comes today after Dr. Irene Pap, ENT sent a referral for a hearing evaluation due to concerns with tinnitus.   Symptoms Yes Details  Hearing loss  []    Tinnitus  [x]  Both ears, high pitched, reports that it does not necessary affect her sleep , but hears it almost  all the time.  Ear pain/ Ear infections  []    Balance problems  []    Noise exposure  []    Previous ear surgeries  []    Family history  [x]  Parents, with age  Amplification  []    Other  []      Otoscopy: Right ear: Minimal wax in the external ear canals and notable landmarks visualized on the tympanic membrane. Left ear:  Clear external ear canals and notable landmarks visualized on the tympanic membrane.  Tympanometry: Right ear: Type A- Normal external ear canal volume with normal middle ear pressure and tympanic membrane compliance. Left ear: Type A- Normal external ear canal volume with normal middle ear pressure and tympanic membrane compliance.   Pure tone Audiometry: Right ear- Normal hearing from [phone], then mild to moderate presumably sensorineural 6000-8000 Hz . Left ear-  Normal hearing from [phone] Hz, then mild to moderate presumably sensorineural hearing loss from 4000 Hz - 8000 Hz.  The hearing test results were completed under headphones and re-checked with inserts and results are deemed to be of good reliability. Test technique:  conventional     Speech Audiometry: Right ear- Speech Reception Threshold (SRT) was obtained at 10 dBHL Left ear-Speech Reception Threshold (SRT) was obtained at 15 dBHL   Word Recognition Score Tested using NU-6 (MLV) Right ear: 92% was obtained at a  presentation level of 60 dBHL with contralateral masking which is deemed as  excellent Left ear: 96% was obtained at a presentation level of 60 dBHL with contralateral masking which is deemed as  excellent    Impression: There is difference in pure-tone thresholds between ears, worse in the left ear mainly at 4000 Hz. There is not a significant difference in the word recognition score in between ears.    Recommendations: Follow up with ENT as scheduled for today. Return for a hearing evaluation if concerns with hearing changes arise or per MD recommendation. Consider various tinnitus strategies, including the use of a sound generator, hearing aids, and/or tinnitus retraining therapy. Consider [facility] if masking techniques discussed today do not work for her.   Lindsey Knight MARIE LEROUX-MARTINEZ, AUD

## 2023-11-15 ENCOUNTER — Ambulatory Visit (INDEPENDENT_AMBULATORY_CARE_PROVIDER_SITE_OTHER): Payer: BC Managed Care – PPO | Admitting: Otolaryngology

## 2023-11-15 ENCOUNTER — Encounter (INDEPENDENT_AMBULATORY_CARE_PROVIDER_SITE_OTHER): Payer: Self-pay | Admitting: Otolaryngology

## 2023-11-15 VITALS — BP 134/75 | HR 84

## 2023-11-15 DIAGNOSIS — J342 Deviated nasal septum: Secondary | ICD-10-CM

## 2023-11-15 DIAGNOSIS — R0981 Nasal congestion: Secondary | ICD-10-CM | POA: Diagnosis not present

## 2023-11-15 DIAGNOSIS — J3089 Other allergic rhinitis: Secondary | ICD-10-CM

## 2023-11-15 DIAGNOSIS — H903 Sensorineural hearing loss, bilateral: Secondary | ICD-10-CM

## 2023-11-15 DIAGNOSIS — J329 Chronic sinusitis, unspecified: Secondary | ICD-10-CM | POA: Diagnosis not present

## 2023-11-15 DIAGNOSIS — J339 Nasal polyp, unspecified: Secondary | ICD-10-CM | POA: Diagnosis not present

## 2023-11-15 DIAGNOSIS — J385 Laryngeal spasm: Secondary | ICD-10-CM

## 2023-11-15 DIAGNOSIS — H9313 Tinnitus, bilateral: Secondary | ICD-10-CM | POA: Diagnosis not present

## 2023-11-15 DIAGNOSIS — J324 Chronic pansinusitis: Secondary | ICD-10-CM

## 2023-11-15 DIAGNOSIS — J343 Hypertrophy of nasal turbinates: Secondary | ICD-10-CM

## 2023-11-15 MED ORDER — PREDNISONE 10 MG PO TABS: 10.0000 mg | ORAL_TABLET | Freq: Every day | ORAL | 0 refills | Status: DC

## 2023-11-15 NOTE — Progress Notes (Signed)
ENT Progress Note  Update 11/15/23 Discussed the use of AI scribe software for clinical note transcription with the patient, who gave verbal consent to proceed.  History of Present Illness   The patient, with a history of chronic sinusitis with nasal polyps, presents for post-op f/u (had revision FESS 3 weeks ago). They report using baby shampoo for nasal irrigation, which has resulted in increased discharge. However, they also note a sensation of stuffiness, particularly in the upper nasal region, which is worse upon waking and has led to difficulty breathing through the nose. The patient has been performing nasal rinses three times daily, but feels that the irrigation is not reaching the upper nasal passages due to blockage.   In addition to the nasal symptoms, the patient has experienced an episode of choking, which they describe as a sudden inability to breathe or speak, occurring while not eating or drinking. This resolved spontaneously but was distressing for the patient.  The patient also mentions a recent injury to a finger (right ring finger), which has become increasingly swollen and bruised over the past two weeks. They are unsure if this is related to their current medication, Dupixent, which they self-administer for an unspecified condition. The patient has been on Dupixent for less than a month.    Update 11/05/23:  1 week post-op following b/l revision FESS (b/l maxillary, b/l ethmoid and frontal sinus (balloon sinuplasty for frontal sinus + stent b/l)) and b/l nasal polyp removal. Doing well. No significant pain or epistaxis noted post-op, doing nasal rinses 3-4 times a day. No vision changes. No facial swelling.   Update 10/15/23:  Discussed the use of AI scribe software for clinical note transcription with the patient, who gave verbal consent to proceed.  History of Present Illness   The patient, with a history of chronic sinusitis and nasal polyps, presents for a preoperative  visit. They have been experiencing persistent nasal congestion and postnasal drip, which they describe as a "gob of stuff" that they cough up in the mornings. The patient has been using budesonide nasal rinses, which have been somewhat effective in clearing the nasal passages. However, they have also noticed some nasal bleeding, which they attribute to the steroid rinses.  The patient has recently started on Dupixent, with three injections administered so far. Following the first injection, they experienced a week-long loss of voice, which they initially attributed to the medication. However, they now believe it may have been due to a concurrent viral infection, as similar symptoms were experienced by family members.  The patient also reports a history of reflux, for which they take Pepcid. They have noticed some improvement in their reflux symptoms with this medication. They also have a known hiatal hernia, which they believe may be contributing to their reflux symptoms.  The patient has previously undergone sinus surgery, which provided some relief from their chronic sinusitis symptoms. However, they continue to experience nasal congestion and postnasal drip, and have been noted to have persistent nasal polyps on examination.      Records reviewed:  Update 09/05/23 She returns for follow-up after imaging and labs. Started budesonide rinses and on daily antihistamine. Saw allergy and was tested for allergies. Dupixent was recommended. Insurance pre-approval for Dupixent is pending. She completed sinus CT. She feels somewhat better currently. No acute sinus infections since last office visit.   Initial Evaluation:  Reason for Consult: chronic headaches and sinusitis on CT max/face    HPI: Lindsey Knight is an 62 y.o. female  with hx of chronic nasal congestion, frequent sinus infections for several decades and chronic headaches, here for initial evaluation of her sx. She has a longstanding history of  sinus infections and allergy symptoms dating back several decades ago.  She received allergy shots for years in [place], moved to [facility] and allergy testing was negative, then moved to [place], again allergy skin testing was negative per report . she then started to have recurrent sinus infections, required multiple courses of antibiotics and steroids. She then had sinus surgery 13 yrs ago, and after sinus surgery her sinus infections became less frequent.  She reports that her sinus surgeon did IgE levels at the time and IgA levels were low.  She is on Zyrtec daily and uses Atrovent spray and Flonase nasal spray BID. At night she takes Singulair. She uses albuterol when her symptoms worsen. She took abx and steroids after her most recent bout of sinus infection and currently feeling better.  Denies history of allergies to aspirin or NSAIDs.  She had CT max face/MRI brain ordered due to persistent headache. MRI brain was concerning for potential defect at cribriform plate and bilateral sinusitis.  CT max face was ordered in follow-up and it did not show a defect at the skull base, but demonstrated bilateral sinusitis involving maxillary and ethmoid sinuses and evidence of prior sinus surgery.  Office visit with Dr Derek Jack 05/18/23 [name] is a 63 year old woman, never smoker with history of asthma who returns to pulmonary clinic for cough.    She reports sinus drainage with post nasal drip and some sinus pressure over recent weeks. She has significant cough that can keep her up at night. She is using advair 250-23mcg 1 puff twice daily and as needed albuterol. She is using flonase 1 spray per nostril daily. She continues to have reflux symptoms despite famotidine 20mg  twice daily.    OV 10/09/22 She reports increased cough over the past 2 months. She describes an event where she was bending over in the community garden and possibly had a significant reflux episode. She denies any viral illnesses recently.     She saw GI 08/01/22 with plan to reduce PPI dosing then transition to famotidine for 2 weeks then to use as needed. She reports the famotidine did not help, she reports burning sensation in her chest and throat. She then started omeprazole OTC which seems to have reduced her reflux symptoms.    She has advair 250-59mcg 1 puff twice daily that she has been using as needed. She has albuterol as needed as well. She has not felt much relief.    She has not been on steroids or antibiotics.    OV 03/06/22 Chest x-ray today shows clearing of the right upper lobe infiltrates. Will await final radiology read.    She is using advair 100-50 mcg 1 puff twice daily as needed along with daily allegra and montelukast for her asthma. No night time awakenings. No issues with spring allergies.   She has been feeling well since last visit. He cough has significantly decreased. She recently had EGD and C-scope and started on PPI therapy for GERD.    OV 01/03/22 Patient reports having respiratory illness in mid-December 2022 and was treated with Zpak on 12/13. PCP note from 11/08/2021 reviewed. Chest x-ray on 12/30 showed increased lung markings with mild right mid lung and left basilar linear scaring or atelectasis. She was also noted to have increased platelet count. She had CT Chest  on 11/29/21 that showed mediastinal, axillary and hilar lymphadenopathy along with patchy ground-glass and adjacent focal clustered nodularity in the right upper lobe and adjacent band like atelectasis/scarring at the junction of the right upper lobe.    Patient reports resolution of her infectious symptoms from December and is feeling at her baseline. She will occasionally have some right sided discomfort but it is overall improved from December. She notices the pain when laying down or moving when she sleeps. She denies any shortness of breath, fevers, chills or night sweats. She denies hemoptysis. She does have joint aches in general  which include her knees, hips and occasional hand stiffness in the mornings.   She has history of asthma and mainly has issues with reactive airways disease when triggered by viral infections and uses inhalers as needed. She denies issues with seasonal allergies. She had sinus surgery in the past with significant reduction in frequency of sinus infections.    She is a never smoker. She is a retired Engineer, civil (consulting). She has a sister with crohn's disease and her mother had colon cancer.        Office note by Jarold Motto PA Pt c/o headache x 1 week on right side of head behind ear. Took 2 extra strength Tylenol last night with relief. Has pain now  Headache She has been having an intermittent headache since 06/13/23.  She is taking extra strength Tylenol morning and evening with some relief. Took Sudafed two days ago without relief. Pain is located behind right ear. Rates pain 7/10 and describes as sharp throbbing pain. No change in lifestyle, eating, stress. Denies any recent head trauma. This kind of headache is unusual for her. She is not a "headache" person. Treated recently for maxillary sinusitis on 05/18/23.   Feels somewhat disoriented, especially on the right side of her vision field. 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 She has been taking alendronate for one year - unsure if this is contributing to her symptom(s).    No family history of stroke. Denies chest pain, shortness of breath. No change in bedding, pillows. Has not had her yearly eye exam for this year.   Denies rash. Had bilateral nasal sinus surgery in 2015.  Denies sinus pressure, flu symptoms.   New daily persistent headache Neuro exam is wnl No red flags on my exam Will obtain MR without contrast to further evaluate given persistence and age >50 If new/worsening symptom(s), likely will need to go to the ER Consider neurology referral    Past Medical History:  Diagnosis Date   Allergy  Child   Anemia    Arthritis    Asthma    Eczema    GERD (gastroesophageal reflux disease) 10/22   Hiatal hernia    Osteoporosis    Torn ligament    pt reports torn ligament in right ankle, use caution when turning   Ulcer 6/23   Vaginal delivery    x 2, 1985, 1990    Past Surgical History:  Procedure Laterality Date   COLONOSCOPY  01/23/2022   normal - 2012   LIPOMA EXCISION Right 03/27/2023   back   NASAL SEPTOPLASTY W/ TURBINOPLASTY Bilateral 10/24/2023   Procedure: Septoplasty/inferior turbinate reduction AND BALLOON SINUSPLASTY;  Surgeon: Ashok Croon, MD;  Location: [facility];  Service: ENT;  Laterality: Bilateral;   NASAL SINUS SURGERY Bilateral 2015   OTHER SURGICAL HISTORY     lipoma removed from back 03/2023   SINUS ENDO W/FUSION  Bilateral 10/24/2023   Procedure: Functional Endoscopic Sinus Surgery With Navigation;  Surgeon: Ashok Croon, MD;  Location: [facility];  Service: ENT;  Laterality: Bilateral;    Family History  Problem Relation Age of Onset   Colon cancer Mother    Cancer Mother    Hearing loss Mother    Heart failure Father    Asthma Father    COPD Father    Hearing loss Father    Crohn's disease Sister    Pancreatic cancer Other    Ovarian cancer Other    Uterine cancer Other    Allergic rhinitis Grandson    Food Allergy Grandson    Asthma Daughter     Social History:  reports that she has never smoked. She has never been exposed to tobacco smoke. She has never used smokeless tobacco. She reports that she does not currently use alcohol. She reports that she does not currently use drugs after having used the following drugs: Marijuana.  Allergies:  Allergies  Allergen Reactions   Other Hives    Adhesives   Tape Rash    Medications: I have reviewed the patient's current medications.  The PMH, PSH, Medications, Allergies, and SH were reviewed and updated.  ROS: Constitutional: Negative for fever,  weight loss and weight gain. Cardiovascular: Negative for chest pain and dyspnea on exertion. Respiratory: Is not experiencing shortness of breath at rest. Gastrointestinal: Negative for nausea and vomiting. Neurological: Negative for headaches. Psychiatric: The patient is not nervous/anxious  Blood pressure 134/75, pulse 84, SpO2 97%.  PHYSICAL EXAM:  Exam: PHYSICAL EXAM:  Exam: General: Well-developed, well-nourished Communication and Voice: raspy Respiratory Respiratory effort: Equal inspiration and expiration without stridor Cardiovascular Peripheral Vascular: Warm extremities with equal color/perfusion Eyes: No nystagmus with equal extraocular motion bilaterally Neuro/Psych/Balance: Patient oriented to person, place, and time; Appropriate mood and affect; Gait is intact with no imbalance; Cranial nerves I-XII are intact Head and Face Inspection: Normocephalic and atraumatic without mass or lesion Facial Strength: Facial motility symmetric and full bilaterally ENT Pinna: External ear intact and fully developed External canal: Canal is patent with intact skin External Nose: No scar or anatomic deformity Internal Nose: Crusting/nasal secretions along middle meatus, debrided b/l, edema along the maxillary antrostomy b/l, no significant purulence noted . Frontal recess stents in place. See procedure note for further details.   Lips, Teeth, and gums: Mucosa and teeth intact and viable TMJ: No pain to palpation with full mobility Oral cavity/oropharynx: No erythema or exudate, no lesions present Nasopharynx: No mass or lesion with intact mucosa   Procedure:  PROCEDURE NOTE: nasal endoscopy with post-op debridement   Preoperative diagnosis: chronic sinusitis with nasal polyps and hx of FESS  Postoperative diagnosis: same  Surgeon: Ashok Croon, M.D.  Anesthesia: Topical lidocaine and Afrin  H&P REVIEW: The patient's history and physical were reviewed today prior to  procedure. All medications were reviewed and updated as well. Complications: None Condition is stable throughout exam Indications and consent: The patient presents with symptoms of chronic sinusitis not responding to previous therapies. All the risks, benefits, and potential complications were reviewed with the patient preoperatively and informed consent was obtained. The time out was completed with confirmation of the correct procedure.   Procedure: The patient was seated upright in the clinic. Topical lidocaine and Afrin were applied to the nasal cavity. After adequate anesthesia had occurred, the rigid nasal endoscope was passed into the nasal cavity. The nasal mucosa, turbinates, septum, and sinus drainage pathways were  visualized bilaterally. Post-operative changes were noted with significant edema along middle meatus, which was removed using a combination of suction and Blakesley forceps. There was some erythema and edema noted b/l. The mucosa was intact. Frontal recess drug-eluting stents were in position. The scope was then slowly withdrawn and the patient tolerated the procedure well. There were no complications or blood loss.  Studies Reviewed:CT max/face CLINICAL DATA:  Sinus mass on recent MRI.   EXAM: CT sinuses 08/22/23   FINDINGS: Paranasal sinuses:   Frontal: Persistent, near complete opacification bilaterally with associated chronic osteitis.   Ethmoid: Moderate residual air cell opacification bilaterally, predominantly involving anterior air cells and overall greatly improved from prior (particularly posteriorly).   Maxillary: Prominent circumferential, mildly nodular/polypoid mucosal thickening bilaterally with associated partial mineralization of sinus contents and with chronic osteitis. Improved aeration from prior.   Sphenoid: Minimal mucosal thickening and possible trace fluid bilaterally, improved from prior.   Right ostiomeatal unit: Patent maxillary  antrostomy.   Left ostiomeatal unit: Patent maxillary antrostomy.   Nasal passages: Bilateral middle turbinate concha bullosa. Residual mucosal thickening primarily in the anterosuperior aspect of the nasal cavity, improved from prior. Essentially midline nasal septum.   Anatomy: No pneumatization superior to anterior ethmoid notches. Keros II with the left fovea ethmoidalis again noted to be higher than on the right. Sellar sphenoid pneumatization pattern. No dehiscence of carotid or optic canals. No onodi cell.   Other: Clear mastoid air cells and middle ear cavities. Unremarkable appearance of the orbits and included portion of the brain.   IMPRESSION: Overall improved pansinusitis although with persistent near complete opacification of the frontal sinuses.  CT MAXILLOFACIAL WITH CONTRAST 07/17/23   TECHNIQUE: Multidetector CT imaging of the maxillofacial structures was performed with intravenous contrast. Multiplanar CT image reconstructions were also generated.   RADIATION DOSE REDUCTION: This exam was performed according to the departmental dose-optimization program which includes automated exposure control, adjustment of the mA and/or kV according to patient size and/or use of iterative reconstruction technique.   CONTRAST:  75mL OMNIPAQUE IOHEXOL 300 MG/ML  SOLN   COMPARISON:  Brain MRI 07/10/2023   FINDINGS: Osseous: No fracture or destructive changes   Orbits: Unremarkable   Sinuses: Essentially confluent opacification of maxillary, ethmoid, and frontal sinuses bilaterally with diffuse obstruction of the upper nasal cavity by mucosal thickening. Partially mineralized debris is seen in the maxillary sinuses and extending towards the widened ostia. No clear destructive changes, asymmetry at the left cribriform plate on prior brain MRI is likely due to the higher left fovea ethmoidalis, a normal variant. No invasive features are seen. The nasal septum is nearly  midline.   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 No destructive or invasive features.  MRI Brain EXAM: MRI HEAD WITHOUT CONTRAST   TECHNIQUE: Multiplanar, multiecho pulse sequences of the brain and surrounding structures were obtained without intravenous contrast.   COMPARISON:  None Available.   FINDINGS: Brain: Negative for acute infarct. Small microhemorrhages in the inferomedial right frontal and left frontal lobe. No hydrocephalus. No extra-axial fluid collection. No mass effect. No mass lesion. Sequela of mild chronic microvascular ischemic change.   Vascular: Normal flow voids.   Skull and upper cervical spine: Normal marrow signal.   Sinuses/Orbits: No middle ear or mastoid effusion. There is near-complete opacification of the bilateral maxillary, ethmoid, and frontal sinuses. There is partial opacification of the bilateral sphenoid sinuses. The cribriform plate appears somewhat irregular  and there appears to be mass effect on the olfactory sulcus (series 117, image 63). The integrity of the lamina papyracea on the left is also uncertain (series 117, image 56).   Other: None.   IMPRESSION: 1. No acute intracranial abnormality. 2. Near-complete opacification of the bilateral maxillary, ethmoid, and frontal sinuses. The cribriform plate appears somewhat irregular and there appears to be mass effect on the olfactory sulcus. The integrity of the lamina papyracea on the left is also uncertain. Recommend further evaluation with a dedicated contrast-enhanced maxillofacial CT.  Audio 11/12/23 Tympanometry: Right ear: Type A- Normal external ear canal volume with normal middle ear pressure and tympanic membrane compliance. Left ear: Type A- Normal external ear canal volume with normal middle ear pressure and tympanic membrane compliance.     Pure tone  Audiometry: Right ear- Normal hearing from [phone] Hz, then mild to moderate presumably sensorineural 6000-8000 Hz . Left ear-  Normal hearing from [phone] Hz, then mild to moderate presumably sensorineural hearing loss from 4000 Hz - 8000 Hz.    Assessment/Plan: Encounter Diagnoses  Name Primary?   Deviated nasal septum    Sinusitis with nasal polyps Yes   Chronic pansinusitis    Nasal polyps    Chronic nasal congestion    Hypertrophy of both inferior nasal turbinates    Environmental and seasonal allergies    Sensorineural hearing loss, bilateral        Chronic sinusitis with nasal polyps + septal deviation and inferior turbinate hypertrophy - s/p FESS several years ago, with sx recurrence and evidence of severe inflammation bilateral maxillary and ethmoid sinuses -Initially had MRI brain which showed chronic sinus disease and potential irregularity at cribriform plat, but max face CT done following MRI brain did not reveal any bony defects in the skull base including area of concern along the cribriform plate -I discussed findings of both imaging studies with the patient -Based on my evaluation today including nasal endoscopy she has evidence of polyp regrowth and chronic nasal congestion along with septal deviation inferior turban hypertrophy and purulent secretions along the left middle meatus consistent with chronic rhinosinusitis with nasal polyps Plan  - she had recent CBC with diff, will order total IgE - CT sinuses brainlab protocol - CT max face with fewer images - need for image guidance during surgery  - nasal steroid rinses with [employer] [facility] nasal saline/mometasone to shrink the polyps and help with sx post-op -  Doxycycline 100 mg BID for sinus infection and Medrol Dose pack (4 mg tab pack, take as directed)  - Allergy referral for consideration to receive biologic such as Dupixent vs Xolair (hx of asthma) - I discussed management of CRSwNP and importance of surgical  options and medical management of allergies and chronic inflammation - we discussed risks and benefits of surgery and she would like to proceed - will schedule for revision sinus surgery (bilateral revision FESS, maxillary antrostomy b/l, vs balloon sinuplasty, bilateral total ethmoidectomy, bilateral nasal polypectomy, possible inferior turbinate reduction, possible septoplasty) -She reports history of normal IgA when she had her initial work[place], she inquired about the significance of the test result, I advised her to have immunoglobulin levels checked with an allergist, who can better discussed the significance of it if her levels are abnormal  2. Environmental allergies and hx of asthma -Continue Zyrtec 10 mg daily -Stop Flonase after initiation of nasal steroid rinses -Continue Singulair  3. Asthma  -Continue albuterol as  needed -Continue Singulair 10 mg daily -See pulmonary as needed established with Melody Comas, MD   Update 09/05/23 She saw Allergy, and had testing done. She had CT done. She was allergic to aspergillus and a couple of weeds on allergy testing with Dr Selena Batten. Being considered for Dupixent.   Chronic sinusitis with nasal polyps + septal deviation and inferior turbinate hypertrophy - s/p FESS several years ago, with sx recurrence and evidence of severe inflammation bilateral maxillary and ethmoid sinuses on CT max/face a few months ago.  -Initially had MRI brain which showed chronic sinus disease and potential irregularity at cribriform plat, but max face CT done following MRI brain did not reveal any bony defects in the skull base including area of concern along the cribriform plate -I discussed findings of both imaging studies with the patient -Based on nasal endoscopy during initial office visit she has evidence of polyp regrowth and chronic nasal congestion along with septal deviation inferior turbinate hypertrophy and  purulent secretions along the left middle meatus consistent with chronic rhinosinusitis with nasal polyps - took abx and steroids and repeat CT sinuses was done - which showed overall improved paranasal sinus disease with persistent opacification of frontal sinuses partial opacification of maxillary and ethmoid sinuses bilaterally -I reviewed imaging findings with the patient and we will plan for repeat nasal endoscopy in a few weeks prior to her scheduled sinus procedure - She had elevated total IgE   Plan  - continue nasal steroid rinses with [employer] [facility] nasal saline/mometasone to shrink the polyps and help with sx post-op - proceed with Dupixent if able to get insurance approval - I discussed management of CRSwNP and importance of surgical options and medical management of allergies and chronic inflammation - we discussed risks and benefits of surgery and she would like to proceed - will plan for revision sinus surgery (based on imaging results we will plan for bilateral maxillary sinus lavage, removal of the residual ethmoid partitions and frontal balloon sinuplasty and inferior turbinate reduction.  -She reports history of normal IgA when she had [place], she inquired about the significance of the test result, I advised her to have immunoglobulin levels checked with an allergist, who can better discussed the significance of it if her levels are abnormal  2. Environmental allergies and hx of asthma -Continue Zyrtec 10 mg daily -continue nasal steroid rinses with Budesonide -Continue Singulair - initiate Dupixent if a candidate and approved by insurance  3. Asthma  -Continue albuterol as needed -Continue Singulair 10 mg daily -See pulmonary as needed established with Melody Comas, MD  RTC in a few weeks, will plan repeat nasal endoscopy and will discuss details of planned surgery.  Update 10/15/23 Assessment and Plan     Chronic Sinusitis with Nasal Polyps Chronic sinusitis with nasal polyps, previously treated with surgery. Current symptoms include mucus retention, nasal congestion, and polyps observed on examination/CT scan. 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 Dupixent initiated with three injections so far. Plan for less extensive surgery to remove partitions, wash out maxillary sinuses, and address frontal sinuses. Discussed benefits (improved drainage, reduced symptoms), risks, and recovery expectations (shorter than previous surgery). Shared decision-making regarding procedure and continuation of Dupixent. Discussed postoperative care including sinus precautions and minimal packing. - Proceed with revision FESS  - Continue Dupixent injections - Provide postoperative care instructions including sinus precautions and minimal packing - Schedule  follow-up appointment on November 05, 2023  Dysphonia and sx of Laryngitis after initial Dupixent shot, voice loss for a few days, likely due to concurrent viral infection vs reflux laryngitis with symptoms of voice changes and throat irritation. No evidence of bacterial laryngitis. Currently takes Pepcid. Recommended a natural supplement to complement Pepcid and reduce reflux symptoms. - Continue Pepcid 20 mg daily  - Recommend Reflux Gourmet supplement to be taken after meals and before bedtime - diet and lifestyle changes to minimize reflux   Follow-up - Follow-up appointment on November 05, 2023 postop - Follow-up with allergist on October 31, 2023.    I spent 30 minutes in total face-to-face time and in reviewing records during pre-charting, more than 50% of which was spent in counseling and coordination of care, reviewing test results, reviewing medications and treatment regimen and/or in discussing or reviewing the diagnosis, the prognosis and treatment options. Pertinent laboratory and imaging  test results that were available during this visit with the patient were reviewed by me and considered in my medical decision making (see chart for details).   Update 11/05/23 2 weeks post-op revision FESS and removal of nasal polyps, doing well   Assessment and Plan     Chronic Sinusitis with Nasal Polyps Postoperative care following sinus surgery with stent placement in the frontal outflow tract. Significant swelling, mucin, and polyps present during the case. Improved breathing with occasional dark, dry blood discharge post-op per report. Crusting observed on nasal endoscopy today and debridement was performed. Culture showed Serratia species (few cells) responsive to all tested abx. Discussed continued nasal irrigation to prevent crusting and promote healing. Potential addition of antibiotic and steroid to the rinse if crusting persists. We discussed that stents elute steroids for about thirty days and dissolve with rinses.  - Continue nasal rinses three/four times daily - Consider adding a few drops of baby shampoo to the rinse to loosen the crusting - Prescribe budesonide and antibiotic rinse if significant crusting persists at next visit - Schedule follow-up appointment after Christmas, either on Thursday, November 15, 2023, or the following week  Tinnitus Intermittent high-pitched ringing in the ears, possibly due to age-related hearing loss, eustachian tube dysfunction, or nasal congestion post-surgery. No ear wax or fluid observed on exam. - Order hearing test.     Update 11/15/23 Assessment and Plan    Chronic Sinusitis with Nasal Polyps S/p b/l revision FESS 3 weeks ago, reports nasal stuffiness/congestion. Examination reveals significant swelling and secretions, particularly on the left side, were removed with suction and minimal crusting which was removed with [place].  Adding baby shampoo irrigation appeared to have helped with crusting. Currently on Dupixent for long-term  management of inflammation and polyp regrowth. Discussed potential need for oral steroids and antibiotics if symptoms worsen in the next few days. Goal is to limit systemic steroids, but current necessity acknowledged due to significant swelling post-op. Alternative of steroid rinses also discussed - Prescribe oral steroids if symptoms worsen in the next week to be taken if needed - Refill budesonide nasal rinses - ok to start doing a rinse with steroids BID and rinse without steroids 1-2 per day - Schedule follow-up in two weeks  Suspected Laryngospasm Episodes of choking and dyspnea attributed to laryngospasm or vocal cord dysfunction. Explained as an exaggerated reflex to prevent aspiration. Provided instructions on managing episodes using specific breathing techniques. - Instruct to breathe through the nose and out through the mouth during episodes - Advise 'smell the roses and blow out  candles' technique to manage laryngospasm  Possible Dupixent Side Effects Reports joint pain and swelling in the finger (R ring finger) after minor trauma 2 weeks ago. Advised to contact allergist to discuss symptoms and determine if related to medication. 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            Ashok Croon, MD Otolaryngology [facility] Phone: [phone] Fax: [phone]    11/15/2023, 12:47 PM

## 2023-11-23 LAB — FUNGUS CULTURE WITH STAIN

## 2023-11-23 LAB — FUNGAL ORGANISM REFLEX

## 2023-11-23 LAB — FUNGUS CULTURE RESULT

## 2023-11-28 ENCOUNTER — Other Ambulatory Visit: Payer: Self-pay

## 2023-11-28 NOTE — Progress Notes (Signed)
 Specialty Pharmacy Refill Coordination Note  Lindsey Knight is a 63 y.o. female contacted today regarding refills of specialty medication(s) Dupilumab  (Dupixent )   Patient requested Pickup at [facility] at [place] date: 11/30/23   Medication will be filled on 11/29/23.

## 2023-11-29 ENCOUNTER — Other Ambulatory Visit: Payer: Self-pay

## 2023-11-29 ENCOUNTER — Ambulatory Visit (INDEPENDENT_AMBULATORY_CARE_PROVIDER_SITE_OTHER): Payer: BC Managed Care – PPO | Admitting: Otolaryngology

## 2023-12-02 ENCOUNTER — Other Ambulatory Visit: Payer: Self-pay | Admitting: Physician Assistant

## 2023-12-03 ENCOUNTER — Other Ambulatory Visit (HOSPITAL_COMMUNITY): Payer: Self-pay

## 2023-12-05 ENCOUNTER — Other Ambulatory Visit (HOSPITAL_COMMUNITY)
Admission: RE | Admit: 2023-12-05 | Discharge: 2023-12-05 | Disposition: A | Discharge status: Home / Self Care | Payer: BC Managed Care – PPO | Attending: Otolaryngology | Admitting: Otolaryngology

## 2023-12-05 ENCOUNTER — Ambulatory Visit (INDEPENDENT_AMBULATORY_CARE_PROVIDER_SITE_OTHER): Payer: BC Managed Care – PPO | Admitting: Otolaryngology

## 2023-12-05 ENCOUNTER — Encounter (INDEPENDENT_AMBULATORY_CARE_PROVIDER_SITE_OTHER): Payer: Self-pay | Admitting: Otolaryngology

## 2023-12-05 VITALS — BP 127/82 | HR 63

## 2023-12-05 DIAGNOSIS — J329 Chronic sinusitis, unspecified: Secondary | ICD-10-CM

## 2023-12-05 DIAGNOSIS — J339 Nasal polyp, unspecified: Secondary | ICD-10-CM | POA: Diagnosis not present

## 2023-12-05 DIAGNOSIS — R0981 Nasal congestion: Secondary | ICD-10-CM

## 2023-12-05 DIAGNOSIS — H903 Sensorineural hearing loss, bilateral: Secondary | ICD-10-CM

## 2023-12-05 DIAGNOSIS — J324 Chronic pansinusitis: Secondary | ICD-10-CM

## 2023-12-05 DIAGNOSIS — J3089 Other allergic rhinitis: Secondary | ICD-10-CM

## 2023-12-05 MED ORDER — LEVOFLOXACIN 500 MG PO TABS
500.0000 mg | ORAL_TABLET | Freq: Every day | ORAL | 0 refills | Status: AC
Start: 2023-12-05 — End: 2023-12-19

## 2023-12-05 NOTE — Progress Notes (Addendum)
ENT Progress Note  Update 12/05/23 Discussed the use of AI scribe software for clinical note transcription with the patient, who gave verbal consent to proceed.  History of Present Illness   S/p revision FESS 6 weeks ago. She report a sensation of stuffiness and discharge, despite regular nasal irrigation three times a day with budesonide and saline. The patient also mentions experiencing headaches for the past few days.  Despite the absence of a runny nose, the patient notes a significant amount of discharge during nasal rinses.   The patient has been on Dupixent and budesonide nasal rinses, with no significant improvement in symptoms. They have also taken oral steroids, which they report did not provide noticeable relief.  The patient expresses concern about an upcoming flight due to their current sinus discomfort and past experiences of severe pain during flights. They have tried Sudafed and Afrin in the past with limited success.  Update 11/15/23 Discussed the use of AI scribe software for clinical note transcription with the patient, who gave verbal consent to proceed.  History of Present Illness   The patient, with a history of chronic sinusitis with nasal polyps, presents for post-op f/u (had revision FESS 3 weeks ago). They report using baby shampoo for nasal irrigation, which has resulted in increased discharge. However, they also note a sensation of stuffiness, particularly in the upper nasal region, which is worse upon waking and has led to difficulty breathing through the nose. The patient has been performing nasal rinses three times daily, but feels that the irrigation is not reaching the upper nasal passages due to blockage.   In addition to the nasal symptoms, the patient has experienced an episode of choking, which they describe as a sudden inability to breathe or speak, occurring while not eating or drinking. This resolved spontaneously but was distressing for the patient.  The  patient also mentions a recent injury to a finger (right ring finger), which has become increasingly swollen and bruised over the past two weeks. They are unsure if this is related to their current medication, Dupixent, which they self-administer for an unspecified condition. The patient has been on Dupixent for less than a month.    Update 11/05/23:  1 week post-op following b/l revision FESS (b/l maxillary, b/l ethmoid and frontal sinus (balloon sinuplasty for frontal sinus + stent b/l)) and b/l nasal polyp removal. Doing well. No significant pain or epistaxis noted post-op, doing nasal rinses 3-4 times a day. No vision changes. No facial swelling.   Update 10/15/23:  Discussed the use of AI scribe software for clinical note transcription with the patient, who gave verbal consent to proceed.  History of Present Illness   The patient, with a history of chronic sinusitis and nasal polyps, presents for a preoperative visit. They have been experiencing persistent nasal congestion and postnasal drip, which they describe as a "gob of stuff" that they cough up in the mornings. The patient has been using budesonide nasal rinses, which have been somewhat effective in clearing the nasal passages. However, they have also noticed some nasal bleeding, which they attribute to the steroid rinses.  The patient has recently started on Dupixent, with three injections administered so far. Following the first injection, they experienced a week-long loss of voice, which they initially attributed to the medication. However, they now believe it may have been due to a concurrent viral infection, as similar symptoms were experienced by family members.  The patient also reports a history of reflux, for which they  take Pepcid. They have noticed some improvement in their reflux symptoms with this medication. They also have a known hiatal hernia, which they believe may be contributing to their reflux symptoms.  The patient has  previously undergone sinus surgery, which provided some relief from their chronic sinusitis symptoms. However, they continue to experience nasal congestion and postnasal drip, and have been noted to have persistent nasal polyps on examination.      Records reviewed:  Update 09/05/23 She returns for follow-up after imaging and labs. Started budesonide rinses and on daily antihistamine. Saw allergy and was tested for allergies. Dupixent was recommended. Insurance pre-approval for Dupixent is pending. She completed sinus CT. She feels somewhat better currently. No acute sinus infections since last office visit.   Initial Evaluation:  Reason for Consult: chronic headaches and sinusitis on CT max/face    HPI: Lindsey Knight is an 63 y.o. female with hx of chronic nasal congestion, frequent sinus infections for several decades and chronic headaches, here for initial evaluation of her sx. She has a longstanding history of sinus infections and allergy symptoms dating back several decades ago.  She received allergy shots for years in [place], moved to [facility] and allergy testing was negative, then moved to [place], again allergy skin testing was negative per report . she then started to have recurrent sinus infections, required multiple courses of antibiotics and steroids. She then had sinus surgery 13 yrs ago, and after sinus surgery her sinus infections became less frequent.  She reports that her sinus surgeon did IgE levels at the time and IgA levels were low.  She is on Zyrtec daily and uses Atrovent spray and Flonase nasal spray BID. At night she takes Singulair. She uses albuterol when her symptoms worsen. She took abx and steroids after her most recent bout of sinus infection and currently feeling better.  Denies history of allergies to aspirin or NSAIDs.  She had CT max face/MRI brain ordered due to persistent headache. MRI brain was concerning for potential defect at cribriform plate and bilateral sinusitis.  CT  max face was ordered in follow-up and it did not show a defect at the skull base, but demonstrated bilateral sinusitis involving maxillary and ethmoid sinuses and evidence of prior sinus surgery.  Office visit with Dr Derek Jack 05/18/23 Lindsey Knight is a 63 year old woman, never smoker with history of asthma who returns to pulmonary clinic for cough.    She reports sinus drainage with post nasal drip and some sinus pressure over recent weeks. She has significant cough that can keep her up at night. She is using advair 250-21mcg 1 puff twice daily and as needed albuterol. She is using flonase 1 spray per nostril daily. She continues to have reflux symptoms despite famotidine 20mg  twice daily.    OV 10/09/22 She reports increased cough over the past 2 months. She describes an event where she was bending over in the community garden and possibly had a significant reflux episode. She denies any viral illnesses recently.    She saw GI 08/01/22 with plan to reduce PPI dosing then transition to famotidine for 2 weeks then to use as needed. She reports the famotidine did not help, she reports burning sensation in her chest and throat. She then started omeprazole OTC which seems to have reduced her reflux symptoms.    She has advair 250-67mcg 1 puff twice daily that she has been using as needed. She has albuterol as needed as well. She has not felt  much relief.    She has not been on steroids or antibiotics.    OV 03/06/22 Chest x-ray today shows clearing of the right upper lobe infiltrates. Will await final radiology read.    She is using advair 100-50 mcg 1 puff twice daily as needed along with daily allegra and montelukast for her asthma. No night time awakenings. No issues with spring allergies.   She has been feeling well since last visit. He cough has significantly decreased. She recently had EGD and C-scope and started on PPI therapy for GERD.    OV 01/03/22 Patient reports having respiratory  illness in mid-December 2022 and was treated with Zpak on 12/13. PCP note from 11/08/2021 reviewed. Chest x-ray on 12/30 showed increased lung markings with mild right mid lung and left basilar linear scaring or atelectasis. She was also noted to have increased platelet count. She had CT Chest on 11/29/21 that showed mediastinal, axillary and hilar lymphadenopathy along with patchy ground-glass and adjacent focal clustered nodularity in the right upper lobe and adjacent band like atelectasis/scarring at the junction of the right upper lobe.    Patient reports resolution of her infectious symptoms from December and is feeling at her baseline. She will occasionally have some right sided discomfort but it is overall improved from December. She notices the pain when laying down or moving when she sleeps. She denies any shortness of breath, fevers, chills or night sweats. She denies hemoptysis. She does have joint aches in general which include her knees, hips and occasional hand stiffness in the mornings.   She has history of asthma and mainly has issues with reactive airways disease when triggered by viral infections and uses inhalers as needed. She denies issues with seasonal allergies. She had sinus surgery in the past with significant reduction in frequency of sinus infections.    She is a never smoker. She is a retired Engineer, civil (consulting). She has a sister with crohn's disease and her mother had colon cancer.        Office note by Jarold Motto PA Pt c/o headache x 1 week on right side of head behind ear. Took 2 extra strength Tylenol last night with relief. Has pain now  Headache She has been having an intermittent headache since 06/13/23.  She is taking extra strength Tylenol morning and evening with some relief. Took Sudafed two days ago without relief. Pain is located behind right ear. Rates pain 7/10 and describes as sharp throbbing pain. No change in lifestyle, eating, stress. Denies any recent head  trauma. This kind of headache is unusual for her. She is not a "headache" person. Treated recently for maxillary sinusitis on 05/18/23.   Feels somewhat disoriented, especially on the right side of her vision field. 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 She has been taking alendronate for one year - unsure if this is contributing to her symptom(s).    No family history of stroke. Denies chest pain, shortness of breath. No change in bedding, pillows. Has not had her yearly eye exam for this year.   Denies rash. Had bilateral nasal sinus surgery in 2015.  Denies sinus pressure, flu symptoms.   New daily persistent headache Neuro exam is wnl No red flags on my exam Will obtain MR without contrast to further evaluate given persistence and age >89 If new/worsening symptom(s), likely will need to go to the ER Consider neurology referral    Past Medical History:  Diagnosis Date   Allergy Child  Anemia    Arthritis    Asthma    Eczema    GERD (gastroesophageal reflux disease) 10/22   Hiatal hernia    Osteoporosis    Torn ligament    pt reports torn ligament in right ankle, use caution when turning   Ulcer 6/23   Vaginal delivery    x 2, 1985, 1990    Past Surgical History:  Procedure Laterality Date   COLONOSCOPY  01/23/2022   normal - 2012   LIPOMA EXCISION Right 03/27/2023   back   NASAL SEPTOPLASTY W/ TURBINOPLASTY Bilateral 10/24/2023   Procedure: Septoplasty/inferior turbinate reduction AND BALLOON SINUSPLASTY;  Surgeon: Ashok Croon, MD;  Location: [facility];  Service: ENT;  Laterality: Bilateral;   NASAL SINUS SURGERY Bilateral 2015   OTHER SURGICAL HISTORY     lipoma removed from back 03/2023   SINUS ENDO W/FUSION Bilateral 10/24/2023   Procedure: Functional Endoscopic Sinus Surgery With Navigation;  Surgeon: Ashok Croon, MD;  Location: [facility];  Service: ENT;  Laterality: Bilateral;    Family  History  Problem Relation Age of Onset   Colon cancer Mother    Cancer Mother    Hearing loss Mother    Heart failure Father    Asthma Father    COPD Father    Hearing loss Father    Crohn's disease Sister    Pancreatic cancer Other    Ovarian cancer Other    Uterine cancer Other    Allergic rhinitis Grandson    Food Allergy Grandson    Asthma Daughter     Social History:  reports that she has never smoked. She has never been exposed to tobacco smoke. She has never used smokeless tobacco. She reports that she does not currently use alcohol. She reports that she does not currently use drugs after having used the following drugs: Marijuana.  Allergies:  Allergies  Allergen Reactions   Other Hives    Adhesives   Tape Rash    Medications: I have reviewed the patient's current medications.  The PMH, PSH, Medications, Allergies, and SH were reviewed and updated.  ROS: Constitutional: Negative for fever, weight loss and weight gain. Cardiovascular: Negative for chest pain and dyspnea on exertion. Respiratory: Is not experiencing shortness of breath at rest. Gastrointestinal: Negative for nausea and vomiting. Neurological: Negative for headaches. Psychiatric: The patient is not nervous/anxious  Blood pressure 127/82, pulse 63, SpO2 99%.  PHYSICAL EXAM:  Exam: PHYSICAL EXAM:  Exam: General: Well-developed, well-nourished Communication and Voice: raspy Respiratory Respiratory effort: Equal inspiration and expiration without stridor Cardiovascular Peripheral Vascular: Warm extremities with equal color/perfusion Eyes: No nystagmus with equal extraocular motion bilaterally Neuro/Psych/Balance: Patient oriented to person, place, and time; Appropriate mood and affect; Gait is intact with no imbalance; Cranial nerves I-XII are intact Head and Face Inspection: Normocephalic and atraumatic without mass or lesion Facial Strength: Facial motility symmetric and full  bilaterally ENT Pinna: External ear intact and fully developed External canal: Canal is patent with intact skin External Nose: No scar or anatomic deformity Internal Nose: Minimal crusting/nasal secretions along middle meatus, debrided b/l, no significant purulence noted but left side swabbed for culture. See procedure note for further details.   Lips, Teeth, and gums: Mucosa and teeth intact and viable TMJ: No pain to palpation with full mobility Oral cavity/oropharynx: No erythema or exudate, no lesions present Nasopharynx: No mass or lesion with intact mucosa   Procedure:  PROCEDURE NOTE: nasal endoscopy with post-op debridement  Preoperative diagnosis: chronic sinusitis with nasal polyps and hx of FESS  Postoperative diagnosis: same  Surgeon: Ashok Croon, M.D.  Anesthesia: Topical lidocaine and Afrin  H&P REVIEW: The patient's history and physical were reviewed today prior to procedure. All medications were reviewed and updated as well. Complications: None Condition is stable throughout exam Indications and consent: The patient presents with symptoms of chronic sinusitis not responding to previous therapies. All the risks, benefits, and potential complications were reviewed with the patient preoperatively and informed consent was obtained. The time out was completed with confirmation of the correct procedure.   Procedure: The patient was seated upright in the clinic. Topical lidocaine and Afrin were applied to the nasal cavity. After adequate anesthesia had occurred, the rigid nasal endoscope was passed into the nasal cavity. The nasal mucosa, turbinates, septum, and sinus drainage pathways were visualized bilaterally. Post-operative changes were noted with mild edema along middle meatus. Using a combination of suction and Blakesley forceps, thick drainage and crusting was removed along the left skull base, and drainage was cultured with a culture swab. There was some erythema  and edema noted b/l. The mucosa was intact. The scope was then slowly withdrawn and the patient tolerated the procedure well. There were no complications or blood loss.  Studies Reviewed:CT max/face CLINICAL DATA:  Sinus mass on recent MRI.   EXAM: CT sinuses 08/22/23   FINDINGS: Paranasal sinuses:   Frontal: Persistent, near complete opacification bilaterally with associated chronic osteitis.   Ethmoid: Moderate residual air cell opacification bilaterally, predominantly involving anterior air cells and overall greatly improved from prior (particularly posteriorly).   Maxillary: Prominent circumferential, mildly nodular/polypoid mucosal thickening bilaterally with associated partial mineralization of sinus contents and with chronic osteitis. Improved aeration from prior.   Sphenoid: Minimal mucosal thickening and possible trace fluid bilaterally, improved from prior.   Right ostiomeatal unit: Patent maxillary antrostomy.   Left ostiomeatal unit: Patent maxillary antrostomy.   Nasal passages: Bilateral middle turbinate concha bullosa. Residual mucosal thickening primarily in the anterosuperior aspect of the nasal cavity, improved from prior. Essentially midline nasal septum.   Anatomy: No pneumatization superior to anterior ethmoid notches. Keros II with the left fovea ethmoidalis again noted to be higher than on the right. Sellar sphenoid pneumatization pattern. No dehiscence of carotid or optic canals. No onodi cell.   Other: Clear mastoid air cells and middle ear cavities. Unremarkable appearance of the orbits and included portion of the brain.   IMPRESSION: Overall improved pansinusitis although with persistent near complete opacification of the frontal sinuses.  CT MAXILLOFACIAL WITH CONTRAST 07/17/23   TECHNIQUE: Multidetector CT imaging of the maxillofacial structures was performed with intravenous contrast. Multiplanar CT image reconstructions were also  generated.   RADIATION DOSE REDUCTION: This exam was performed according to the departmental dose-optimization program which includes automated exposure control, adjustment of the mA and/or kV according to patient size and/or use of iterative reconstruction technique.   CONTRAST:  75mL OMNIPAQUE IOHEXOL 300 MG/ML  SOLN   COMPARISON:  Brain MRI 07/10/2023   FINDINGS: Osseous: No fracture or destructive changes   Orbits: Unremarkable   Sinuses: Essentially confluent opacification of maxillary, ethmoid, and frontal sinuses bilaterally with diffuse obstruction of the upper nasal cavity by mucosal thickening. Partially mineralized debris is seen in the maxillary sinuses and extending towards the widened ostia. No clear destructive changes, asymmetry at the left cribriform plate on prior brain MRI is likely due to the higher left fovea ethmoidalis, a normal variant. No invasive features  are seen. The nasal septum is nearly midline.   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 No destructive or invasive features.  MRI Brain EXAM: MRI HEAD WITHOUT CONTRAST   TECHNIQUE: Multiplanar, multiecho pulse sequences of the brain and surrounding structures were obtained without intravenous contrast.   COMPARISON:  None Available.   FINDINGS: Brain: Negative for acute infarct. Small microhemorrhages in the inferomedial right frontal and left frontal lobe. No hydrocephalus. No extra-axial fluid collection. No mass effect. No mass lesion. Sequela of mild chronic microvascular ischemic change.   Vascular: Normal flow voids.   Skull and upper cervical spine: Normal marrow signal.   Sinuses/Orbits: No middle ear or mastoid effusion. There is near-complete opacification of the bilateral maxillary, ethmoid, and frontal sinuses. There is partial opacification of the bilateral sphenoid  sinuses. The cribriform plate appears somewhat irregular and there appears to be mass effect on the olfactory sulcus (series 117, image 63). The integrity of the lamina papyracea on the left is also uncertain (series 117, image 56).   Other: None.   IMPRESSION: 1. No acute intracranial abnormality. 2. Near-complete opacification of the bilateral maxillary, ethmoid, and frontal sinuses. The cribriform plate appears somewhat irregular and there appears to be mass effect on the olfactory sulcus. The integrity of the lamina papyracea on the left is also uncertain. Recommend further evaluation with a dedicated contrast-enhanced maxillofacial CT.  Audio 11/12/23 Tympanometry: Right ear: Type A- Normal external ear canal volume with normal middle ear pressure and tympanic membrane compliance. Left ear: Type A- Normal external ear canal volume with normal middle ear pressure and tympanic membrane compliance.     Pure tone Audiometry: Right ear- Normal hearing from [phone], then mild to moderate presumably sensorineural 6000-8000 Hz . Left ear-  Normal hearing from [phone], then mild to moderate presumably sensorineural hearing loss from 4000 Hz - 8000 Hz.    Assessment/Plan: Encounter Diagnoses  Name Primary?   Chronic sinusitis, unspecified location Yes   Sensorineural hearing loss, bilateral    Nasal polyps    Sinusitis with nasal polyps    Chronic pansinusitis    Chronic nasal congestion    Environmental and seasonal allergies         Chronic sinusitis with nasal polyps + septal deviation and inferior turbinate hypertrophy - s/p FESS several years ago, with sx recurrence and evidence of severe inflammation bilateral maxillary and ethmoid sinuses -Initially had MRI brain which showed chronic sinus disease and potential irregularity at cribriform plat, but max face CT done following MRI brain did not reveal any bony defects in the skull base including area of concern  along the cribriform plate -I discussed findings of both imaging studies with the patient -Based on my evaluation today including nasal endoscopy she has evidence of polyp regrowth and chronic nasal congestion along with septal deviation inferior turban hypertrophy and purulent secretions along the left middle meatus consistent with chronic rhinosinusitis with nasal polyps Plan  - she had recent CBC with diff, will order total IgE - CT sinuses brainlab protocol - CT max face with fewer images - need for image guidance during surgery  - nasal steroid rinses with [facility] nasal saline/mometasone to shrink the polyps and help with sx post-op -  Doxycycline 100 mg BID for sinus infection and Medrol Dose pack (4 mg tab pack, take as directed)  - Allergy referral for consideration to receive biologic such as Dupixent vs  Xolair (hx of asthma) - I discussed management of CRSwNP and importance of surgical options and medical management of allergies and chronic inflammation - we discussed risks and benefits of surgery and she would like to proceed - will schedule for revision sinus surgery (bilateral revision FESS, maxillary antrostomy b/l, vs balloon sinuplasty, bilateral total ethmoidectomy, bilateral nasal polypectomy, possible inferior turbinate reduction, possible septoplasty) -She reports history of normal IgA when she had her initial work[place], she inquired about the significance of the test result, I advised her to have immunoglobulin levels checked with an allergist, who can better discussed the significance of it if her levels are abnormal  2. Environmental allergies and hx of asthma -Continue Zyrtec 10 mg daily -Stop Flonase after initiation of nasal steroid rinses -Continue Singulair  3. Asthma  -Continue albuterol as needed -Continue Singulair 10 mg daily -See pulmonary as needed established with Melody Comas, MD   Update 09/05/23 She saw  Allergy, and had testing done. She had CT done. She was allergic to aspergillus and a couple of weeds on allergy testing with Dr Selena Batten. Being considered for Dupixent.   Chronic sinusitis with nasal polyps + septal deviation and inferior turbinate hypertrophy - s/p FESS several years ago, with sx recurrence and evidence of severe inflammation bilateral maxillary and ethmoid sinuses on CT max/face a few months ago.  -Initially had MRI brain which showed chronic sinus disease and potential irregularity at cribriform plat, but max face CT done following MRI brain did not reveal any bony defects in the skull base including area of concern along the cribriform plate -I discussed findings of both imaging studies with the patient -Based on nasal endoscopy during initial office visit she has evidence of polyp regrowth and chronic nasal congestion along with septal deviation inferior turbinate hypertrophy and purulent secretions along the left middle meatus consistent with chronic rhinosinusitis with nasal polyps - took abx and steroids and repeat CT sinuses was done - which showed overall improved paranasal sinus disease with persistent opacification of frontal sinuses partial opacification of maxillary and ethmoid sinuses bilaterally -I reviewed imaging findings with the patient and we will plan for repeat nasal endoscopy in a few weeks prior to her scheduled sinus procedure - She had elevated total IgE   Plan  - continue nasal steroid rinses with [employer] nasal saline/mometasone to shrink the polyps and help with sx post-op - proceed with Dupixent if able to get insurance approval - I discussed management of CRSwNP and importance of surgical options and medical management of allergies and chronic inflammation - we discussed risks and benefits of surgery and she would like to proceed - will plan for revision sinus surgery (based on imaging results we will plan for bilateral maxillary sinus lavage, removal of  the residual ethmoid partitions and frontal balloon sinuplasty and inferior turbinate reduction.  -She reports history of normal IgA when she had her initial work[place], she inquired about the significance of the test result, I advised her to have immunoglobulin levels checked with an allergist, who can better discussed the significance of it if her levels are abnormal  2. Environmental allergies and hx of asthma -Continue Zyrtec 10 mg daily -continue nasal steroid rinses with Budesonide -Continue Singulair - initiate Dupixent if a candidate and approved by insurance  3. Asthma  -Continue albuterol as needed -Continue Singulair 10 mg daily -See pulmonary as needed established with Christiane Ha  Dewald, MD  RTC in a few weeks, will plan repeat nasal endoscopy and will discuss details of planned surgery.  Update 10/15/23 Assessment and Plan    Chronic Sinusitis with Nasal Polyps Chronic sinusitis with nasal polyps, previously treated with surgery. Current symptoms include mucus retention, nasal congestion, and polyps observed on examination/CT scan. 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 Dupixent initiated with three injections so far. Plan for less extensive surgery to remove partitions, wash out maxillary sinuses, and address frontal sinuses. Discussed benefits (improved drainage, reduced symptoms), risks, and recovery expectations (shorter than previous surgery). Shared decision-making regarding procedure and continuation of Dupixent. Discussed postoperative care including sinus precautions and minimal packing. - Proceed with revision FESS  - Continue Dupixent injections - Provide postoperative care instructions including sinus precautions and minimal packing - Schedule follow-up appointment on November 05, 2023  Dysphonia and sx of Laryngitis after initial Dupixent shot, voice loss  for a few days, likely due to concurrent viral infection vs reflux laryngitis with symptoms of voice changes and throat irritation. No evidence of bacterial laryngitis. Currently takes Pepcid. Recommended a natural supplement to complement Pepcid and reduce reflux symptoms. - Continue Pepcid 20 mg daily  - Recommend Reflux Gourmet supplement to be taken after meals and before bedtime - diet and lifestyle changes to minimize reflux   Follow-up - Follow-up appointment on November 05, 2023 postop - Follow-up with allergist on October 31, 2023.    I spent 30 minutes in total face-to-face time and in reviewing records during pre-charting, more than 50% of which was spent in counseling and coordination of care, reviewing test results, reviewing medications and treatment regimen and/or in discussing or reviewing the diagnosis, the prognosis and treatment options. Pertinent laboratory and imaging test results that were available during this visit with the patient were reviewed by me and considered in my medical decision making (see chart for details).   Update 11/05/23 2 weeks post-op revision FESS and removal of nasal polyps, doing well   Assessment and Plan     Chronic Sinusitis with Nasal Polyps Postoperative care following sinus surgery with stent placement in the frontal outflow tract. Significant swelling, mucin, and polyps present during the case. Improved breathing with occasional dark, dry blood discharge post-op per report. Crusting observed on nasal endoscopy today and debridement was performed. Culture showed Serratia species (few cells) responsive to all tested abx. Discussed continued nasal irrigation to prevent crusting and promote healing. Potential addition of antibiotic and steroid to the rinse if crusting persists. We discussed that stents elute steroids for about thirty days and dissolve with rinses.  - Continue nasal rinses three/four times daily - Consider adding a few drops of baby  shampoo to the rinse to loosen the crusting - Prescribe budesonide and antibiotic rinse if significant crusting persists at next visit - Schedule follow-up appointment after Christmas, either on Thursday, November 15, 2023, or the following week  Tinnitus Intermittent high-pitched ringing in the ears, possibly due to age-related hearing loss, eustachian tube dysfunction, or nasal congestion post-surgery. No ear wax or fluid observed on exam. - Order hearing test.     Update 11/15/23 Assessment and Plan    Chronic Sinusitis with Nasal Polyps S/p b/l revision FESS 3 weeks ago, reports nasal stuffiness/congestion. Examination reveals significant swelling and secretions, particularly on the left side, were removed with suction and minimal crusting which was removed with [place].  Adding baby shampoo irrigation appeared to have helped  with crusting. Currently on Dupixent for long-term management of inflammation and polyp regrowth. Discussed potential need for oral steroids and antibiotics if symptoms worsen in the next few days. Goal is to limit systemic steroids, but current necessity acknowledged due to significant swelling post-op. Alternative of steroid rinses also discussed - Prescribe oral steroids if symptoms worsen in the next week to be taken if needed - Refill budesonide nasal rinses - ok to start doing a rinse with steroids BID and rinse without steroids 1-2 per day - Schedule follow-up in two weeks  Suspected Laryngospasm Episodes of choking and dyspnea attributed to laryngospasm or vocal cord dysfunction. Explained as an exaggerated reflex to prevent aspiration. Provided instructions on managing episodes using specific breathing techniques. - Instruct to breathe through the nose and out through the mouth during episodes - Advise 'smell the roses and blow out candles' technique to manage laryngospasm  Possible Dupixent Side Effects Reports joint pain and swelling in the finger (R  ring finger) after minor trauma 2 weeks ago. Advised to contact allergist to discuss symptoms and determine if related to medication. 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     12/05/23 6 weeks post-op FESS  Nasal endoscopy with debridement done today, overall less inflammation on exam today, with reduced edema along maxillary antrostomy especially on the left. Some crusting present, and small amount of purulent secretions on the left, swabbed for culture. OR cx sensitivity results show both S.aureus and Serratia, sensitive to Gentamicin    Chronic Sinusitis with Nasal Polyps S/p b/l revision FESS 6 weeks ago, reports nasal stuffiness/congestion.  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

## 2023-12-10 LAB — AEROBIC/ANAEROBIC CULTURE W GRAM STAIN (SURGICAL/DEEP WOUND)

## 2023-12-14 ENCOUNTER — Other Ambulatory Visit (HOSPITAL_COMMUNITY): Payer: Self-pay

## 2023-12-18 ENCOUNTER — Telehealth (INDEPENDENT_AMBULATORY_CARE_PROVIDER_SITE_OTHER): Payer: Self-pay

## 2023-12-18 ENCOUNTER — Encounter: Payer: Self-pay | Admitting: Physician Assistant

## 2023-12-18 DIAGNOSIS — E785 Hyperlipidemia, unspecified: Secondary | ICD-10-CM

## 2023-12-18 NOTE — Telephone Encounter (Signed)
Spoke to patient she stated she has had a headache x 1 week, has been alternating tylenol/ibuprofen and even added in 1/2 oxy at times, and Benadryl.  Patient stated she had been out of town, she is waiting on her rinse to come in that Dr Irene Pap prescribed, she stated she had a fever of 102.2 and today was the first time she had noticed that, but with alternating the meds it may have kept it down until now.  She stated she had finished her Levaquin antibiotics.   I asked her if she had tested for Covid, she stated not since she had returned from her trip, I advised her to test for that first since the fever has shown up, if that test was negative I advised her to call us back so we could advise her of next steps

## 2023-12-19 ENCOUNTER — Ambulatory Visit (INDEPENDENT_AMBULATORY_CARE_PROVIDER_SITE_OTHER): Payer: BC Managed Care – PPO | Admitting: Otolaryngology

## 2023-12-19 ENCOUNTER — Encounter (INDEPENDENT_AMBULATORY_CARE_PROVIDER_SITE_OTHER): Payer: Self-pay | Admitting: Otolaryngology

## 2023-12-19 VITALS — BP 143/81 | HR 88

## 2023-12-19 DIAGNOSIS — J343 Hypertrophy of nasal turbinates: Secondary | ICD-10-CM

## 2023-12-19 DIAGNOSIS — J339 Nasal polyp, unspecified: Secondary | ICD-10-CM | POA: Diagnosis not present

## 2023-12-19 DIAGNOSIS — J3089 Other allergic rhinitis: Secondary | ICD-10-CM

## 2023-12-19 DIAGNOSIS — R519 Headache, unspecified: Secondary | ICD-10-CM | POA: Diagnosis not present

## 2023-12-19 DIAGNOSIS — J329 Chronic sinusitis, unspecified: Secondary | ICD-10-CM | POA: Diagnosis not present

## 2023-12-19 DIAGNOSIS — R0981 Nasal congestion: Secondary | ICD-10-CM

## 2023-12-19 DIAGNOSIS — J342 Deviated nasal septum: Secondary | ICD-10-CM

## 2023-12-19 NOTE — Progress Notes (Addendum)
ENT Progress Note  Update 12/19/23: Discussed the use of AI scribe software for clinical note transcription with the patient, who gave verbal consent to proceed.  History of Present Illness   The patient, with a history of revision sinus surgery 2 months ago (1 week post-op following b/l revision FESS (b/l maxillary, b/l ethmoid and frontal sinus (balloon sinuplasty for frontal sinus + stent b/l)) and b/l nasal polyp removal), presents with persistent headaches and anosmia.  The patient experiences persistent headaches located in various areas, including the front and back of the head, occipital area and top of her head The headaches are severe enough to wake them from sleep, and they manage the pain with alternating doses of Tylenol (1000 mg) and ibuprofen every four to five hours. They use Benadryl at night to aid sleep, allowing for approximately six hours of rest. A fever episode with a temperature of 102.30F occurred a few days ago, but there has been no recurrence of fever since then.  They report anosmia, associating it with previous COVID-19 experiences, although recent tests for COVID-19 have been negative.  They describe nasal symptoms, including crusty, yellow, and red discharge primarily from one side, noticeable during morning and nighttime rinses (continues to do nasal rinses post-op). They have recently started using a nasal rinse with an antibiotic, which was delayed due to insurance and delivery issues. They have been on a steroid rinse since post-surgery.  They have a history of sinus surgery and report ongoing inflammation and swelling in the sinuses. They have been using Dupixent, a biologic medication, but are uncertain of its effectiveness in controlling their symptoms.  They manage pain with alternating Tylenol and ibuprofen due to gastrointestinal concerns and have been using oxycodone sparingly.     Initial Evaluation:  Reason for Consult: chronic headaches and sinusitis  on CT max/face    HPI: Lindsey Knight is an 63 y.o. female with hx of chronic nasal congestion, frequent sinus infections for several decades and chronic headaches, here for initial evaluation of her sx.  She has a longstanding history of sinus infections and allergy symptoms dating back several decades ago.  She received allergy shots for years in [place], moved to [facility] and allergy testing was negative, then moved to [place], again allergy skin testing was negative per report . she then started to have recurrent sinus infections, required multiple courses of antibiotics and steroids. She then had sinus surgery 13 yrs ago, and after sinus surgery her sinus infections became less frequent.  She reports that her sinus surgeon did IgE levels at the time and IgA levels were low.  She is on Zyrtec daily and uses Atrovent spray and Flonase nasal spray BID. At night she takes Singulair. She uses albuterol when her symptoms worsen. She took abx and steroids after her most recent bout of sinus infection and currently feeling better.  Denies history of allergies to aspirin or NSAIDs.  She had CT max face/MRI brain ordered due to persistent headache. MRI brain was concerning for potential defect at cribriform plate and bilateral sinusitis.  CT max face was ordered in follow-up and it did not show a defect at the skull base, but demonstrated bilateral sinusitis involving maxillary and ethmoid sinuses and evidence of prior sinus surgery.  Office visit with Dr Derek Jack 05/18/23 Lindsey Knight is a 63 year old woman, never smoker with history of asthma who returns to pulmonary clinic for cough.    She reports sinus drainage with post nasal drip and  some sinus pressure over recent weeks. She has significant cough that can keep her up at night. She is using advair 250-69mcg 1 puff twice daily and as needed albuterol. She is using flonase 1 spray per nostril daily. She continues to have reflux symptoms despite famotidine 20mg   twice daily.    OV 10/09/22 She reports increased cough over the past 2 months. She describes an event where she was bending over in the community garden and possibly had a significant reflux episode. She denies any viral illnesses recently.    She saw GI 08/01/22 with plan to reduce PPI dosing then transition to famotidine for 2 weeks then to use as needed. She reports the famotidine did not help, she reports burning sensation in her chest and throat. She then started omeprazole OTC which seems to have reduced her reflux symptoms.    She has advair 250-61mcg 1 puff twice daily that she has been using as needed. She has albuterol as needed as well. She has not felt much relief.    She has not been on steroids or antibiotics.    OV 03/06/22 Chest x-ray today shows clearing of the right upper lobe infiltrates. Will await final radiology read.    She is using advair 100-50 mcg 1 puff twice daily as needed along with daily allegra and montelukast for her asthma. No night time awakenings. No issues with spring allergies.   She has been feeling well since last visit. He cough has significantly decreased. She recently had EGD and C-scope and started on PPI therapy for GERD.    OV 01/03/22 Patient reports having respiratory illness in mid-December 2022 and was treated with Zpak on 12/13. PCP note from 11/08/2021 reviewed. Chest x-ray on 12/30 showed increased lung markings with mild right mid lung and left basilar linear scaring or atelectasis. She was also noted to have increased platelet count. She had CT Chest on 11/29/21 that showed mediastinal, axillary and hilar lymphadenopathy along with patchy ground-glass and adjacent focal clustered nodularity in the right upper lobe and adjacent band like atelectasis/scarring at the junction of the right upper lobe.    Patient reports resolution of her infectious symptoms from December and is feeling at her baseline. She will occasionally have some right sided  discomfort but it is overall improved from December. She notices the pain when laying down or moving when she sleeps. She denies any shortness of breath, fevers, chills or night sweats. She denies hemoptysis. She does have joint aches in general which include her knees, hips and occasional hand stiffness in the mornings.   She has history of asthma and mainly has issues with reactive airways disease when triggered by viral infections and uses inhalers as needed. She denies issues with seasonal allergies. She had sinus surgery in the past with significant reduction in frequency of sinus infections.    She is a never smoker. She is a retired Engineer, civil (consulting). She has a sister with crohn's disease and her mother had colon cancer.        Office note by Jarold Motto PA Pt c/o headache x 1 week on right side of head behind ear. Took 2 extra strength Tylenol last night with relief. Has pain now  Headache She has been having an intermittent headache since 06/13/23.  She is taking extra strength Tylenol morning and evening with some relief. Took Sudafed two days ago without relief. Pain is located behind right ear. Rates pain 7/10 and describes as sharp throbbing pain. No change  in lifestyle, eating, stress. Denies any recent head trauma. This kind of headache is unusual for her. She is not a "headache" person. Treated recently for maxillary sinusitis on 05/18/23.   Feels somewhat disoriented, especially on the right side of her vision field. 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 She has been taking alendronate for one year - unsure if this is contributing to her symptom(s).    No family history of stroke. Denies chest pain, shortness of breath. No change in bedding, pillows. Has not had her yearly eye exam for this year.   Denies rash. Had bilateral nasal sinus surgery in 2015.  Denies sinus pressure, flu symptoms.   New daily persistent headache Neuro exam is wnl No red  flags on my exam Will obtain MR without contrast to further evaluate given persistence and age >47 If new/worsening symptom(s), likely will need to go to the ER Consider neurology referral    Past Medical History:  Diagnosis Date   Allergy Child   Anemia    Arthritis    Asthma    Eczema    GERD (gastroesophageal reflux disease) 10/22   Hiatal hernia    Osteoporosis    Torn ligament    pt reports torn ligament in right ankle, use caution when turning   Ulcer 6/23   Vaginal delivery    x 2, 1985, 1990    Past Surgical History:  Procedure Laterality Date   COLONOSCOPY  01/23/2022   normal - 2012   LIPOMA EXCISION Right 03/27/2023   back   NASAL SEPTOPLASTY W/ TURBINOPLASTY Bilateral 10/24/2023   Procedure: Septoplasty/inferior turbinate reduction AND BALLOON SINUSPLASTY;  Surgeon: Ashok Croon, MD;  Location: [facility];  Service: ENT;  Laterality: Bilateral;   NASAL SINUS SURGERY Bilateral 2015   OTHER SURGICAL HISTORY     lipoma removed from back 03/2023   SINUS ENDO W/FUSION Bilateral 10/24/2023   Procedure: Functional Endoscopic Sinus Surgery With Navigation;  Surgeon: Ashok Croon, MD;  Location: [facility];  Service: ENT;  Laterality: Bilateral;    Family History  Problem Relation Age of Onset   Colon cancer Mother    Cancer Mother    Hearing loss Mother    Heart failure Father    Asthma Father    COPD Father    Hearing loss Father    Crohn's disease Sister    Pancreatic cancer Other    Ovarian cancer Other    Uterine cancer Other    Allergic rhinitis Grandson    Food Allergy Grandson    Asthma Daughter     Social History:  reports that she has never smoked. She has never been exposed to tobacco smoke. She has never used smokeless tobacco. She reports that she does not currently use alcohol. She reports that she does not currently use drugs after having used the following drugs: Marijuana.  Allergies:  Allergies   Allergen Reactions   Other Hives    Adhesives   Tape Rash    Medications: I have reviewed the patient's current medications.  The PMH, PSH, Medications, Allergies, and SH were reviewed and updated.  ROS: Constitutional: Negative for fever, weight loss and weight gain. Cardiovascular: Negative for chest pain and dyspnea on exertion. Respiratory: Is not experiencing shortness of breath at rest. Gastrointestinal: Negative for nausea and vomiting. Neurological: Negative for headaches. Psychiatric: The patient is not nervous/anxious  Blood pressure (!) 143/81, pulse 88, SpO2 98%.  PHYSICAL EXAM:  Exam: PHYSICAL  EXAM:  Exam: General: Well-developed, well-nourished Respiratory Respiratory effort: Equal inspiration and expiration without stridor Cardiovascular Peripheral Vascular: Warm extremities with equal color/perfusion Eyes: No nystagmus with equal extraocular motion bilaterally Neuro/Psych/Balance: Patient oriented to person, place, and time; Appropriate mood and affect; Gait is intact with no imbalance; Cranial nerves I-XII are intact Head and Face Inspection: Normocephalic and atraumatic without mass or lesion Facial Strength: Facial motility symmetric and full bilaterally ENT Pinna: External ear intact and fully developed External canal: Canal is patent with intact skin External Nose: No scar or anatomic deformity Internal Nose: Minimal crusting/nasal secretions along middle meatus, debrided b/l, no significant purulence noted but left side swabbed for culture. See procedure note for further details.   Lips, Teeth, and gums: Mucosa and teeth intact and viable TMJ: No pain to palpation with full mobility Oral cavity/oropharynx: No erythema or exudate, no lesions present Nasopharynx: No mass or lesion with intact mucosa   Procedure:  PROCEDURE NOTE: nasal endoscopy    Preoperative diagnosis: chronic sinusitis with nasal polyps and hx of FESS  Postoperative  diagnosis: same  Surgeon: Ashok Croon, M.D.  Anesthesia: Topical lidocaine and Afrin  H&P REVIEW: The patient's history and physical were reviewed today prior to procedure. All medications were reviewed and updated as well. Complications: None Condition is stable throughout exam Indications and consent: The patient presents with symptoms of chronic sinusitis not responding to previous therapies. All the risks, benefits, and potential complications were reviewed with the patient preoperatively and informed consent was obtained. The time out was completed with confirmation of the correct procedure.   Procedure: The patient was seated upright in the clinic. Topical lidocaine and Afrin were applied to the nasal cavity. After adequate anesthesia had occurred, the rigid nasal endoscope was passed into the nasal cavity. The nasal mucosa, turbinates, septum, and sinus drainage pathways were visualized bilaterally. Post-operative changes were noted with mild edema along middle meatus with crusting and white thick mucus in maxillary sinuses. There was some erythema and edema noted b/l. On the right side middle turbinate was lateralized and maxillary antrostomy was not clearly seen. The mucosa was intact but inflammed. The scope was then slowly withdrawn and the patient tolerated the procedure well. There were no complications or blood loss.  Studies Reviewed:CT max/face CLINICAL DATA:  Sinus mass on recent MRI.   EXAM: CT sinuses 08/22/23   FINDINGS: Paranasal sinuses:   Frontal: Persistent, near complete opacification bilaterally with associated chronic osteitis.   Ethmoid: Moderate residual air cell opacification bilaterally, predominantly involving anterior air cells and overall greatly improved from prior (particularly posteriorly).   Maxillary: Prominent circumferential, mildly nodular/polypoid mucosal thickening bilaterally with associated partial mineralization of sinus contents and  with chronic osteitis. Improved aeration from prior.   Sphenoid: Minimal mucosal thickening and possible trace fluid bilaterally, improved from prior.   Right ostiomeatal unit: Patent maxillary antrostomy.   Left ostiomeatal unit: Patent maxillary antrostomy.   Nasal passages: Bilateral middle turbinate concha bullosa. Residual mucosal thickening primarily in the anterosuperior aspect of the nasal cavity, improved from prior. Essentially midline nasal septum.   Anatomy: No pneumatization superior to anterior ethmoid notches. Keros II with the left fovea ethmoidalis again noted to be higher than on the right. Sellar sphenoid pneumatization pattern. No dehiscence of carotid or optic canals. No onodi cell.   Other: Clear mastoid air cells and middle ear cavities. Unremarkable appearance of the orbits and included portion of the brain.   IMPRESSION: Overall improved pansinusitis although with persistent near  complete opacification of the frontal sinuses.  CT MAXILLOFACIAL WITH CONTRAST 07/17/23   TECHNIQUE: Multidetector CT imaging of the maxillofacial structures was performed with intravenous contrast. Multiplanar CT image reconstructions were also generated.   RADIATION DOSE REDUCTION: This exam was performed according to the departmental dose-optimization program which includes automated exposure control, adjustment of the mA and/or kV according to patient size and/or use of iterative reconstruction technique.   CONTRAST:  75mL OMNIPAQUE IOHEXOL 300 MG/ML  SOLN   COMPARISON:  Brain MRI 07/10/2023   FINDINGS: Osseous: No fracture or destructive changes   Orbits: Unremarkable   Sinuses: Essentially confluent opacification of maxillary, ethmoid, and frontal sinuses bilaterally with diffuse obstruction of the upper nasal cavity by mucosal thickening. Partially mineralized debris is seen in the maxillary sinuses and extending towards the widened ostia. No clear destructive  changes, asymmetry at the left cribriform plate on prior brain MRI is likely due to the higher left fovea ethmoidalis, a normal variant. No invasive features are seen. The nasal septum is nearly midline.   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 No destructive or invasive features.  MRI Brain EXAM: MRI HEAD WITHOUT CONTRAST   TECHNIQUE: Multiplanar, multiecho pulse sequences of the brain and surrounding structures were obtained without intravenous contrast.   COMPARISON:  None Available.   FINDINGS: Brain: Negative for acute infarct. Small microhemorrhages in the inferomedial right frontal and left frontal lobe. No hydrocephalus. No extra-axial fluid collection. No mass effect. No mass lesion. Sequela of mild chronic microvascular ischemic change.   Vascular: Normal flow voids.   Skull and upper cervical spine: Normal marrow signal.   Sinuses/Orbits: No middle ear or mastoid effusion. There is near-complete opacification of the bilateral maxillary, ethmoid, and frontal sinuses. There is partial opacification of the bilateral sphenoid sinuses. The cribriform plate appears somewhat irregular and there appears to be mass effect on the olfactory sulcus (series 117, image 63). The integrity of the lamina papyracea on the left is also uncertain (series 117, image 56).   Other: None.   IMPRESSION: 1. No acute intracranial abnormality. 2. Near-complete opacification of the bilateral maxillary, ethmoid, and frontal sinuses. The cribriform plate appears somewhat irregular and there appears to be mass effect on the olfactory sulcus. The integrity of the lamina papyracea on the left is also uncertain. Recommend further evaluation with a dedicated contrast-enhanced maxillofacial CT.  Audio 11/12/23 Tympanometry: Right ear: Type A- Normal external ear canal volume with  normal middle ear pressure and tympanic membrane compliance. Left ear: Type A- Normal external ear canal volume with normal middle ear pressure and tympanic membrane compliance.     Pure tone Audiometry: Right ear- Normal hearing from [phone] Hz, then mild to moderate presumably sensorineural 6000-8000 Hz . Left ear-  Normal hearing from [phone] Hz, then mild to moderate presumably sensorineural hearing loss from 4000 Hz - 8000 Hz.    Assessment/Plan: Encounter Diagnoses  Name Primary?   Chronic sinusitis, unspecified location Yes   Nasal polyps    Chronic nasal congestion    Environmental and seasonal allergies    Deviated nasal septum    Hypertrophy of both inferior nasal turbinates    Nonintractable headache, unspecified chronicity pattern, unspecified headache type     Chronic sinusitis with nasal polyps + septal deviation and inferior turbinate hypertrophy - s/p FESS several years ago, with sx recurrence and evidence of severe inflammation bilateral maxillary and ethmoid sinuses -Initially had MRI  brain which showed chronic sinus disease and potential irregularity at cribriform plat, but max face CT done following MRI brain did not reveal any bony defects in the skull base including area of concern along the cribriform plate -I discussed findings of both imaging studies with the patient -Based on my evaluation today including nasal endoscopy she has evidence of polyp regrowth and chronic nasal congestion along with septal deviation inferior turban hypertrophy and purulent secretions along the left middle meatus consistent with chronic rhinosinusitis with nasal polyps Plan  - she had recent CBC with diff, will order total IgE - CT sinuses brainlab protocol - CT max face with fewer images - need for image guidance during surgery  - nasal steroid rinses with [employer] Med nasal saline/mometasone to shrink the polyps and help with sx post-op -  Doxycycline 100 mg BID for sinus infection  and Medrol Dose pack (4 mg tab pack, take as directed)  - Allergy referral for consideration to receive biologic such as Dupixent vs Xolair (hx of asthma) - I discussed management of CRSwNP and importance of surgical options and medical management of allergies and chronic inflammation - we discussed risks and benefits of surgery and she would like to proceed - will schedule for revision sinus surgery (bilateral revision FESS, maxillary antrostomy b/l, vs balloon sinuplasty, bilateral total ethmoidectomy, bilateral nasal polypectomy, possible inferior turbinate reduction, possible septoplasty) -She reports history of normal IgA when she had her initial [place], she inquired about the significance of the test result, I advised her to have immunoglobulin levels checked with an allergist, who can better discussed the significance of it if her levels are abnormal  2. Environmental allergies and hx of asthma -Continue Zyrtec 10 mg daily -Stop Flonase after initiation of nasal steroid rinses -Continue Singulair  3. Asthma  -Continue albuterol as needed -Continue Singulair 10 mg daily -See pulmonary as needed established with Melody Comas, MD   Update 09/05/23 She saw Allergy, and had testing done. She had CT done. She was allergic to aspergillus and a couple of weeds on allergy testing with Dr Selena Batten. Being considered for Dupixent.   Chronic sinusitis with nasal polyps + septal deviation and inferior turbinate hypertrophy - s/p FESS several years ago, with sx recurrence and evidence of severe inflammation bilateral maxillary and ethmoid sinuses on CT max/face a few months ago.  -Initially had MRI brain which showed chronic sinus disease and potential irregularity at cribriform plat, but max face CT done following MRI brain did not reveal any bony defects in the skull base including area of concern along the cribriform plate -I discussed findings of both  imaging studies with the patient -Based on nasal endoscopy during initial office visit she has evidence of polyp regrowth and chronic nasal congestion along with septal deviation inferior turbinate hypertrophy and purulent secretions along the left middle meatus consistent with chronic rhinosinusitis with nasal polyps - took abx and steroids and repeat CT sinuses was done - which showed overall improved paranasal sinus disease with persistent opacification of frontal sinuses partial opacification of maxillary and ethmoid sinuses bilaterally -I reviewed imaging findings with the patient and we will plan for repeat nasal endoscopy in a few weeks prior to her scheduled sinus procedure - She had elevated total IgE   Plan  - continue nasal steroid rinses with [employer]er Med nasal saline/mometasone to shrink the polyps and help with sx post-op - proceed with Dupixent if able to get insurance  approval - I discussed management of CRSwNP and importance of surgical options and medical management of allergies and chronic inflammation - we discussed risks and benefits of surgery and she would like to proceed - will plan for revision sinus surgery (based on imaging results we will plan for bilateral maxillary sinus lavage, removal of the residual ethmoid partitions and frontal balloon sinuplasty and inferior turbinate reduction.  -She reports history of normal IgA when she had her initial workup [place], she inquired about the significance of the test result, I advised her to have immunoglobulin levels checked with an allergist, who can better discussed the significance of it if her levels are abnormal  2. Environmental allergies and hx of asthma -Continue Zyrtec 10 mg daily -continue nasal steroid rinses with Budesonide -Continue Singulair - initiate Dupixent if a candidate and approved by insurance  3. Asthma  -Continue albuterol as needed -Continue Singulair 10 mg  daily -See pulmonary as needed established with Melody Comas, MD  RTC in a few weeks, will plan repeat nasal endoscopy and will discuss details of planned surgery.  Update 10/15/23 Assessment and Plan    Chronic Sinusitis with Nasal Polyps Chronic sinusitis with nasal polyps, previously treated with surgery. Current symptoms include mucus retention, nasal congestion, and polyps observed on examination/CT scan. 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 Dupixent initiated with three injections so far. Plan for less extensive surgery to remove partitions, wash out maxillary sinuses, and address frontal sinuses. Discussed benefits (improved drainage, reduced symptoms), risks, and recovery expectations (shorter than previous surgery). Shared decision-making regarding procedure and continuation of Dupixent. Discussed postoperative care including sinus precautions and minimal packing. - Proceed with revision FESS  - Continue Dupixent injections - Provide postoperative care instructions including sinus precautions and minimal packing - Schedule follow-up appointment on November 05, 2023  Dysphonia and sx of Laryngitis after initial Dupixent shot, voice loss for a few days, likely due to concurrent viral infection vs reflux laryngitis with symptoms of voice changes and throat irritation. No evidence of bacterial laryngitis. Currently takes Pepcid. Recommended a natural supplement to complement Pepcid and reduce reflux symptoms. - Continue Pepcid 20 mg daily  - Recommend Reflux Gourmet supplement to be taken after meals and before bedtime - diet and lifestyle changes to minimize reflux   Follow-up - Follow-up appointment on November 05, 2023 postop - Follow-up with allergist on October 31, 2023.    I spent 30 minutes in total face-to-face time and in reviewing records during pre-charting, more than 50% of which was spent in counseling  and coordination of care, reviewing test results, reviewing medications and treatment regimen and/or in discussing or reviewing the diagnosis, the prognosis and treatment options. Pertinent laboratory and imaging test results that were available during this visit with the patient were reviewed by me and considered in my medical decision making (see chart for details).   Update 11/05/23 2 weeks post-op revision FESS and removal of nasal polyps, doing well   Assessment and Plan     Chronic Sinusitis with Nasal Polyps Postoperative care following sinus surgery with stent placement in the frontal outflow tract. Significant swelling, mucin, and polyps present during the case. Improved breathing with occasional dark, dry blood discharge post-op per report. Crusting observed on nasal endoscopy today and debridement was performed. Culture showed Serratia species (few cells) responsive to all tested abx. Discussed continued nasal irrigation to prevent crusting and promote  healing. Potential addition of antibiotic and steroid to the rinse if crusting persists. We discussed that stents elute steroids for about thirty days and dissolve with rinses.  - Continue nasal rinses three/four times daily - Consider adding a few drops of baby shampoo to the rinse to loosen the crusting - Prescribe budesonide and antibiotic rinse if significant crusting persists at next visit - Schedule follow-up appointment after Christmas, either on Thursday, November 15, 2023, or the following week  Tinnitus Intermittent high-pitched ringing in the ears, possibly due to age-related hearing loss, eustachian tube dysfunction, or nasal congestion post-surgery. No ear wax or fluid observed on exam. - Order hearing test.     Update 11/15/23 Assessment and Plan    Chronic Sinusitis with Nasal Polyps S/p b/l revision FESS 3 weeks ago, reports nasal stuffiness/congestion. Examination reveals significant swelling and secretions,  particularly on the left side, were removed with suction and minimal crusting which was removed with [place].  Adding baby shampoo irrigation appeared to have helped with crusting. Currently on Dupixent for long-term management of inflammation and polyp regrowth. Discussed potential need for oral steroids and antibiotics if symptoms worsen in the next few days. Goal is to limit systemic steroids, but current necessity acknowledged due to significant swelling post-op. Alternative of steroid rinses also discussed - Prescribe oral steroids if symptoms worsen in the next week to be taken if needed - Refill budesonide nasal rinses - ok to start doing a rinse with steroids BID and rinse without steroids 1-2 per day - Schedule follow-up in two weeks  Suspected Laryngospasm Episodes of choking and dyspnea attributed to laryngospasm or vocal cord dysfunction. Explained as an exaggerated reflex to prevent aspiration. Provided instructions on managing episodes using specific breathing techniques. - Instruct to breathe through the nose and out through the mouth during episodes - Advise 'smell the roses and blow out candles' technique to manage laryngospasm  Possible Dupixent Side Effects Reports joint pain and swelling in the finger (R ring finger) after minor trauma 2 weeks ago. Advised to contact allergist to discuss symptoms and determine if related to medication. 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     12/05/23 6 weeks post-op FESS  Nasal endoscopy with debridement done today, overall less inflammation on exam today, with reduced edema along maxillary antrostomy especially on the left. Some crusting present, and small amount of purulent secretions on the left, swabbed for culture. OR cx  sensitivity results show both S.aureus and Serratia, sensitive to Gentamicin    Chronic Sinusitis with Nasal Polyps S/p b/l revision FESS 6 weeks ago, reports nasal stuffiness/congestion.  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 She traveled to [place] and had an episode with a  fever to 102, which resolved with Tylenol. Exam today including nasal endoscopy reveals significant inflammation edema, white thick mucus along maxillary sinus floor on the left, right side with lateralized middle turbinate unable to see maxillary antrostomy well. Differential includes chronic bacterial infection, allergic inflammation, or headache and symptoms unrelated to hx of CRS. Discussed potential need for nasal endoscopy with lavage under anesthesia to address inflammation and obtain cultures/if synechiae is identified on the right side will medialize right middle turbinate at the same time. Explained risks of anesthesia and potential need for future procedures if inflammation persists. - Order CT scan of sinuses without contrast to evaluate for persistent sinus inflammation  - Schedule nasal endoscopy with lavage under anesthesia - Continue rinsing with antibiotic and steroid solution - Prescribe tramadol for pain management  Headaches Persistent headaches, possibly unrelated to sinusitis (had prior to surgery) or due to recurrent sinus inflammation. Headaches waking her up from sleep and  requiring frequent use of acetaminophen and ibuprofen. Differential includes sinus-related headaches, medication overuse headaches, or other etiologies.  - Prescribe tramadol for headache management - CT sinuses to evaluate for persistent sinus inflammation  General Health Maintenance Currently on Dupixent for nasal polyps and inflammation with limited improvement. Insurance coverage for Dupixent is limited. Discussed potential alternatives and the need to consult with the allergy clinic regarding dosing frequency and other biologics. - Discuss alternative biologics with allergy clinic - Consider adjusting Dupixent dosing frequency with allergy clinic  Follow-up - Schedule follow-up appointment after CT scan - Instruct patient to call if symptoms worsen or if severe headaches        Ashok Croon, MD Otolaryngology [facility] Phone: [phone] Fax: [phone]  12/20/2023, 5:50 AM

## 2023-12-20 MED ORDER — TRAMADOL HCL 50 MG PO TABS
50.0000 mg | ORAL_TABLET | Freq: Four times a day (QID) | ORAL | 0 refills | Status: DC | PRN
Start: 2023-12-20 — End: 2024-08-21

## 2023-12-21 ENCOUNTER — Other Ambulatory Visit (INDEPENDENT_AMBULATORY_CARE_PROVIDER_SITE_OTHER): Payer: Self-pay | Admitting: Otolaryngology

## 2023-12-21 DIAGNOSIS — J329 Chronic sinusitis, unspecified: Secondary | ICD-10-CM

## 2023-12-21 DIAGNOSIS — G44039 Episodic paroxysmal hemicrania, not intractable: Secondary | ICD-10-CM

## 2023-12-23 NOTE — Progress Notes (Unsigned)
Follow Up Note  RE: Lindsey Knight MRN: [id number] DOB: [birth year] Date of Office Visit: 12/24/2023  Referring provider: Jarold Motto, PA Primary care provider: Jarold Motto, PA  Chief Complaint: No chief complaint on file.  History of Present Illness: I had the pleasure of seeing Lindsey Knight for a follow up visit at the [facility] on 12/23/2023. She is a 63 y.o. female, who is being followed for allergic rhinitis, nasal polyp, recurrent sinusitis, asthma. Her previous allergy office visit was on 10/22/2023 with Dr. Selena Batten. Today is a regular follow up visit.  Discussed the use of AI scribe software for clinical note transcription with the patient, who gave verbal consent to proceed.  History of Present Illness            ***  Assessment and Plan: Lindsey Knight is a 63 y.o. female with: Seasonal allergic rhinitis due to pollen Allergic rhinitis due to mold Nose polyp Past history - sinus surgery in 2013. Currently on Zyrtec, Singulair, Atrovent, and budesonide nasal rinses. 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 No worsening symptoms after taking NSAIDs. Elevated IgE level. 2024 skin testing positive to weed and mold. Interim history - started Dupixent on 09/13/2023. Improvement noted with Dupixent and budesonide rinses. Sinus surgery scheduled for 10/24/2023. Follow up with ENT as scheduled - surgery.  Continue Dupixent injections every 2 weeks at home.  Continue with budesonide saline wash twice a day. Continue Singulair (montelukast) 10mg  daily at night.   Recurrent sinusitis No recent antibiotics. Keep track of infections and antibiotics use. If persistent will get bloodwork next to look at immune system.    Moderate persistent asthma without complication Past history - managed by pulmonologist with Advair and Albuterol as needed. Asthma exacerbations requiring prednisone courses three times this year. Interim  history - stable with no recent albuterol/prednisone use. Today's spirometry was normal. The Dupixent used for nasal polyps is most likely also helping with her asthma. Consider stepping down therapy at next visit if doing well.  Daily controller medication(s): continue Advair 1 puff twice a day and rinse mouth after each use.  Continue Singulair (montelukast) 10mg  daily at night. May use albuterol rescue inhaler 2 puffs or nebulizer every 4 to 6 hours as needed for shortness of breath, chest tightness, coughing, and wheezing. May use albuterol rescue inhaler 2 puffs 5 to 15 minutes prior to strenuous physical activities. Monitor frequency of use - if you need to use it more than twice per week on a consistent basis let us know.    Assessment and Plan              No follow-ups on file.  No orders of the defined types were placed in this encounter.  Lab Orders  No laboratory test(s) ordered today    Diagnostics: Spirometry:  Tracings reviewed. Her effort: {Blank single:19197::"Good reproducible efforts.","It was hard to get consistent efforts and there is a question as to whether this reflects a maximal maneuver.","Poor effort, data can not be interpreted."} FVC: ***L FEV1: ***L, ***% predicted FEV1/FVC ratio: ***% Interpretation: {Blank single:19197::"Spirometry consistent with mild obstructive disease","Spirometry consistent with moderate obstructive disease","Spirometry consistent with severe obstructive disease","Spirometry consistent with possible restrictive disease","Spirometry consistent with mixed obstructive and restrictive disease","Spirometry uninterpretable due to technique","Spirometry consistent with normal pattern","No overt abnormalities noted given today's efforts"}.  Please see scanned spirometry results for details.  Skin Testing: {Blank single:19197::"Select foods","Environmental allergy panel","Environmental allergy panel and select foods","Food allergy  panel","None","Deferred  due to recent antihistamines use"}. *** Results discussed with patient/family.   Medication List:  Current Outpatient Medications  Medication Sig Dispense Refill   albuterol (VENTOLIN HFA) 108 (90 Base) MCG/ACT inhaler Inhale 1 puff into the lungs every 6 (six) hours as needed.     albuterol (VENTOLIN HFA) 108 (90 Base) MCG/ACT inhaler Inhale 2 puffs into the lungs every 4 (four) hours as needed for wheezing or shortness of breath (coughing fits). 18 g 1   alendronate (FOSAMAX) 70 MG tablet Take 1 tablet (70 mg total) by mouth every 7 (seven) days. Take with a full glass of water on an empty stomach. 12 tablet 4   Ascorbic Acid (VITAMIN C CR) 500 MG TBCR Take 1 tablet by mouth every other day.     benzonatate (TESSALON) 100 MG capsule Take 1 capsule (100 mg total) by mouth 3 (three) times daily as needed. 30 capsule 1   budesonide (PULMICORT) 0.25 MG/2ML nebulizer solution Take 0.25 mg by nebulization 2 (two) times daily. Nasal rinse     Cetirizine HCl (ZYRTEC PO) Take by mouth.     Cholecalciferol (VITAMIN D3 ADULT GUMMIES PO)      Cyanocobalamin (VITAMIN B-12 PO) Take 1,000 mcg by mouth daily in the afternoon.     dupilumab (DUPIXENT) 300 MG/2ML prefilled syringe Inject 300 mg into the skin every 14 (fourteen) days. 4 mL 11   estradiol (ESTRACE VAGINAL) 0.1 MG/GM vaginal cream Place 1 g vaginally 3 (three) times a week. 42.5 g 12   famotidine (PEPCID) 20 MG tablet Take 20 mg by mouth daily.     ferrous sulfate 324 MG TBEC Take 324 mg by mouth every other day.     fluticasone-salmeterol (ADVAIR DISKUS) 250-50 MCG/ACT AEPB Inhale 1 puff into the lungs in the morning and at bedtime. 60 each 6   MAGNESIUM GLYCINATE PO      Melatonin 5 MG CHEW Chew by mouth.     montelukast (SINGULAIR) 10 MG tablet TAKE 1 TABLET BY MOUTH EVERY NIGHT AT BEDTIME 90 tablet 3   Multiple Vitamins-Minerals (MULTIVITAMIN ADULT) CHEW      predniSONE (DELTASONE) 10 MG tablet Take 1 tablet (10 mg  total) by mouth daily. Take 20 mg/day x 7 days then take 10 mg/day for 7 days then stop 21 tablet 0   sulfamethoxazole-trimethoprim (BACTRIM DS) 800-160 MG tablet Take 1 tablet by mouth 2 (two) times daily. 14 tablet 0   traMADol (ULTRAM) 50 MG tablet Take 1 tablet (50 mg total) by mouth every 6 (six) hours as needed for moderate pain (pain score 4-6) or severe pain (pain score 7-10) (headache). 30 tablet 0   traZODone (DESYREL) 100 MG tablet TAKE 1 TABLET BY MOUTH EVERY NIGHT AT BEDTIME 90 tablet 0   No current facility-administered medications for this visit.   Allergies: Allergies  Allergen Reactions   Other Hives    Adhesives   Tape Rash   I reviewed her past medical history, social history, family history, and environmental history and no significant changes have been reported from her previous visit.  Review of Systems  Constitutional:  Negative for appetite change, chills, fever and unexpected weight change.  HENT:  Negative for congestion, postnasal drip and rhinorrhea.   Eyes:  Negative for itching.  Respiratory:  Negative for cough, chest tightness, shortness of breath and wheezing.   Cardiovascular:  Negative for chest pain.  Gastrointestinal:  Negative for abdominal pain.  Genitourinary:  Negative for difficulty urinating.  Skin:  Negative  for rash.  Allergic/Immunologic: Positive for environmental allergies.  Neurological:  Negative for headaches.    Objective: There were no vitals taken for this visit. There is no height or weight on file to calculate BMI. Physical Exam Vitals and nursing note reviewed.  Constitutional:      Appearance: Normal appearance. She is well-developed.  HENT:     Head: Normocephalic and atraumatic.     Right Ear: Tympanic membrane and external ear normal.     Left Ear: Tympanic membrane and external ear normal.     Nose: Congestion present.     Mouth/Throat:     Mouth: Mucous membranes are moist.     Pharynx: Oropharynx is clear.   Eyes:     Conjunctiva/sclera: Conjunctivae normal.  Cardiovascular:     Rate and Rhythm: Normal rate and regular rhythm.     Heart sounds: Normal heart sounds. No murmur heard.    No friction rub. No gallop.  Pulmonary:     Effort: Pulmonary effort is normal.     Breath sounds: Normal breath sounds. No wheezing, rhonchi or rales.  Musculoskeletal:     Cervical back: Neck supple.  Skin:    General: Skin is warm.     Findings: No rash.  Neurological:     Mental Status: She is alert and oriented to person, place, and time.  Psychiatric:        Behavior: Behavior normal.    Previous notes and tests were reviewed. The plan was reviewed with the patient/family, and all questions/concerned were addressed.  It was my pleasure to see Lindsey Knight today and participate in her care. Please feel free to contact me with any questions or concerns.  Sincerely,  Wyline Mood, DO Allergy & Immunology  [facility] office: [phone] [facility] office: [phone]

## 2023-12-24 ENCOUNTER — Other Ambulatory Visit: Payer: Self-pay

## 2023-12-24 ENCOUNTER — Encounter: Payer: Self-pay | Admitting: Allergy

## 2023-12-24 ENCOUNTER — Ambulatory Visit: Payer: BC Managed Care – PPO | Admitting: Allergy

## 2023-12-24 ENCOUNTER — Other Ambulatory Visit (HOSPITAL_COMMUNITY): Payer: Self-pay

## 2023-12-24 VITALS — BP 118/70 | HR 87 | Temp 98.5°F | Resp 17 | Wt 158.1 lb

## 2023-12-24 DIAGNOSIS — J329 Chronic sinusitis, unspecified: Secondary | ICD-10-CM

## 2023-12-24 DIAGNOSIS — J3089 Other allergic rhinitis: Secondary | ICD-10-CM | POA: Diagnosis not present

## 2023-12-24 DIAGNOSIS — R519 Headache, unspecified: Secondary | ICD-10-CM

## 2023-12-24 DIAGNOSIS — J339 Nasal polyp, unspecified: Secondary | ICD-10-CM

## 2023-12-24 DIAGNOSIS — J301 Allergic rhinitis due to pollen: Secondary | ICD-10-CM

## 2023-12-24 DIAGNOSIS — J4540 Moderate persistent asthma, uncomplicated: Secondary | ICD-10-CM

## 2023-12-24 MED ORDER — FLUTICASONE-SALMETEROL 250-50 MCG/ACT IN AEPB: 1.0000 | INHALATION_SPRAY | Freq: Two times a day (BID) | RESPIRATORY_TRACT | 5 refills | Status: DC

## 2023-12-24 NOTE — Patient Instructions (Addendum)
Polyps/rhinitis Follow up with ENT as scheduled.  Continue Dupixent injections every 2 weeks.  Continue sinus washes as per ENT.  Continue Singulair (montelukast) 10mg  daily at night. Make sure you get CT sinus done.   Asthma Daily controller medication(s): continue Advair 1 puff twice a day and rinse mouth after each use.  Continue Singulair (montelukast) 10mg  daily at night. May use albuterol rescue inhaler 2 puffs or nebulizer every 4 to 6 hours as needed for shortness of breath, chest tightness, coughing, and wheezing. May use albuterol rescue inhaler 2 puffs 5 to 15 minutes prior to strenuous physical activities. Monitor frequency of use - if you need to use it more than twice per week on a consistent basis let us know.  Breathing control goals:  Full participation in all desired activities (may need albuterol before activity) Albuterol use two times or less a week on average (not counting use with activity) Cough interfering with sleep two times or less a month Oral steroids no more than once a year No hospitalizations   Infections Keep track of infections and antibiotics use. If persistent will get bloodwork next to look at immune system.   Headaches Monitor symptoms. May want to see PCP as well for this.   Follow up with me in 2 months or sooner if needed.

## 2023-12-24 NOTE — Progress Notes (Signed)
Specialty Pharmacy Refill Coordination Note  Lindsey Knight is a 63 y.o. female contacted today regarding refills of specialty medication(s) Dupilumab (Dupixent)   Patient requested [name] at [facility] at [place] date: 01/01/24   Medication will be filled on 12/31/23.

## 2023-12-26 ENCOUNTER — Ambulatory Visit
Admission: RE | Admit: 2023-12-26 | Discharge: 2023-12-26 | Disposition: A | Discharge status: Home / Self Care | Payer: BC Managed Care – PPO | Source: Ambulatory Visit | Attending: Otolaryngology | Admitting: Otolaryngology

## 2023-12-26 ENCOUNTER — Encounter: Payer: Self-pay | Admitting: Physician Assistant

## 2023-12-26 DIAGNOSIS — G44039 Episodic paroxysmal hemicrania, not intractable: Secondary | ICD-10-CM | POA: Diagnosis not present

## 2023-12-26 DIAGNOSIS — J329 Chronic sinusitis, unspecified: Secondary | ICD-10-CM | POA: Diagnosis not present

## 2023-12-26 MED ORDER — IOPAMIDOL (ISOVUE-300) INJECTION 61%
75.0000 mL | Freq: Once | INTRAVENOUS | Status: AC | PRN
  Administered 2023-12-26: 75 mL via INTRAVENOUS

## 2023-12-31 ENCOUNTER — Other Ambulatory Visit: Payer: Self-pay

## 2024-01-02 ENCOUNTER — Ambulatory Visit (INDEPENDENT_AMBULATORY_CARE_PROVIDER_SITE_OTHER): Payer: BC Managed Care – PPO | Admitting: Otolaryngology

## 2024-01-02 ENCOUNTER — Encounter (INDEPENDENT_AMBULATORY_CARE_PROVIDER_SITE_OTHER): Payer: Self-pay | Admitting: Otolaryngology

## 2024-01-02 VITALS — BP 150/91 | HR 84

## 2024-01-02 DIAGNOSIS — J329 Chronic sinusitis, unspecified: Secondary | ICD-10-CM

## 2024-01-02 DIAGNOSIS — R519 Headache, unspecified: Secondary | ICD-10-CM | POA: Diagnosis not present

## 2024-01-02 DIAGNOSIS — J324 Chronic pansinusitis: Secondary | ICD-10-CM

## 2024-01-02 MED ORDER — TRAMADOL HCL 50 MG PO TABS
50.0000 mg | ORAL_TABLET | Freq: Four times a day (QID) | ORAL | 0 refills | Status: AC | PRN
Start: 2024-01-02 — End: 2024-01-07

## 2024-01-02 NOTE — Progress Notes (Signed)
 ENT Progress Note:  Update 01/02/2024  Discussed the use of AI scribe software for clinical note transcription with the patient, who gave verbal consent to proceed.  History of Present Illness   Lindsey Knight is a 63 year old female with chronic sinusitis with nasal polyps who presents with persistent headaches.  She experiences persistent headaches primarily located in her left eye and at the back of her head.The pain worsens when lying flat, causing her to sleep sitting up. She frequently takes Tylenol. She also uses tramadol and Sudafed, but the headaches persist.  Headaches seem to be worse now. Despite this, she continues to have significant symptoms. She reports a runny nose, a 'horrible taste' in her mouth, and excessive nasal discharge in cold environments. She experiences a yellow-green discharge, sometimes bloody, when rinsing her sinuses.  She mentions a recent fever spike and occasional coughing fits that exacerbate her headaches and cause nausea. She uses Tessalon for the cough.  She is concerned about the long-term use of ibuprofen. Had CT max/face a week ago, report is still pending.   Records Reviewed:  Initial Evaluation  Update 12/19/23: Discussed the use of AI scribe software for clinical note transcription with the patient, who gave verbal consent to proceed.  History of Present Illness   The patient, with a history of revision sinus surgery 2 months ago (1 week post-op following b/l revision FESS (b/l maxillary, b/l ethmoid and frontal sinus (balloon sinuplasty for frontal sinus + stent b/l)) and b/l nasal polyp removal), presents with persistent headaches and anosmia.  The patient experiences persistent headaches located in various areas, including the front and back of the head, occipital area and top of her head The headaches are severe enough to wake them from sleep, and they manage the pain with alternating doses of Tylenol (1000 mg) and ibuprofen every four to five  hours. They use Benadryl at night to aid sleep, allowing for approximately six hours of rest. A fever episode with a temperature of 102.63F occurred a few days ago, but there has been no recurrence of fever since then.  They report anosmia, associating it with previous COVID-19 experiences, although recent tests for COVID-19 have been negative.  They describe nasal symptoms, including crusty, yellow, and red discharge primarily from one side, noticeable during morning and nighttime rinses (continues to do nasal rinses post-op). They have recently started using a nasal rinse with an antibiotic, which was delayed due to insurance and delivery issues. They have been on a steroid rinse since post-surgery.  They have a history of sinus surgery and report ongoing inflammation and swelling in the sinuses. They have been using Dupixent, a biologic medication, but are uncertain of its effectiveness in controlling their symptoms.  They manage pain with alternating Tylenol and ibuprofen due to gastrointestinal concerns and have been using oxycodone sparingly.     Initial Evaluation:  Reason for Consult: chronic headaches and sinusitis on CT max/face    HPI: Lindsey Knight is an 63 y.o. female with hx of chronic nasal congestion, frequent sinus infections for several decades and chronic headaches, here for initial evaluation of her sx.  She has a longstanding history of sinus infections and allergy symptoms dating back several decades ago.  She received allergy shots for years in [place], moved to [facility] and allergy testing was negative, then moved to [place], again allergy skin testing was negative per report . she then started to have recurrent sinus infections, required multiple courses of antibiotics and steroids. She  then had sinus surgery 13 yrs ago, and after sinus surgery her sinus infections became less frequent.  She reports that her sinus surgeon did IgE levels at the time and IgA levels were low.  She is on  Zyrtec daily and uses Atrovent spray and Flonase nasal spray BID. At night she takes Singulair. She uses albuterol when her symptoms worsen. She took abx and steroids after her most recent bout of sinus infection and currently feeling better.  Denies history of allergies to aspirin or NSAIDs.  She had CT max face/MRI brain ordered due to persistent headache. MRI brain was concerning for potential defect at cribriform plate and bilateral sinusitis.  CT max face was ordered in follow-up and it did not show a defect at the skull base, but demonstrated bilateral sinusitis involving maxillary and ethmoid sinuses and evidence of prior sinus surgery.  Office visit with Dr Derek Jack 05/18/23 Lindsey Knight is a 63 year old woman, never smoker with history of asthma who returns to pulmonary clinic for cough.    She reports sinus drainage with post nasal drip and some sinus pressure over recent weeks. She has significant cough that can keep her up at night. She is using advair 250-19mcg 1 puff twice daily and as needed albuterol. She is using flonase 1 spray per nostril daily. She continues to have reflux symptoms despite famotidine 20mg  twice daily.    OV 10/09/22 She reports increased cough over the past 2 months. She describes an event where she was bending over in the community garden and possibly had a significant reflux episode. She denies any viral illnesses recently.    She saw GI 08/01/22 with plan to reduce PPI dosing then transition to famotidine for 2 weeks then to use as needed. She reports the famotidine did not help, she reports burning sensation in her chest and throat. She then started omeprazole OTC which seems to have reduced her reflux symptoms.    She has advair 250-25mcg 1 puff twice daily that she has been using as needed. She has albuterol as needed as well. She has not felt much relief.    She has not been on steroids or antibiotics.    OV 03/06/22 Chest x-ray today shows clearing of the  right upper lobe infiltrates. Will await final radiology read.    She is using advair 100-50 mcg 1 puff twice daily as needed along with daily allegra and montelukast for her asthma. No night time awakenings. No issues with spring allergies.   She has been feeling well since last visit. He cough has significantly decreased. She recently had EGD and C-scope and started on PPI therapy for GERD.    OV 01/03/22 Patient reports having respiratory illness in mid-December 2022 and was treated with Zpak on 12/13. PCP note from 11/08/2021 reviewed. Chest x-ray on 12/30 showed increased lung markings with mild right mid lung and left basilar linear scaring or atelectasis. She was also noted to have increased platelet count. She had CT Chest on 11/29/21 that showed mediastinal, axillary and hilar lymphadenopathy along with patchy ground-glass and adjacent focal clustered nodularity in the right upper lobe and adjacent band like atelectasis/scarring at the junction of the right upper lobe.    Patient reports resolution of her infectious symptoms from December and is feeling at her baseline. She will occasionally have some right sided discomfort but it is overall improved from December. She notices the pain when laying down or moving when she sleeps. She denies any  shortness of breath, fevers, chills or night sweats. She denies hemoptysis. She does have joint aches in general which include her knees, hips and occasional hand stiffness in the mornings.   She has history of asthma and mainly has issues with reactive airways disease when triggered by viral infections and uses inhalers as needed. She denies issues with seasonal allergies. She had sinus surgery in the past with significant reduction in frequency of sinus infections.    She is a never smoker. She is a retired Engineer, civil (consulting). She has a sister with crohn's disease and her mother had colon cancer.        Office note by Jarold Motto PA Pt c/o headache x 1 week  on right side of head behind ear. Took 2 extra strength Tylenol last night with relief. Has pain now  Headache She has been having an intermittent headache since 06/13/23.  She is taking extra strength Tylenol morning and evening with some relief. Took Sudafed two days ago without relief. Pain is located behind right ear. Rates pain 7/10 and describes as sharp throbbing pain. No change in lifestyle, eating, stress. Denies any recent head trauma. This kind of headache is unusual for her. She is not a "headache" person. Treated recently for maxillary sinusitis on 05/18/23.   Feels somewhat disoriented, especially on the right side of her vision field. 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 She has been taking alendronate for one year - unsure if this is contributing to her symptom(s).    No family history of stroke. Denies chest pain, shortness of breath. No change in bedding, pillows. Has not had her yearly eye exam for this year.   Denies rash. Had bilateral nasal sinus surgery in 2015.  Denies sinus pressure, flu symptoms.   New daily persistent headache Neuro exam is wnl No red flags on my exam Will obtain MR without contrast to further evaluate given persistence and age >55 If new/worsening symptom(s), likely will need to go to the ER Consider neurology referral    Past Medical History:  Diagnosis Date   Allergy Child   Anemia    Arthritis    Asthma    Eczema    GERD (gastroesophageal reflux disease) 10/22   Hiatal hernia    Osteoporosis    Torn ligament    pt reports torn ligament in right ankle, use caution when turning   Ulcer 6/23   Vaginal delivery    x 2, 1985, 1990    Past Surgical History:  Procedure Laterality Date   COLONOSCOPY  01/23/2022   normal - 2012   LIPOMA EXCISION Right 03/27/2023   back   NASAL SEPTOPLASTY W/ TURBINOPLASTY Bilateral 10/24/2023   Procedure: Septoplasty/inferior turbinate reduction AND BALLOON  SINUSPLASTY;  Surgeon: Ashok Croon, MD;  Location: [facility];  Service: ENT;  Laterality: Bilateral;   NASAL SINUS SURGERY Bilateral 2015   OTHER SURGICAL HISTORY     lipoma removed from back 03/2023   SINUS ENDO W/FUSION Bilateral 10/24/2023   Procedure: Functional Endoscopic Sinus Surgery With Navigation;  Surgeon: Ashok Croon, MD;  Location: [facility];  Service: ENT;  Laterality: Bilateral;    Family History  Problem Relation Age of Onset   Colon cancer Mother    Cancer Mother    Hearing loss Mother    Heart failure Father    Asthma Father    COPD Father    Hearing loss Father    Crohn's disease Sister  Pancreatic cancer Other    Ovarian cancer Other    Uterine cancer Other    Allergic rhinitis Grandson    Food Allergy Grandson    Asthma Daughter     Social History:  reports that she has never smoked. She has never been exposed to tobacco smoke. She has never used smokeless tobacco. She reports that she does not currently use alcohol. She reports that she does not currently use drugs after having used the following drugs: Marijuana.  Allergies:  Allergies  Allergen Reactions   Other Hives    Adhesives   Tape Rash    Medications: I have reviewed the patient's current medications.  The PMH, PSH, Medications, Allergies, and SH were reviewed and updated.  ROS: Constitutional: Negative for fever, weight loss and weight gain. Cardiovascular: Negative for chest pain and dyspnea on exertion. Respiratory: Is not experiencing shortness of breath at rest. Gastrointestinal: Negative for nausea and vomiting. Neurological: Negative for headaches. Psychiatric: The patient is not nervous/anxious  Blood pressure (!) 150/91, pulse 84, SpO2 97%.  PHYSICAL EXAM:  Exam: PHYSICAL EXAM:  Exam: General: Well-developed, well-nourished Respiratory Respiratory effort: Equal inspiration and expiration without  stridor Cardiovascular Peripheral Vascular: Warm extremities with equal color/perfusion Eyes: No nystagmus with equal extraocular motion bilaterally Neuro/Psych/Balance: Patient oriented to person, place, and time; Appropriate mood and affect; Gait is intact with no imbalance; Cranial nerves I-XII are intact Head and Face Inspection: Normocephalic and atraumatic without mass or lesion Facial Strength: Facial motility symmetric and full bilaterally ENT Pinna: External ear intact and fully developed External canal: Canal is patent with intact skin External Nose: No scar or anatomic deformity Internal Nose: Minimal crusting/nasal secretions along middle meatus, debrided b/l, no significant purulence noted but left side swabbed for culture. See procedure note for further details.   Lips, Teeth, and gums: Mucosa and teeth intact and viable TMJ: No pain to palpation with full mobility Oral cavity/oropharynx: No erythema or exudate, no lesions present Nasopharynx: No mass or lesion with intact mucosa   Procedure:  PROCEDURE NOTE: nasal endoscopy    Preoperative diagnosis: chronic sinusitis with nasal polyps and hx of FESS  Postoperative diagnosis: same  Surgeon: Ashok Croon, M.D.  Anesthesia: Topical lidocaine and Afrin  H&P REVIEW: The patient's history and physical were reviewed today prior to procedure. All medications were reviewed and updated as well. Complications: None Condition is stable throughout exam Indications and consent: The patient presents with symptoms of chronic sinusitis not responding to previous therapies. All the risks, benefits, and potential complications were reviewed with the patient preoperatively and informed consent was obtained. The time out was completed with confirmation of the correct procedure.   Procedure: The patient was seated upright in the clinic. Topical lidocaine and Afrin were applied to the nasal cavity. After adequate anesthesia had  occurred, the rigid nasal endoscope was passed into the nasal cavity. The nasal mucosa, turbinates, septum, and sinus drainage pathways were visualized bilaterally. Post-operative changes were noted with mild edema along middle meatus with crusting and white thick mucus in maxillary sinuses. There was some erythema and edema noted b/l. On the right side middle turbinate was lateralized and maxillary antrostomy was not clearly seen. The mucosa was intact but inflammed. The scope was then slowly withdrawn and the patient tolerated the procedure well. There were no complications or blood loss.  Studies Reviewed:CT max/face CLINICAL DATA:  Sinus mass on recent MRI.   EXAM: CT sinuses 08/22/23   FINDINGS: Paranasal sinuses:   Frontal:  Persistent, near complete opacification bilaterally with associated chronic osteitis.   Ethmoid: Moderate residual air cell opacification bilaterally, predominantly involving anterior air cells and overall greatly improved from prior (particularly posteriorly).   Maxillary: Prominent circumferential, mildly nodular/polypoid mucosal thickening bilaterally with associated partial mineralization of sinus contents and with chronic osteitis. Improved aeration from prior.   Sphenoid: Minimal mucosal thickening and possible trace fluid bilaterally, improved from prior.   Right ostiomeatal unit: Patent maxillary antrostomy.   Left ostiomeatal unit: Patent maxillary antrostomy.   Nasal passages: Bilateral middle turbinate concha bullosa. Residual mucosal thickening primarily in the anterosuperior aspect of the nasal cavity, improved from prior. Essentially midline nasal septum.   Anatomy: No pneumatization superior to anterior ethmoid notches. Keros II with the left fovea ethmoidalis again noted to be higher than on the right. Sellar sphenoid pneumatization pattern. No dehiscence of carotid or optic canals. No onodi cell.   Other: Clear mastoid air cells and  middle ear cavities. Unremarkable appearance of the orbits and included portion of the brain.   IMPRESSION: Overall improved pansinusitis although with persistent near complete opacification of the frontal sinuses.  CT MAXILLOFACIAL WITH CONTRAST 07/17/23   TECHNIQUE: Multidetector CT imaging of the maxillofacial structures was performed with intravenous contrast. Multiplanar CT image reconstructions were also generated.   RADIATION DOSE REDUCTION: This exam was performed according to the departmental dose-optimization program which includes automated exposure control, adjustment of the mA and/or kV according to patient size and/or use of iterative reconstruction technique.   CONTRAST:  75mL OMNIPAQUE IOHEXOL 300 MG/ML  SOLN   COMPARISON:  Brain MRI 07/10/2023   FINDINGS: Osseous: No fracture or destructive changes   Orbits: Unremarkable   Sinuses: Essentially confluent opacification of maxillary, ethmoid, and frontal sinuses bilaterally with diffuse obstruction of the upper nasal cavity by mucosal thickening. Partially mineralized debris is seen in the maxillary sinuses and extending towards the widened ostia. No clear destructive changes, asymmetry at the left cribriform plate on prior brain MRI is likely due to the higher left fovea ethmoidalis, a normal variant. No invasive features are seen. The nasal septum is nearly midline.   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 No destructive or invasive features.  MRI Brain EXAM: MRI HEAD WITHOUT CONTRAST   TECHNIQUE: Multiplanar, multiecho pulse sequences of the brain and surrounding structures were obtained without intravenous contrast.   COMPARISON:  None Available.   FINDINGS: Brain: Negative for acute infarct. Small microhemorrhages in the inferomedial right frontal and left frontal lobe. No  hydrocephalus. No extra-axial fluid collection. No mass effect. No mass lesion. Sequela of mild chronic microvascular ischemic change.   Vascular: Normal flow voids.   Skull and upper cervical spine: Normal marrow signal.   Sinuses/Orbits: No middle ear or mastoid effusion. There is near-complete opacification of the bilateral maxillary, ethmoid, and frontal sinuses. There is partial opacification of the bilateral sphenoid sinuses. The cribriform plate appears somewhat irregular and there appears to be mass effect on the olfactory sulcus (series 117, image 63). The integrity of the lamina papyracea on the left is also uncertain (series 117, image 56).   Other: None.   IMPRESSION: 1. No acute intracranial abnormality. 2. Near-complete opacification of the bilateral maxillary, ethmoid, and frontal sinuses. The cribriform plate appears somewhat irregular and there appears to be mass effect on the olfactory sulcus. The integrity of the lamina papyracea on the left is also uncertain. Recommend further evaluation with a  dedicated contrast-enhanced maxillofacial CT.  Audio 11/12/23 Tympanometry: Right ear: Type A- Normal external ear canal volume with normal middle ear pressure and tympanic membrane compliance. Left ear: Type A- Normal external ear canal volume with normal middle ear pressure and tympanic membrane compliance.     Pure tone Audiometry: Right ear- Normal hearing from [phone] Hz, then mild to moderate presumably sensorineural 6000-8000 Hz . Left ear-  Normal hearing from [phone] Hz, then mild to moderate presumably sensorineural hearing loss from 4000 Hz - 8000 Hz.    Assessment/Plan: Encounter Diagnoses  Name Primary?   Chronic pansinusitis Yes   Sinusitis with nasal polyps    Nonintractable headache, unspecified chronicity pattern, unspecified headache type      Chronic sinusitis with nasal polyps + septal deviation and inferior turbinate hypertrophy - s/p  FESS several years ago, with sx recurrence and evidence of severe inflammation bilateral maxillary and ethmoid sinuses -Initially had MRI brain which showed chronic sinus disease and potential irregularity at cribriform plat, but max face CT done following MRI brain did not reveal any bony defects in the skull base including area of concern along the cribriform plate -I discussed findings of both imaging studies with the patient -Based on my evaluation today including nasal endoscopy she has evidence of polyp regrowth and chronic nasal congestion along with septal deviation inferior turban hypertrophy and purulent secretions along the left middle meatus consistent with chronic rhinosinusitis with nasal polyps Plan  - she had recent CBC with diff, will order total IgE - CT sinuses brainlab protocol - CT max face with fewer images - need for image guidance during surgery  - nasal steroid rinses with [employer] [facility] nasal saline/mometasone to shrink the polyps and help with sx post-op -  Doxycycline 100 mg BID for sinus infection and Medrol Dose pack (4 mg tab pack, take as directed)  - Allergy referral for consideration to receive biologic such as Dupixent vs Xolair (hx of asthma) - I discussed management of CRSwNP and importance of surgical options and medical management of allergies and chronic inflammation - we discussed risks and benefits of surgery and she would like to proceed - will schedule for revision sinus surgery (bilateral revision FESS, maxillary antrostomy b/l, vs balloon sinuplasty, bilateral total ethmoidectomy, bilateral nasal polypectomy, possible inferior turbinate reduction, possible septoplasty) -She reports history of normal IgA when she had her initial [place], she inquired about the significance of the test result, I advised her to have immunoglobulin levels checked with an allergist, who can better discussed the significance of it if her  levels are abnormal  2. Environmental allergies and hx of asthma -Continue Zyrtec 10 mg daily -Stop Flonase after initiation of nasal steroid rinses -Continue Singulair  3. Asthma  -Continue albuterol as needed -Continue Singulair 10 mg daily -See pulmonary as needed established with Melody Comas, MD   Update 09/05/23 She saw Allergy, and had testing done. She had CT done. She was allergic to aspergillus and a couple of weeds on allergy testing with Dr Selena Batten. Being considered for Dupixent.   Chronic sinusitis with nasal polyps + septal deviation and inferior turbinate hypertrophy - s/p FESS several years ago, with sx recurrence and evidence of severe inflammation bilateral maxillary and ethmoid sinuses on CT max/face a few months ago.  -Initially had MRI brain which showed chronic sinus disease and potential irregularity at cribriform plat, but max face CT done following MRI brain did not reveal any bony  defects in the skull base including area of concern along the cribriform plate -I discussed findings of both imaging studies with the patient -Based on nasal endoscopy during initial office visit she has evidence of polyp regrowth and chronic nasal congestion along with septal deviation inferior turbinate hypertrophy and purulent secretions along the left middle meatus consistent with chronic rhinosinusitis with nasal polyps - took abx and steroids and repeat CT sinuses was done - which showed overall improved paranasal sinus disease with persistent opacification of frontal sinuses partial opacification of maxillary and ethmoid sinuses bilaterally -I reviewed imaging findings with the patient and we will plan for repeat nasal endoscopy in a few weeks prior to her scheduled sinus procedure - She had elevated total IgE   Plan  - continue nasal steroid rinses with [employer] Med nasal saline/mometasone to shrink the polyps and help with sx post-op - proceed with Dupixent if able to get insurance  approval - I discussed management of CRSwNP and importance of surgical options and medical management of allergies and chronic inflammation - we discussed risks and benefits of surgery and she would like to proceed - will plan for revision sinus surgery (based on imaging results we will plan for bilateral maxillary sinus lavage, removal of the residual ethmoid partitions and frontal balloon sinuplasty and inferior turbinate reduction.  -She reports history of normal IgA when she had her initial work[place], she inquired about the significance of the test result, I advised her to have immunoglobulin levels checked with an allergist, who can better discussed the significance of it if her levels are abnormal  2. Environmental allergies and hx of asthma -Continue Zyrtec 10 mg daily -continue nasal steroid rinses with Budesonide -Continue Singulair - initiate Dupixent if a candidate and approved by insurance  3. Asthma  -Continue albuterol as needed -Continue Singulair 10 mg daily -See pulmonary as needed established with Melody Comas, MD  RTC in a few weeks, will plan repeat nasal endoscopy and will discuss details of planned surgery.  Update 10/15/23 Assessment and Plan    Chronic Sinusitis with Nasal Polyps Chronic sinusitis with nasal polyps, previously treated with surgery. Current symptoms include mucus retention, nasal congestion, and polyps observed on examination/CT scan. 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 Dupixent initiated with three injections so far. Plan for less extensive surgery to remove partitions, wash out maxillary sinuses, and address frontal sinuses. Discussed benefits (improved drainage, reduced symptoms), risks, and recovery expectations (shorter than previous surgery). Shared decision-making regarding procedure and continuation of Dupixent. Discussed  postoperative care including sinus precautions and minimal packing. - Proceed with revision FESS  - Continue Dupixent injections - Provide postoperative care instructions including sinus precautions and minimal packing - Schedule follow-up appointment on November 05, 2023  Dysphonia and sx of Laryngitis after initial Dupixent shot, voice loss for a few days, likely due to concurrent viral infection vs reflux laryngitis with symptoms of voice changes and throat irritation. No evidence of bacterial laryngitis. Currently takes Pepcid. Recommended a natural supplement to complement Pepcid and reduce reflux symptoms. - Continue Pepcid 20 mg daily  - Recommend Reflux Gourmet supplement to be taken after meals and before bedtime - diet and lifestyle changes to minimize reflux   Follow-up - Follow-up appointment on November 05, 2023 postop - Follow-up with allergist on October 31, 2023.    I spent 30 minutes in total face-to-face time and in reviewing  records during pre-charting, more than 50% of which was spent in counseling and coordination of care, reviewing test results, reviewing medications and treatment regimen and/or in discussing or reviewing the diagnosis, the prognosis and treatment options. Pertinent laboratory and imaging test results that were available during this visit with the patient were reviewed by me and considered in my medical decision making (see chart for details).   Update 11/05/23 2 weeks post-op revision FESS and removal of nasal polyps, doing well   Assessment and Plan     Chronic Sinusitis with Nasal Polyps Postoperative care following sinus surgery with stent placement in the frontal outflow tract. Significant swelling, mucin, and polyps present during the case. Improved breathing with occasional dark, dry blood discharge post-op per report. Crusting observed on nasal endoscopy today and debridement was performed. Culture showed Serratia species (few cells) responsive  to all tested abx. Discussed continued nasal irrigation to prevent crusting and promote healing. Potential addition of antibiotic and steroid to the rinse if crusting persists. We discussed that stents elute steroids for about thirty days and dissolve with rinses.  - Continue nasal rinses three/four times daily - Consider adding a few drops of baby shampoo to the rinse to loosen the crusting - Prescribe budesonide and antibiotic rinse if significant crusting persists at next visit - Schedule follow-up appointment after Christmas, either on Thursday, November 15, 2023, or the following week  Tinnitus Intermittent high-pitched ringing in the ears, possibly due to age-related hearing loss, eustachian tube dysfunction, or nasal congestion post-surgery. No ear wax or fluid observed on exam. - Order hearing test.     Update 11/15/23 Assessment and Plan    Chronic Sinusitis with Nasal Polyps S/p b/l revision FESS 3 weeks ago, reports nasal stuffiness/congestion. Examination reveals significant swelling and secretions, particularly on the left side, were removed with suction and minimal crusting which was removed with [place].  Adding baby shampoo irrigation appeared to have helped with crusting. Currently on Dupixent for long-term management of inflammation and polyp regrowth. Discussed potential need for oral steroids and antibiotics if symptoms worsen in the next few days. Goal is to limit systemic steroids, but current necessity acknowledged due to significant swelling post-op. Alternative of steroid rinses also discussed - Prescribe oral steroids if symptoms worsen in the next week to be taken if needed - Refill budesonide nasal rinses - ok to start doing a rinse with steroids BID and rinse without steroids 1-2 per day - Schedule follow-up in two weeks  Suspected Laryngospasm Episodes of choking and dyspnea attributed to laryngospasm or vocal cord dysfunction. Explained as an exaggerated reflex  to prevent aspiration. Provided instructions on managing episodes using specific breathing techniques. - Instruct to breathe through the nose and out through the mouth during episodes - Advise 'smell the roses and blow out candles' technique to manage laryngospasm  Possible Dupixent Side Effects Reports joint pain and swelling in the finger (R ring finger) after minor trauma 2 weeks ago. Advised to contact allergist to discuss symptoms and determine if related to medication. 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     12/05/23 6 weeks post-op FESS  Nasal endoscopy with debridement done today, overall less inflammation on exam today, with reduced edema along maxillary antrostomy especially on the left.  Some crusting present, and small amount of purulent secretions on the left, swabbed for culture. OR cx sensitivity results show both S.aureus and Serratia, sensitive to Gentamicin    Chronic Sinusitis with Nasal Polyps S/p b/l revision FESS 6 weeks ago, reports nasal stuffiness/congestion.  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 She traveled to [place] and had an episode with a fever to 102, which resolved with Tylenol. Exam today including nasal endoscopy reveals  significant inflammation edema, white thick mucus along maxillary sinus floor on the left, right side with lateralized middle turbinate unable to see maxillary antrostomy well. Differential includes chronic bacterial infection, allergic inflammation, or headache and symptoms unrelated to hx of CRS. Discussed potential need for nasal endoscopy with lavage under anesthesia to address inflammation and obtain cultures/if synechiae is identified on the right side will medialize right middle turbinate at the same time. Explained risks of anesthesia and potential need for future procedures if inflammation persists. - Order CT scan of sinuses without contrast to evaluate for persistent sinus inflammation  - Schedule nasal endoscopy with lavage under anesthesia - Continue rinsing with antibiotic and steroid solution - Prescribe tramadol for pain management  Headaches Persistent headaches, possibly unrelated to sinusitis (had prior to surgery) or due to recurrent sinus inflammation. Headaches waking her up from sleep and requiring frequent use of acetaminophen and ibuprofen. Differential includes sinus-related headaches, medication overuse headaches, or other etiologies.  - Prescribe tramadol for headache management - CT sinuses to evaluate for persistent sinus inflammation  General Health Maintenance Currently on Dupixent for nasal polyps and inflammation with limited improvement. Insurance coverage for Dupixent is limited. Discussed potential alternatives and the need to consult with the allergy clinic regarding dosing frequency and other biologics. - Discuss alternative biologics with allergy clinic - Consider adjusting Dupixent dosing frequency with allergy clinic  Follow-up - Schedule follow-up appointment after CT scan - Instruct patient to call if symptoms worsen or if severe headaches     Update 01/02/2024 Assessment and Plan    Chronic Sinusitis with Recurrent Headaches Persistent headaches in  the frontal and periorbital regions, exacerbated by lying flat. Post-sinus surgery with drug-eluting stent placement but continues to experience significant symptoms, including rhinorrhea, yellow-green nasal discharge, and occasional fever. On Tylenol/Motrin and Tramadol. CT max/face with pending report but imaging indicative of chronic sinusitis. Will contact radiology to expedite interpretation. We discussed a need for referral to Rhinology at [facility] for revision sinus surgery/Draf III procedure and further management 2/2 sx recurrence.  - Refer to rhinology at [facility] - Provide a refill of Tramadol (20 tablets) - Advise to continue current medications and nasal rinses  Follow-up - Refer to a rhinologist at [facility] if indicated by the scan results.  - f/u with me after consultation with Dr Hollace Hayward if needed.   I spent 30 minutes in total face-to-face time and in reviewing records during pre-charting, more than 50% of which was spent  in counseling and coordination of care, reviewing test results, reviewing medications and treatment regimen and/or in discussing or reviewing the diagnosis, the prognosis and treatment options. Pertinent laboratory and imaging test results that were available during this visit with the patient were reviewed by me and considered in my medical decision making (see chart for details).     Ashok Croon, MD Otolaryngology [facility] Phone: [phone] Fax: [phone]  01/02/2024, 5:03 PM

## 2024-01-03 ENCOUNTER — Telehealth (INDEPENDENT_AMBULATORY_CARE_PROVIDER_SITE_OTHER): Payer: Self-pay | Admitting: Otolaryngology

## 2024-01-16 ENCOUNTER — Other Ambulatory Visit: Payer: Self-pay

## 2024-01-16 ENCOUNTER — Telehealth (INDEPENDENT_AMBULATORY_CARE_PROVIDER_SITE_OTHER): Payer: Self-pay

## 2024-01-16 ENCOUNTER — Telehealth: Payer: Self-pay | Admitting: Otolaryngology

## 2024-01-16 NOTE — Telephone Encounter (Signed)
 Patient came by the office and stated she needed a refill on her Budesonide nasal rinse, she stated she had tried to call and kept getting [facility].  I told her I would send in her request today.    I faxed her request to [facility]

## 2024-01-16 NOTE — Telephone Encounter (Signed)
 Pt came in and said she left information about her medication on ENT clinical, pt is up front

## 2024-01-22 ENCOUNTER — Other Ambulatory Visit: Payer: Self-pay

## 2024-01-22 ENCOUNTER — Other Ambulatory Visit (HOSPITAL_COMMUNITY): Payer: Self-pay

## 2024-01-22 NOTE — Progress Notes (Signed)
 Specialty Pharmacy Refill Coordination Note  Lindsey Knight is a 63 y.o. female contacted today regarding refills of specialty medication(s) Dupilumab (Dupixent)   Patient requested (Patient-Rptd) Pickup at [facility] at [facility] date: (Patient-Rptd) 01/29/24   Medication will be filled on 03.10.25.

## 2024-01-23 DIAGNOSIS — J324 Chronic pansinusitis: Secondary | ICD-10-CM | POA: Diagnosis not present

## 2024-01-23 DIAGNOSIS — R0982 Postnasal drip: Secondary | ICD-10-CM | POA: Diagnosis not present

## 2024-01-23 DIAGNOSIS — J329 Chronic sinusitis, unspecified: Secondary | ICD-10-CM | POA: Diagnosis not present

## 2024-01-23 DIAGNOSIS — R590 Localized enlarged lymph nodes: Secondary | ICD-10-CM | POA: Diagnosis not present

## 2024-01-25 ENCOUNTER — Other Ambulatory Visit: Payer: Self-pay

## 2024-01-25 DIAGNOSIS — J324 Chronic pansinusitis: Secondary | ICD-10-CM | POA: Diagnosis not present

## 2024-01-28 ENCOUNTER — Other Ambulatory Visit (HOSPITAL_COMMUNITY): Payer: Self-pay

## 2024-02-04 ENCOUNTER — Ambulatory Visit (INDEPENDENT_AMBULATORY_CARE_PROVIDER_SITE_OTHER)

## 2024-02-04 ENCOUNTER — Encounter: Payer: Self-pay | Admitting: Physician Assistant

## 2024-02-04 ENCOUNTER — Encounter: Payer: Self-pay | Admitting: Allergy

## 2024-02-04 ENCOUNTER — Ambulatory Visit: Admitting: Physician Assistant

## 2024-02-04 VITALS — BP 128/80 | HR 89 | Temp 97.9°F | Ht 63.0 in | Wt 151.0 lb

## 2024-02-04 DIAGNOSIS — E785 Hyperlipidemia, unspecified: Secondary | ICD-10-CM

## 2024-02-04 DIAGNOSIS — J9811 Atelectasis: Secondary | ICD-10-CM | POA: Diagnosis not present

## 2024-02-04 DIAGNOSIS — R229 Localized swelling, mass and lump, unspecified: Secondary | ICD-10-CM

## 2024-02-04 DIAGNOSIS — L52 Erythema nodosum: Secondary | ICD-10-CM | POA: Diagnosis not present

## 2024-02-04 DIAGNOSIS — Q765 Cervical rib: Secondary | ICD-10-CM | POA: Diagnosis not present

## 2024-02-04 LAB — COMPREHENSIVE METABOLIC PANEL
ALT: 12 U/L (ref 0–35)
AST: 17 U/L (ref 0–37)
Albumin: 4.2 g/dL (ref 3.5–5.2)
Alkaline Phosphatase: 62 U/L (ref 39–117)
BUN: 11 mg/dL (ref 6–23)
CO2: 26 meq/L (ref 19–32)
Calcium: 9.1 mg/dL (ref 8.4–10.5)
Chloride: 104 meq/L (ref 96–112)
Creatinine, Ser: 0.66 mg/dL (ref 0.40–1.20)
GFR: 93.15 mL/min (ref >60.00)
Glucose, Bld: 114 mg/dL — ABNORMAL HIGH (ref 70–99)
Potassium: 3.9 meq/L (ref 3.5–5.1)
Sodium: 136 meq/L (ref 135–145)
Total Bilirubin: 0.5 mg/dL (ref 0.2–1.2)
Total Protein: 7.5 g/dL (ref 6.0–8.3)

## 2024-02-04 LAB — CBC WITH DIFFERENTIAL/PLATELET
Basophils Absolute: 0 10*3/uL (ref 0.0–0.1)
Basophils Relative: 0.5 % (ref 0.0–3.0)
Eosinophils Absolute: 0.1 10*3/uL (ref 0.0–0.7)
Eosinophils Relative: 1.1 % (ref 0.0–5.0)
HCT: 38.9 % (ref 36.0–46.0)
Hemoglobin: 13.3 g/dL (ref 12.0–15.0)
Lymphocytes Relative: 33.9 % (ref 12.0–46.0)
Lymphs Abs: 2.6 10*3/uL (ref 0.7–4.0)
MCHC: 34.1 g/dL (ref 30.0–36.0)
MCV: 87.9 fl (ref 78.0–100.0)
Monocytes Absolute: 0.6 10*3/uL (ref 0.1–1.0)
Monocytes Relative: 7.3 % (ref 3.0–12.0)
Neutro Abs: 4.3 10*3/uL (ref 1.4–7.7)
Neutrophils Relative %: 57.2 % (ref 43.0–77.0)
Platelets: 371 10*3/uL (ref 150.0–400.0)
RBC: 4.43 Mil/uL (ref 3.87–5.11)
RDW: 13.6 % (ref 11.5–15.5)
WBC: 7.6 10*3/uL (ref 4.0–10.5)

## 2024-02-04 LAB — URINALYSIS, ROUTINE W REFLEX MICROSCOPIC
Bilirubin Urine: NEGATIVE
Hgb urine dipstick: NEGATIVE
Ketones, ur: NEGATIVE
Nitrite: NEGATIVE
Specific Gravity, Urine: 1.015 (ref 1.000–1.030)
Urine Glucose: NEGATIVE
Urobilinogen, UA: 0.2 (ref 0.0–1.0)
pH: 6.5 (ref 5.0–8.0)

## 2024-02-04 LAB — LIPID PANEL
Cholesterol: 162 mg/dL (ref 0–200)
HDL: 47.6 mg/dL (ref >39.00)
LDL Cholesterol: 97 mg/dL (ref 0–99)
NonHDL: 114.57
Total CHOL/HDL Ratio: 3
Triglycerides: 86 mg/dL (ref 0.0–149.0)
VLDL: 17.2 mg/dL (ref 0.0–40.0)

## 2024-02-04 NOTE — Progress Notes (Addendum)
 Lindsey Knight is a 63 y.o. female here for a new problem.  History of Present Illness:   Chief Complaint  Patient presents with   Mass    Pt c/o feeling lumps in both lower legs, painful, x several weeks.    HPI  Lumps on lower legs: Pt complains of "hard knots" on her lower legs bilaterally, ongoing for several weeks.   Endorses tenderness on her lower legs/shins.  Reports the pain has increased recently.  States she first noticed this when she put her leg up on a foot stool.  Has tried elevating her legs with a pillow at night.  Was managing her pain with Extra-Strength Tylenol  Pt is unable to take NSAIDs regularly per gastroenterology due to GERD. Denies any masses on her thighs or upper legs.   Of note, pt is on Dupixent 300 mg every 14 days -- managed by her allergist. She has had variety of labs drawn by her allergist.  Has been seeing a specialist at [facility] for a consult of her sinus issues.  Per chart review, cultures from her sinus surgery showed staph aureus and serratia marcescens identified in her cultures that were treated with nasal washes, per patient.  Past Medical History:  Diagnosis Date   Allergy Child   Anemia    Arthritis    Asthma    Eczema    GERD (gastroesophageal reflux disease) 10/22   Hiatal hernia    Osteoporosis    Torn ligament    pt reports torn ligament in right ankle, use caution when turning   Ulcer 6/23   Vaginal delivery    x 2, 1985, 1990     Social History   Tobacco Use   Smoking status: Never    Passive exposure: Never   Smokeless tobacco: Never  Vaping Use   Vaping status: Never Used  Substance Use Topics   Alcohol use: Not Currently   Drug use: Not Currently    Types: Marijuana    Comment: THC for sleep    Past Surgical History:  Procedure Laterality Date   COLONOSCOPY  01/23/2022   normal - 2012   LIPOMA EXCISION Right 03/27/2023   back   NASAL SEPTOPLASTY W/ TURBINOPLASTY Bilateral 10/24/2023    Procedure: Septoplasty/inferior turbinate reduction AND BALLOON SINUSPLASTY;  Surgeon: Ashok Croon, MD;  Location: [facility];  Service: ENT;  Laterality: Bilateral;   NASAL SINUS SURGERY Bilateral 2015   OTHER SURGICAL HISTORY     lipoma removed from back 03/2023   SINUS ENDO W/FUSION Bilateral 10/24/2023   Procedure: Functional Endoscopic Sinus Surgery With Navigation;  Surgeon: Ashok Croon, MD;  Location: [facility];  Service: ENT;  Laterality: Bilateral;    Family History  Problem Relation Age of Onset   Colon cancer Mother    Cancer Mother    Hearing loss Mother    Heart failure Father    Asthma Father    COPD Father    Hearing loss Father    Crohn's disease Sister    Pancreatic cancer Other    Ovarian cancer Other    Uterine cancer Other    Allergic rhinitis Grandson    Food Allergy Grandson    Asthma Daughter     Allergies  Allergen Reactions   Other Hives    Adhesives   Tape Rash    Current Medications:   Current Outpatient Medications:    albuterol (VENTOLIN HFA) 108 (90 Base) MCG/ACT inhaler, Inhale 2 puffs  into the lungs every 4 (four) hours as needed for wheezing or shortness of breath (coughing fits)., Disp: 18 g, Rfl: 1   alendronate (FOSAMAX) 70 MG tablet, Take 1 tablet (70 mg total) by mouth every 7 (seven) days. Take with a full glass of water on an empty stomach., Disp: 12 tablet, Rfl: 4   Ascorbic Acid (VITAMIN C CR) 500 MG TBCR, Take 1 tablet by mouth every other day., Disp: , Rfl:    benzonatate (TESSALON) 100 MG capsule, Take 1 capsule (100 mg total) by mouth 3 (three) times daily as needed., Disp: 30 capsule, Rfl: 1   budesonide (PULMICORT) 0.25 MG/2ML nebulizer solution, Take 0.25 mg by nebulization 2 (two) times daily. Nasal rinse, Disp: , Rfl:    Cetirizine HCl (ZYRTEC PO), Take by mouth., Disp: , Rfl:    Cholecalciferol (VITAMIN D3 ADULT GUMMIES PO), , Disp: , Rfl:    Cyanocobalamin (VITAMIN B-12 PO), Take  1,000 mcg by mouth daily in the afternoon., Disp: , Rfl:    dupilumab (DUPIXENT) 300 MG/2ML prefilled syringe, Inject 300 mg into the skin every 14 (fourteen) days., Disp: 4 mL, Rfl: 11   estradiol (ESTRACE VAGINAL) 0.1 MG/GM vaginal cream, Place 1 g vaginally 3 (three) times a week., Disp: 42.5 g, Rfl: 12   famotidine (PEPCID) 20 MG tablet, Take 20 mg by mouth daily., Disp: , Rfl:    ferrous sulfate 324 MG TBEC, Take 324 mg by mouth every other day., Disp: , Rfl:    fluticasone-salmeterol (ADVAIR) 250-50 MCG/ACT AEPB, Inhale 1 puff into the lungs in the morning and at bedtime. Rinse mouth after each use., Disp: 60 each, Rfl: 5   MAGNESIUM GLYCINATE PO, , Disp: , Rfl:    Melatonin 5 MG CHEW, Chew by mouth., Disp: , Rfl:    montelukast (SINGULAIR) 10 MG tablet, TAKE 1 TABLET BY MOUTH EVERY NIGHT AT BEDTIME, Disp: 90 tablet, Rfl: 3   Multiple Vitamins-Minerals (MULTIVITAMIN ADULT) CHEW, , Disp: , Rfl:    traMADol (ULTRAM) 50 MG tablet, Take 1 tablet (50 mg total) by mouth every 6 (six) hours as needed for moderate pain (pain score 4-6) or severe pain (pain score 7-10) (headache)., Disp: 30 tablet, Rfl: 0   traZODone (DESYREL) 100 MG tablet, TAKE 1 TABLET BY MOUTH EVERY NIGHT AT BEDTIME, Disp: 90 tablet, Rfl: 0   Review of Systems:   Negative unless otherwise specified per HPI.  Vitals:   Vitals:   02/04/24 0859  BP: 128/80  Pulse: 89  Temp: 97.9 F (36.6 C)  TempSrc: Temporal  SpO2: 96%  Weight: 151 lb (68.5 kg)  Height: 5\' 3"  (1.6 m)     Body mass index is 26.75 kg/m.  Physical Exam:   Physical Exam Constitutional:      Appearance: Normal appearance. She is well-developed.  HENT:     Head: Normocephalic and atraumatic.  Eyes:     General: Lids are normal.     Extraocular Movements: Extraocular movements intact.     Conjunctiva/sclera: Conjunctivae normal.  Pulmonary:     Effort: Pulmonary effort is normal.  Musculoskeletal:        General: Normal range of motion.      Cervical back: Normal range of motion and neck supple.  Skin:    General: Skin is warm and dry.     Comments: Multiple tender nodules on b/l lower legs  Neurological:     Mental Status: She is alert and oriented to person, place, and time.  Psychiatric:  Attention and Perception: Attention and perception normal.        Mood and Affect: Mood normal.        Behavior: Behavior normal.        Thought Content: Thought content normal.        Judgment: Judgment normal.     Assessment and Plan:   1. 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 -We will get blood work, urinalysis and throat culture to make sure we are not missing anything else -Consider compression stockings -If it gets worse, we can add on oral steroids but I would prefer for dermatology to decide this  - CBC with Differential/Platelet - Antistreptolysin O titer - DG Chest 2 View; Future - ANA w/Reflex if Positive - Urinalysis, Routine w reflex microscopic - Comp Met (CMET) - Culture, Group A Strep  2. Hyperlipidemia, unspecified hyperlipidemia type - Lipid panel    I, Isabelle Course, acting as a Neurosurgeon for Jarold Motto, [place]., have documented all relevant documentation on the behalf of Jarold Motto, [place], as directed by  Jarold Motto, PA while in the presence of Jarold Motto, [place].  I, Jarold Motto, [place], have reviewed all documentation for this visit. The documentation on 02/04/24 for the exam, diagnosis, procedures, and orders are all accurate and complete.  I spent a total of 30 minutes on this visit, today 02/04/24, which included reviewing previous notes from Allergy and ENT, ordering tests, discussing plan of care with patient and using shared-decision making on next steps, refilling medications, and  documenting the findings in the note.   Jarold Motto, PA-C

## 2024-02-04 NOTE — Patient Instructions (Addendum)
 It was great to see you!  I've reached out to dermatology to get you in sooner  I would like for you to reach out to your allergist to see if dupixent is involved?  We will get blood work, urinalysis and throat culture to make sure we are not missing anything else  Consider compression stockings  If it gets worse, we can add on oral steroids but I would prefer for dermatology to decide this  Take care,  Jarold Motto PA-C

## 2024-02-05 LAB — ANTISTREPTOLYSIN O TITER: ASO: 76 [IU]/mL (ref <200)

## 2024-02-05 LAB — ANA W/REFLEX IF POSITIVE: Anti Nuclear Antibody (ANA): NEGATIVE

## 2024-02-06 ENCOUNTER — Encounter: Payer: Self-pay | Admitting: Physician Assistant

## 2024-02-06 LAB — CULTURE, GROUP A STREP
Micro Number: 16209633
SPECIMEN QUALITY:: ADEQUATE

## 2024-02-06 NOTE — Telephone Encounter (Signed)
 Called Radiology and spoke to [place]. She is going to make x-ray STAT for it to be read.

## 2024-02-07 ENCOUNTER — Ambulatory Visit: Admitting: Emergency Medicine

## 2024-02-07 ENCOUNTER — Encounter: Payer: Self-pay | Admitting: Emergency Medicine

## 2024-02-07 VITALS — BP 136/70 | HR 95 | Ht 63.0 in | Wt 152.0 lb

## 2024-02-07 DIAGNOSIS — J309 Allergic rhinitis, unspecified: Secondary | ICD-10-CM | POA: Insufficient documentation

## 2024-02-07 DIAGNOSIS — R053 Chronic cough: Secondary | ICD-10-CM

## 2024-02-07 DIAGNOSIS — K219 Gastro-esophageal reflux disease without esophagitis: Secondary | ICD-10-CM | POA: Diagnosis not present

## 2024-02-07 DIAGNOSIS — J301 Allergic rhinitis due to pollen: Secondary | ICD-10-CM

## 2024-02-07 NOTE — Progress Notes (Signed)
 Subjective:    Patient ID: Lindsey Knight, female    DOB: 1961-10-22, 63 y.o.   MRN: [id number]  HPI Patient is 63, never smoker who has been followed by Dr. Francine Graven in our office for asthma and significant allergic rhinitis, chronic rhinitis.  Last seen in June 2024.  She is also been followed by allergy and ENT with evidence for pansinusitis, with sgy done in 12/24. Still following w ENT for edema, swelling. Has been treated for possible chronic sinusitis. Does zyrtec, singulair, saline/budesonide,gent. Still w a lot of cough, nasal gtt that she has to clear in the am. This seems to be her biggest problem.  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   She has active reflux and a HH. On pepcid. Careful about her diet but she still feels some reflux at times.  She still feels sinus fullness and is being considered for another sinus sgy at [facility]. She was told by ENT that she needed to be checked for CF?? She is on Advair but not sure if it helps her.   Spirometry 10/22/23 reviewed by me looks normal.    Review of Systems As per HPI  Past Medical History:  Diagnosis Date   Allergy Child   Anemia    Arthritis    Asthma    Eczema    GERD (gastroesophageal reflux disease) 10/22   Hiatal hernia    Osteoporosis    Torn ligament    pt reports torn ligament in right ankle, use caution when turning   Ulcer 6/23   Vaginal delivery    x 2, 1985, 1990     Family History  Problem Relation Age of Onset   Colon cancer Mother    Cancer Mother    Hearing loss Mother    Heart failure Father    Asthma Father    COPD Father    Hearing loss Father    Crohn's disease Sister    Pancreatic cancer Other    Ovarian cancer Other    Uterine cancer Other    Allergic rhinitis Grandson    Food Allergy Grandson    Asthma Daughter      Social History   Socioeconomic History   Marital  status: Married    Spouse name: Not on file   Number of children: Not on file   Years of education: Not on file   Highest education level: Bachelor's degree (e.g., BA, AB, BS)  Occupational History   Not on file  Tobacco Use   Smoking status: Never    Passive exposure: Never   Smokeless tobacco: Never  Vaping Use   Vaping status: Never Used  Substance and Sexual Activity   Alcohol use: Not Currently   Drug use: Not Currently    Types: Marijuana    Comment: THC for sleep   Sexual activity: Not Currently    Partners: Male    Birth control/protection: Post-menopausal    Comment: menarche 63yo, sexual debut 63yo  Other Topics Concern   Not on file  Social History Narrative   Retired Engineer, civil (consulting)   Married   Has two local daughters and grandchildren   Social Drivers of Corporate investment banker Strain: Low Risk  (02/03/2024)   Overall Financial Resource Strain (CARDIA)    Difficulty of Paying Living Expenses: Not hard at all  Food Insecurity:  No Food Insecurity (02/03/2024)   Hunger Vital Sign    Worried About Running Out of Food in the Last Year: Never true    Ran Out of Food in the Last Year: Never true  Transportation Needs: No Transportation Needs (02/03/2024)   PRAPARE - Administrator, Civil Service (Medical): No    Lack of Transportation (Non-Medical): No  Physical Activity: Insufficiently Active (02/03/2024)   Exercise Vital Sign    Days of Exercise per Week: 3 days    Minutes of Exercise per Session: 40 min  Stress: No Stress Concern Present (02/03/2024)   [employer] of Occupational Health - Occupational Stress Questionnaire    Feeling of Stress : Only a little  Social Connections: Moderately Integrated (02/03/2024)   Social Connection and Isolation Panel [NHANES]    Frequency of Communication with Friends and Family: More than three times a week    Frequency of Social Gatherings with Friends and Family: Twice a week    Attends Religious Services: Never     Database administrator or Organizations: Yes    Attends Engineer, structural: More than 4 times per year    Marital Status: Married  Catering manager Violence: Not on file     Allergies  Allergen Reactions   Other Hives    Adhesives   Tape Rash     Outpatient Medications Prior to Visit  Medication Sig Dispense Refill   albuterol (VENTOLIN HFA) 108 (90 Base) MCG/ACT inhaler Inhale 2 puffs into the lungs every 4 (four) hours as needed for wheezing or shortness of breath (coughing fits). 18 g 1   alendronate (FOSAMAX) 70 MG tablet Take 1 tablet (70 mg total) by mouth every 7 (seven) days. Take with a full glass of water on an empty stomach. 12 tablet 4   Ascorbic Acid (VITAMIN C CR) 500 MG TBCR Take 1 tablet by mouth every other day.     benzonatate (TESSALON) 100 MG capsule Take 1 capsule (100 mg total) by mouth 3 (three) times daily as needed. 30 capsule 1   budesonide (PULMICORT) 0.25 MG/2ML nebulizer solution Take 0.25 mg by nebulization 2 (two) times daily. Nasal rinse     Cetirizine HCl (ZYRTEC PO) Take by mouth.     Cholecalciferol (VITAMIN D3 ADULT GUMMIES PO)      Cyanocobalamin (VITAMIN B-12 PO) Take 1,000 mcg by mouth daily in the afternoon.     estradiol (ESTRACE VAGINAL) 0.1 MG/GM vaginal cream Place 1 g vaginally 3 (three) times a week. 42.5 g 12   famotidine (PEPCID) 20 MG tablet Take 20 mg by mouth daily.     ferrous sulfate 324 MG TBEC Take 324 mg by mouth every other day.     fluticasone-salmeterol (ADVAIR) 250-50 MCG/ACT AEPB Inhale 1 puff into the lungs in the morning and at bedtime. Rinse mouth after each use. 60 each 5   MAGNESIUM GLYCINATE PO      Melatonin 5 MG CHEW Chew by mouth.     montelukast (SINGULAIR) 10 MG tablet TAKE 1 TABLET BY MOUTH EVERY NIGHT AT BEDTIME 90 tablet 3   Multiple Vitamins-Minerals (MULTIVITAMIN ADULT) CHEW      traMADol (ULTRAM) 50 MG tablet Take 1 tablet (50 mg total) by mouth every 6 (six) hours as needed for moderate pain  (pain score 4-6) or severe pain (pain score 7-10) (headache). 30 tablet 0   traZODone (DESYREL) 100 MG tablet TAKE 1 TABLET BY MOUTH EVERY NIGHT AT BEDTIME 90  tablet 0   dupilumab (DUPIXENT) 300 MG/2ML prefilled syringe Inject 300 mg into the skin every 14 (fourteen) days. (Patient not taking: Reported on 02/07/2024) 4 mL 11   No facility-administered medications prior to visit.        Objective:   Physical Exam Vitals:   02/07/24 0943  BP: 136/70  Pulse: 95  SpO2: 99%  Weight: 152 lb (68.9 kg)  Height: 5\' 3"  (1.6 m)   Gen: Pleasant, well-nourished, in no distress,  normal affect  ENT: No lesions,  mouth clear,  oropharynx clear, no postnasal drip  Neck: No JVD, no stridor  Lungs: No use of accessory muscles, no crackles or wheezing on normal respiration, no wheeze on forced expiration  Cardiovascular: RRR, heart sounds normal, no murmur or gallops, no peripheral edema  Musculoskeletal: No deformities, no cyanosis or clubbing  Neuro: alert, awake, non focal  Skin: Warm, no lesions or rash       Assessment & Plan:   Chronic cough With suspected asthma but her spirometry from 10/2023 was normal.  Do not see any other pulmonary function testing so we will order this now.  She has not felt much improvement with Advair so asked her to stop it to see if discontinuation of the powdered inhaler would help with cough.  She still has albuterol available.  Will work to try to control her contributing factors which include allergic rhinitis and GERD.  It sounds like she had gotten some benefit from Dupixent but now she is going to have to come off of it due to lower extremity nodular disease, question erythema nodosum.         Allergic rhinitis She was started on Dupixent, now being discontinued.  Followed by Dr. Selena Batten.  She remains on Singulair and Zyrtec, nasal saline rinses.  GERD (gastroesophageal reflux disease) Potential contributor to her cough and also to labile asthma  symptoms (if she does have obstruction).  She is on Pepcid but this may need to be escalated.  Time spent 41 minutes  Levy Pupa, MD, PhD 02/07/2024, 5:03 PM [facility] [phone] or if no answer before 7:00PM call [phone] For any issues after 7:00PM please call eLink [phone]

## 2024-02-07 NOTE — Assessment & Plan Note (Signed)
 She was started on Dupixent, now being discontinued.  Followed by Dr. Selena Batten.  She remains on Singulair and Zyrtec, nasal saline rinses.

## 2024-02-07 NOTE — Patient Instructions (Signed)
 Try stopping Advair to see if this impacts your breathing or your coughing. Okay to keep your albuterol available to use 2 puffs when needed for shortness of breath, chest tightness, wheezing. We will perform pulmonary function testing Agree with coming off Dupixent Continue your Singulair, Zyrtec, nasal rinses as you have been doing them. Agree with follow-up with ENT Continue your Pepcid.  It is possible that if you are still having breakthrough reflux that this may be contributing to your chronic cough.  We may decide to increase your reflux medication at some point going forward. Follow-up with Dr. Francine Graven after your pulmonary function testing to review results and to see how you are doing off the Advair.

## 2024-02-07 NOTE — Assessment & Plan Note (Signed)
 Potential contributor to her cough and also to labile asthma symptoms (if she does have obstruction).  She is on Pepcid but this may need to be escalated.

## 2024-02-07 NOTE — Assessment & Plan Note (Signed)
 With suspected asthma but her spirometry from 10/2023 was normal.  Do not see any other pulmonary function testing so we will order this now.  She has not felt much improvement with Advair so asked her to stop it to see if discontinuation of the powdered inhaler would help with cough.  She still has albuterol available.  Will work to try to control her contributing factors which include allergic rhinitis and GERD.  It sounds like she had gotten some benefit from Dupixent but now she is going to have to come off of it due to lower extremity nodular disease, question erythema nodosum.

## 2024-02-07 NOTE — Progress Notes (Signed)
 Follow Up Note  RE: Lindsey Knight MRN: [id number] DOB: [birth year] Date of Office Visit: 02/08/2024  Referring provider: Jarold Motto, PA Primary care provider: Jarold Motto, [place]  Chief Complaint: Medication Reaction (Possible reaction to dupixent.)  History of Present Illness: I had the pleasure of seeing Lindsey Knight for a follow up visit at the [facility] on 02/09/2024. She is a 63 y.o. female, who is being followed for allergic rhinitis, nasal polyps on Dupixent, recurrent sinusitis, asthma, headaches. Her previous allergy office visit was on 12/24/2023 with Dr. Selena Batten. Today is a new complaint visit of possible Dupixent reactions .  Discussed the use of AI scribe software for clinical note transcription with the patient, who gave verbal consent to proceed.    She has been experiencing severe headaches since January, described as the worst pain she has felt. The headaches require frequent use of ibuprofen and Tylenol every two to three hours, and she was also prescribed tramadol. A CT scan revealed significant sinus swelling, but the cause remains unclear. She underwent sinus surgery in December, and a follow-up CT in January showed no improvement. She has been using a sinus rinse with saline and budesonide, and later added gentamicin after cultures showed staph. No recent oral antibiotic use.  In addition to headaches, she noticed unusual bruising on her legs starting at the end of January. The bruises are described as deep and appear as bumps, particularly painful after long walks or when elevating her feet. No activities that would typically cause bruising.  Her last Dupixent injection was last week and it may have flared her symptoms.  She has a persistent cough that exacerbates her headaches, sometimes leading to vomiting. She has been using Robitussin, cough drops, and Tessalon Perles to manage the cough. She stopped using Advair due to the cough. The cough is  described as postnasal, with no significant nasal drainage except during sinus rinses. Patient just saw pulmonology for this.   She has undergone extensive blood work, including allergy tests, which returned negative. Her total IgE level was notably high in the past. Despite high IgE levels, allergy tests have not shown significant results.   01/23/2024 ENT visit: "1. Chronic pansinusitis  2. Purulent post-nasal discharge  3. Hilar lymphadenopathy   Obtain outside imaging for personal review Could consider mega-antrostomies to help with irrigation / sinus outflow; explained that this would be highly unlikely to cure the underlying inflammatory condition Check labs to screen for systemic contributors (immunodeficiency, parasite, allergy, autoimmune) Continue saline irrigation; unclear that topical medications are adding benefit but she can assess for this when she tapers off them Provided diflucan to try if hoarseness continues bc she is on a daily ICS / ?candida We will discuss results and imaging by phone and further treatment options "    Assessment and Plan: Lindsey Knight is a 63 y.o. 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 Patient is not sure of exact timeline as she had severe headaches/migraines which she was focused on at that time. She started Dupixent on 09/13/2023 and last injection was last week which now thinks flared these bumps on her legs. She had extensive bloodwork drawn by ENT on 01/23/2024 Stop Dupixent injections given concerns about possibly contributing to this rash/bumps on the legs.  Document affected areas with photographs.  Seasonal allergic rhinitis due to pollen Allergic rhinitis due to mold Nose polyp PND  Past history - sinus surgery in 2013. Currently on Zyrtec, Singulair, Atrovent, and budesonide nasal rinses. 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 No worsening symptoms after taking NSAIDs. Elevated IgE level. 2024 skin testing positive to weed and mold. Started Dupixent on 09/13/2023. Sinus surgery on 10/24/2023.  Interim history - saw a different ENT who ordered extensive work up.  Continue recommendations as per your ENT.  Use Atrovent (ipratropium) 0.03% 1-2 sprays per nostril twice a day as needed for runny nose/drainage.  Recurrent sinusitis 2025 labs normal immunoglobulin levels, tetanus, diptheria titers protective, borderline pneumococcal titers.  Keep track of infections and antibiotics use.  Moderate persistent asthma without complication Past history - managed by pulmonologist with Advair and Albuterol as needed. Asthma exacerbations requiring prednisone courses three times this year. Interim history - stopped Advair recently per pulmonology. Monitor symptoms.  Elevated IgE level Will recheck.   Return in about 2 months (around 04/09/2024).  No orders of the defined types were placed in this encounter.  Lab Orders         IgE         Tryptase      Diagnostics: None.   Medication List:  Current Outpatient Medications  Medication Sig Dispense Refill   albuterol (VENTOLIN HFA) 108 (90 Base) MCG/ACT inhaler Inhale 2 puffs into the lungs every 4 (four) hours as needed for wheezing or shortness of breath (coughing fits). 18 g 1   alendronate (FOSAMAX) 70 MG tablet Take 1 tablet (70 mg total) by mouth every 7 (seven) days. Take with a full glass of water on an empty stomach. 12 tablet 4   Ascorbic Acid (VITAMIN C CR) 500 MG TBCR Take 1 tablet by mouth every other day.     benzonatate (TESSALON) 100 MG capsule Take 1 capsule (100 mg total) by mouth 3 (three) times daily as needed. 30 capsule 1   budesonide (PULMICORT) 0.25 MG/2ML nebulizer solution Take 0.25 mg by nebulization 2 (two) times daily. Nasal rinse     Cetirizine HCl (ZYRTEC PO) Take by mouth.      Cholecalciferol (VITAMIN D3 ADULT GUMMIES PO)      Cyanocobalamin (VITAMIN B-12 PO) Take 1,000 mcg by mouth daily in the afternoon.     estradiol (ESTRACE VAGINAL) 0.1 MG/GM vaginal cream Place 1 g vaginally 3 (three) times a week. 42.5 g 12   famotidine (PEPCID) 20 MG tablet Take 20 mg by mouth daily.     ferrous sulfate 324 MG TBEC Take 324 mg by mouth every other day.     MAGNESIUM GLYCINATE PO      Melatonin 5 MG CHEW Chew by mouth.     montelukast (SINGULAIR) 10 MG tablet TAKE 1 TABLET BY MOUTH EVERY NIGHT AT BEDTIME 90 tablet 3   Multiple Vitamins-Minerals (MULTIVITAMIN ADULT) CHEW      traMADol (ULTRAM) 50 MG tablet Take 1 tablet (50 mg total) by mouth every 6 (six) hours as needed for moderate pain (pain score 4-6) or severe pain (pain score 7-10) (headache). 30 tablet 0   traZODone (DESYREL) 100 MG tablet TAKE 1 TABLET BY MOUTH EVERY NIGHT AT BEDTIME 90 tablet 0   No current facility-administered medications for this visit.   Allergies: Allergies  Allergen Reactions   Other Hives    Adhesives   Dupixent [Dupilumab]     Erythema nodosum?   Tape Rash   I reviewed her past medical history, social history, family history, and environmental history and no  significant changes have been reported from her previous visit.  Review of Systems  Constitutional:  Negative for appetite change, chills, fever and unexpected weight change.  HENT:  Positive for postnasal drip, sinus pressure and sinus pain. Negative for rhinorrhea.   Eyes:  Negative for itching.  Respiratory:  Positive for cough. Negative for chest tightness, shortness of breath and wheezing.   Cardiovascular:  Negative for chest pain.  Gastrointestinal:  Negative for abdominal pain.  Genitourinary:  Negative for difficulty urinating.  Skin:  Positive for rash.  Allergic/Immunologic: Positive for environmental allergies.  Neurological:  Positive for headaches.    Objective: BP 138/70 (BP Location: Right Arm, Patient  Position: Sitting, Cuff Size: Normal)   Pulse 88   Temp 98.6 F (37 C) (Temporal)   Resp 16   SpO2 98%  There is no height or weight on file to calculate BMI. Physical Exam Vitals and nursing note reviewed.  Constitutional:      Appearance: Normal appearance. She is well-developed.  HENT:     Head: Normocephalic and atraumatic.     Right Ear: Tympanic membrane and external ear normal.     Left Ear: Tympanic membrane and external ear normal.     Nose: No rhinorrhea.     Mouth/Throat:     Mouth: Mucous membranes are moist.     Pharynx: Oropharynx is clear.  Eyes:     Conjunctiva/sclera: Conjunctivae normal.  Cardiovascular:     Rate and Rhythm: Normal rate and regular rhythm.     Heart sounds: Normal heart sounds. No murmur heard.    No friction rub. No gallop.  Pulmonary:     Effort: Pulmonary effort is normal.     Breath sounds: Normal breath sounds. No wheezing, rhonchi or rales.  Musculoskeletal:     Cervical back: Neck supple.  Skin:    General: Skin is warm.     Findings: Rash present.     Comments: A few scattered circular discoloration on lower extremities. Some palpable "nodules" on calf area b/l.   Neurological:     Mental Status: She is alert and oriented to person, place, and time.  Psychiatric:        Behavior: Behavior normal.   Previous notes and tests were reviewed. The plan was reviewed with the patient/family, and all questions/concerned were addressed.  It was my pleasure to see Lindsey Knight today and participate in her care. Please feel free to contact me with any questions or concerns.  Sincerely,  Wyline Mood, DO Allergy & Immunology  [facility] office: [phone] [facility] office: [phone]

## 2024-02-08 ENCOUNTER — Encounter: Payer: Self-pay | Admitting: Allergy

## 2024-02-08 ENCOUNTER — Other Ambulatory Visit: Payer: Self-pay

## 2024-02-08 ENCOUNTER — Ambulatory Visit: Admitting: Allergy

## 2024-02-08 VITALS — BP 138/70 | HR 88 | Temp 98.6°F | Resp 16

## 2024-02-08 DIAGNOSIS — J339 Nasal polyp, unspecified: Secondary | ICD-10-CM | POA: Diagnosis not present

## 2024-02-08 DIAGNOSIS — L52 Erythema nodosum: Secondary | ICD-10-CM

## 2024-02-08 DIAGNOSIS — R768 Other specified abnormal immunological findings in serum: Secondary | ICD-10-CM

## 2024-02-08 DIAGNOSIS — Z888 Allergy status to other drugs, medicaments and biological substances status: Secondary | ICD-10-CM

## 2024-02-08 DIAGNOSIS — J3089 Other allergic rhinitis: Secondary | ICD-10-CM

## 2024-02-08 DIAGNOSIS — J329 Chronic sinusitis, unspecified: Secondary | ICD-10-CM

## 2024-02-08 DIAGNOSIS — J301 Allergic rhinitis due to pollen: Secondary | ICD-10-CM | POA: Diagnosis not present

## 2024-02-08 DIAGNOSIS — R0982 Postnasal drip: Secondary | ICD-10-CM

## 2024-02-08 DIAGNOSIS — T7840XA Allergy, unspecified, initial encounter: Secondary | ICD-10-CM | POA: Diagnosis not present

## 2024-02-08 DIAGNOSIS — J4540 Moderate persistent asthma, uncomplicated: Secondary | ICD-10-CM

## 2024-02-08 DIAGNOSIS — T50995D Adverse effect of other drugs, medicaments and biological substances, subsequent encounter: Secondary | ICD-10-CM

## 2024-02-08 NOTE — Patient Instructions (Addendum)
 Rash Stop Dupixent injections. Monitor symptoms Take pictures. Get bloodwork We are ordering labs, so please allow 1-2 weeks for the results to come back. With the newly implemented Cures Act, the labs might be visible to you at the same time that they become visible to me. However, I will not address the results until all of the results are back, so please be patient.  In the meantime, continue recommendations in your patient instructions, including avoidance measures (if applicable), until you hear from me.  Post nasal drip Use Atrovent (ipratropium) 0.03% 1-2 sprays per nostril twice a day as needed for runny nose/drainage.  Sinuses Continue recommendations as per your ENT.   Follow up in 2 months or sooner if needed.

## 2024-02-09 ENCOUNTER — Encounter: Payer: Self-pay | Admitting: Allergy

## 2024-02-11 ENCOUNTER — Other Ambulatory Visit (INDEPENDENT_AMBULATORY_CARE_PROVIDER_SITE_OTHER)

## 2024-02-11 DIAGNOSIS — D509 Iron deficiency anemia, unspecified: Secondary | ICD-10-CM | POA: Diagnosis not present

## 2024-02-11 DIAGNOSIS — K50919 Crohn's disease, unspecified, with unspecified complications: Secondary | ICD-10-CM

## 2024-02-11 LAB — IBC + FERRITIN
Ferritin: 112.7 ng/mL (ref 10.0–291.0)
Iron: 53 ug/dL (ref 42–145)
Saturation Ratios: 21.5 % (ref 20.0–50.0)
TIBC: 246.4 ug/dL — ABNORMAL LOW (ref 250.0–450.0)
Transferrin: 176 mg/dL — ABNORMAL LOW (ref 212.0–360.0)

## 2024-02-16 LAB — TRYPTASE: Tryptase: 1.1 ug/L — ABNORMAL LOW (ref 2.2–13.2)

## 2024-02-16 LAB — IGE: IgE (Immunoglobulin E), Serum: 120 [IU]/mL (ref 6–495)

## 2024-02-18 ENCOUNTER — Encounter: Payer: Self-pay | Admitting: Physician Assistant

## 2024-02-18 ENCOUNTER — Ambulatory Visit: Admitting: Physician Assistant

## 2024-02-18 VITALS — BP 130/80 | HR 93 | Temp 97.9°F | Ht 63.0 in | Wt 149.0 lb

## 2024-02-18 DIAGNOSIS — R591 Generalized enlarged lymph nodes: Secondary | ICD-10-CM | POA: Diagnosis not present

## 2024-02-18 DIAGNOSIS — R051 Acute cough: Secondary | ICD-10-CM

## 2024-02-18 DIAGNOSIS — R7309 Other abnormal glucose: Secondary | ICD-10-CM | POA: Diagnosis not present

## 2024-02-18 DIAGNOSIS — R350 Frequency of micturition: Secondary | ICD-10-CM

## 2024-02-18 LAB — URINALYSIS, ROUTINE W REFLEX MICROSCOPIC
Bilirubin Urine: NEGATIVE
Hgb urine dipstick: NEGATIVE
Ketones, ur: NEGATIVE
Leukocytes,Ua: NEGATIVE
Nitrite: NEGATIVE
RBC / HPF: NONE SEEN (ref 0–?)
Specific Gravity, Urine: <=1.005 — AB (ref 1.000–1.030)
Total Protein, Urine: NEGATIVE
Urine Glucose: NEGATIVE
Urobilinogen, UA: 0.2 (ref 0.0–1.0)
pH: 6 (ref 5.0–8.0)

## 2024-02-18 LAB — HEMOGLOBIN A1C: Hgb A1c MFr Bld: 5.8 % (ref 4.6–6.5)

## 2024-02-18 MED ORDER — LEVOFLOXACIN 500 MG PO TABS: 500.0000 mg | ORAL_TABLET | Freq: Every day | ORAL | 0 refills | Status: AC

## 2024-02-18 MED ORDER — BENZONATATE 100 MG PO CAPS: 100.0000 mg | ORAL_CAPSULE | Freq: Three times a day (TID) | ORAL | 2 refills | Status: AC | PRN

## 2024-02-18 NOTE — Progress Notes (Signed)
 Lindsey Knight is a 63 y.o. female here for a new problem.  History of Present Illness:   Chief Complaint  Patient presents with   Cough    Pt c/o cough x 1 month, post nasal drip, head congestion.    HPI  Cough Pt complains of chronic dry cough, post nasal drip, and congestion starting about 1 month ago.  Recently followed up with Dr. Delton Coombes of pulmonology on 3/20, per pt it was recommended she discontinue Advair.  She also followed up with Dr. Selena Batten, who suggested she start Atrovent spray  She is scheduled to have PFTs later this month and follow up with Dr. Francine Graven on 03/26/2024.  She has tried taking Robitussin and Riccola cough drips with no improvements.  Has also tried tessalon perles which helped. She is out of these. She has stopped taking Dupixent.  Denies any ear pain, crackling, or popping. Her allergist has recommended that she reduce her use of steroids as she has used them frequently over the past several months   Abnormal urinalysis On our visit earlier this month, we completed a urinalysis  It showed trace leukocytes and white blood cell(s)  She endorses urinary frequency Denies burning or pressure   Swollen lymph node She has a slightly swollen supraclavicular lymph node found on exam today We actually performed an ultrasound in Sep 2023 for left cervical lymph node  IMPRESSION: The area of patient reported palpable abnormality in the left neck correlates with a benign-appearing lymph node. Recommend clinical follow up to resolution.      Past Medical History:  Diagnosis Date   Allergy Child   Anemia    Arthritis    Asthma    Eczema    GERD (gastroesophageal reflux disease) 10/22   Hiatal hernia    Osteoporosis    Torn ligament    pt reports torn ligament in right ankle, use caution when turning   Ulcer 6/23   Vaginal delivery    x 2, 1985, 1990     Social History   Tobacco Use   Smoking status: Never    Passive exposure: Never   Smokeless  tobacco: Never  Vaping Use   Vaping status: Never Used  Substance Use Topics   Alcohol use: Not Currently   Drug use: Not Currently    Types: Marijuana    Comment: THC for sleep    Past Surgical History:  Procedure Laterality Date   COLONOSCOPY  01/23/2022   normal - 2012   LIPOMA EXCISION Right 03/27/2023   back   NASAL SEPTOPLASTY W/ TURBINOPLASTY Bilateral 10/24/2023   Procedure: Septoplasty/inferior turbinate reduction AND BALLOON SINUSPLASTY;  Surgeon: Ashok Croon, MD;  Location: [facility];  Service: ENT;  Laterality: Bilateral;   NASAL SINUS SURGERY Bilateral 2015   OTHER SURGICAL HISTORY     lipoma removed from back 03/2023   SINUS ENDO W/FUSION Bilateral 10/24/2023   Procedure: Functional Endoscopic Sinus Surgery With Navigation;  Surgeon: Ashok Croon, MD;  Location: [facility];  Service: ENT;  Laterality: Bilateral;    Family History  Problem Relation Age of Onset   Colon cancer Mother    Cancer Mother    Hearing loss Mother    Heart failure Father    Asthma Father    COPD Father    Hearing loss Father    Crohn's disease Sister    Pancreatic cancer Other    Ovarian cancer Other    Uterine cancer Other  Allergic rhinitis Grandson    Food Allergy Grandson    Asthma Daughter     Allergies  Allergen Reactions   Other Hives    Adhesives   Dupixent [Dupilumab]     Erythema nodosum?   Tape Rash    Current Medications:   Current Outpatient Medications:    alendronate (FOSAMAX) 70 MG tablet, Take 1 tablet (70 mg total) by mouth every 7 (seven) days. Take with a full glass of water on an empty stomach., Disp: 12 tablet, Rfl: 4   budesonide (PULMICORT) 0.25 MG/2ML nebulizer solution, Take 0.25 mg by nebulization 2 (two) times daily. Nasal rinse, Disp: , Rfl:    Cetirizine HCl (ZYRTEC PO), Take by mouth., Disp: , Rfl:    Cholecalciferol (VITAMIN D3 ADULT GUMMIES PO), , Disp: , Rfl:    Cyanocobalamin (VITAMIN B-12 PO),  Take 1,000 mcg by mouth daily in the afternoon., Disp: , Rfl:    estradiol (ESTRACE VAGINAL) 0.1 MG/GM vaginal cream, Place 1 g vaginally 3 (three) times a week., Disp: 42.5 g, Rfl: 12   famotidine (PEPCID) 20 MG tablet, Take 20 mg by mouth daily., Disp: , Rfl:    levofloxacin (LEVAQUIN) 500 MG tablet, Take 1 tablet (500 mg total) by mouth daily for 7 days., Disp: 7 tablet, Rfl: 0   MAGNESIUM GLYCINATE PO, , Disp: , Rfl:    Melatonin 5 MG CHEW, Chew by mouth., Disp: , Rfl:    montelukast (SINGULAIR) 10 MG tablet, TAKE 1 TABLET BY MOUTH EVERY NIGHT AT BEDTIME, Disp: 90 tablet, Rfl: 3   Multiple Vitamins-Minerals (MULTIVITAMIN ADULT) CHEW, , Disp: , Rfl:    traMADol (ULTRAM) 50 MG tablet, Take 1 tablet (50 mg total) by mouth every 6 (six) hours as needed for moderate pain (pain score 4-6) or severe pain (pain score 7-10) (headache)., Disp: 30 tablet, Rfl: 0   traZODone (DESYREL) 100 MG tablet, TAKE 1 TABLET BY MOUTH EVERY NIGHT AT BEDTIME, Disp: 90 tablet, Rfl: 0   albuterol (VENTOLIN HFA) 108 (90 Base) MCG/ACT inhaler, Inhale 2 puffs into the lungs every 4 (four) hours as needed for wheezing or shortness of breath (coughing fits). (Patient not taking: Reported on 02/18/2024), Disp: 18 g, Rfl: 1   Ascorbic Acid (VITAMIN C CR) 500 MG TBCR, Take 1 tablet by mouth every other day. (Patient not taking: Reported on 02/18/2024), Disp: , Rfl:    benzonatate (TESSALON) 100 MG capsule, Take 1 capsule (100 mg total) by mouth 3 (three) times daily as needed., Disp: 30 capsule, Rfl: 2   Review of Systems:   Negative unless otherwise specified per HPI.  Vitals:   Vitals:   02/18/24 1029  BP: 130/80  Pulse: 93  Temp: 97.9 F (36.6 C)  TempSrc: Temporal  SpO2: 96%  Weight: 149 lb (67.6 kg)  Height: 5\' 3"  (1.6 m)     Body mass index is 26.39 kg/m.  Physical Exam:   Physical Exam Vitals and nursing note reviewed.  Constitutional:      General: She is not in acute distress.    Appearance: She is  well-developed. She is not ill-appearing or toxic-appearing.  Cardiovascular:     Rate and Rhythm: Normal rate and regular rhythm.     Pulses: Normal pulses.     Heart sounds: Normal heart sounds, S1 normal and S2 normal.  Pulmonary:     Effort: Pulmonary effort is normal.     Breath sounds: Normal breath sounds.  Lymphadenopathy:     Upper Body:  Left upper body: Supraclavicular adenopathy present.  Skin:    General: Skin is warm and dry.  Neurological:     Mental Status: She is alert.     GCS: GCS eye subscore is 4. GCS verbal subscore is 5. GCS motor subscore is 6.  Psychiatric:        Speech: Speech normal.        Behavior: Behavior normal. Behavior is cooperative.     Assessment and Plan:   1. Acute cough (Primary) No red flags on exam.   Will initiate levaquin per orders, as she states that she typically does not respond to azithromycin Refill tessalon Perles for as needed use Consider replacing albuterol for Airsupra -- I gave her a coupon and she would like to consider this in the meantime -- she will reach out if she would like to start this Discussed taking medications as prescribed.  Reviewed return precautions including new or worsening fever, SOB, new or worsening cough or other concerns.  Push fluids and rest.  I recommend that patient follow-up if symptoms worsen or persist despite treatment x 7-10 days, sooner if needed.  2. Urinary frequency Will recheck urinalysis with urine culture this time Treatment will be based on results and clinical symptom(s)  - Urinalysis, Routine w reflex microscopic - Urine Culture  3. Elevated glucose Update Hemoglobin A1c and provide recommendations  - HgB A1c  4. Lymphadenopathy Offered imaging today vs watchful waiting Will continue to monitor If area enlarges or does not improve in a few weeks, I have asked her to reach out and let me know so I can order imaging Alternatively, she may discuss with her ENT, pulmonary  or surgical provider  I, Isabelle Course, acting as a scribe for Jarold Motto, [place]., have documented all relevant documentation on the behalf of Jarold Motto, [place], as directed by  Jarold Motto, PA while in the presence of Jarold Motto, [place].  I, Jarold Motto, [place], have reviewed all documentation for this visit. The documentation on 02/18/24 for the exam, diagnosis, procedures, and orders are all accurate and complete.  Jarold Motto, PA-C

## 2024-02-19 ENCOUNTER — Encounter: Payer: Self-pay | Admitting: Physician Assistant

## 2024-02-19 ENCOUNTER — Other Ambulatory Visit (HOSPITAL_COMMUNITY): Payer: Self-pay

## 2024-02-19 ENCOUNTER — Encounter: Payer: Self-pay | Admitting: Allergy

## 2024-02-19 DIAGNOSIS — E88819 Insulin resistance, unspecified: Secondary | ICD-10-CM | POA: Insufficient documentation

## 2024-02-19 LAB — URINE CULTURE
MICRO NUMBER:: 16268120
Result:: NO GROWTH
SPECIMEN QUALITY:: ADEQUATE

## 2024-02-19 NOTE — Progress Notes (Signed)
 Disenrolled patient from Transport planner. Per OV on 02/08/24, patient informed MDO of possible rash/bumps on legs due to injections. MD has advised her to stop Dupixent.

## 2024-02-20 ENCOUNTER — Ambulatory Visit: Payer: BC Managed Care – PPO | Admitting: Allergy

## 2024-02-24 ENCOUNTER — Other Ambulatory Visit: Payer: Self-pay | Admitting: Physician Assistant

## 2024-03-03 ENCOUNTER — Ambulatory Visit (HOSPITAL_BASED_OUTPATIENT_CLINIC_OR_DEPARTMENT_OTHER): Admitting: Pulmonary Disease

## 2024-03-03 DIAGNOSIS — R053 Chronic cough: Secondary | ICD-10-CM

## 2024-03-03 LAB — PULMONARY FUNCTION TEST
DL/VA % pred: 98 %
DL/VA: 4.17 ml/min/mmHg/L
DLCO cor % pred: 98 %
DLCO cor: 19.06 ml/min/mmHg
DLCO unc % pred: 98 %
DLCO unc: 19 ml/min/mmHg
FEF 25-75 Post: 2.72 L/s
FEF 25-75 Pre: 1.59 L/s
FEF2575-%Change-Post: 70 %
FEF2575-%Pred-Post: 125 %
FEF2575-%Pred-Pre: 73 %
FEV1-%Change-Post: 13 %
FEV1-%Pred-Post: 100 %
FEV1-%Pred-Pre: 89 %
FEV1-Post: 2.4 L
FEV1-Pre: 2.12 L
FEV1FVC-%Change-Post: 8 %
FEV1FVC-%Pred-Pre: 96 %
FEV6-%Change-Post: 4 %
FEV6-%Pred-Post: 98 %
FEV6-%Pred-Pre: 94 %
FEV6-Post: 2.94 L
FEV6-Pre: 2.83 L
FEV6FVC-%Pred-Post: 103 %
FEV6FVC-%Pred-Pre: 103 %
FVC-%Change-Post: 4 %
FVC-%Pred-Post: 94 %
FVC-%Pred-Pre: 91 %
FVC-Post: 2.94 L
FVC-Pre: 2.83 L
Post FEV1/FVC ratio: 82 %
Post FEV6/FVC ratio: 100 %
Pre FEV1/FVC ratio: 75 %
Pre FEV6/FVC Ratio: 100 %
RV % pred: 85 %
RV: 1.72 L
TLC % pred: 94 %
TLC: 4.64 L

## 2024-03-03 NOTE — Progress Notes (Signed)
 Full PFT Performed Today

## 2024-03-03 NOTE — Patient Instructions (Signed)
 Full PFT Performed Today

## 2024-03-21 ENCOUNTER — Encounter: Payer: Self-pay | Admitting: Allergy

## 2024-03-24 DIAGNOSIS — R590 Localized enlarged lymph nodes: Secondary | ICD-10-CM | POA: Diagnosis not present

## 2024-03-24 DIAGNOSIS — J329 Chronic sinusitis, unspecified: Secondary | ICD-10-CM | POA: Diagnosis not present

## 2024-03-24 DIAGNOSIS — J324 Chronic pansinusitis: Secondary | ICD-10-CM | POA: Diagnosis not present

## 2024-03-24 DIAGNOSIS — B4481 Allergic bronchopulmonary aspergillosis: Secondary | ICD-10-CM | POA: Diagnosis not present

## 2024-03-25 MED ORDER — CLOBETASOL PROPIONATE 0.05 % EX OINT
1.0000 | TOPICAL_OINTMENT | Freq: Two times a day (BID) | CUTANEOUS | 0 refills | Status: DC
Start: 2024-03-25 — End: 2024-08-21

## 2024-03-25 NOTE — Addendum Note (Signed)
 Addended by: Rochester Chuck on: 03/25/2024 05:04 PM   Modules accepted: Orders

## 2024-03-26 ENCOUNTER — Encounter: Payer: Self-pay | Admitting: Pulmonary Disease

## 2024-03-26 ENCOUNTER — Ambulatory Visit: Admitting: Pulmonary Disease

## 2024-03-26 VITALS — BP 115/75 | HR 88 | Ht 63.0 in | Wt 142.0 lb

## 2024-03-26 DIAGNOSIS — R053 Chronic cough: Secondary | ICD-10-CM

## 2024-03-26 DIAGNOSIS — R21 Rash and other nonspecific skin eruption: Secondary | ICD-10-CM

## 2024-03-26 DIAGNOSIS — R590 Localized enlarged lymph nodes: Secondary | ICD-10-CM

## 2024-03-26 DIAGNOSIS — J4520 Mild intermittent asthma, uncomplicated: Secondary | ICD-10-CM | POA: Diagnosis not present

## 2024-03-26 DIAGNOSIS — J324 Chronic pansinusitis: Secondary | ICD-10-CM

## 2024-03-26 LAB — CBC WITH DIFFERENTIAL/PLATELET
Basophils Absolute: 0 10*3/uL (ref 0.0–0.1)
Basophils Relative: 0.5 % (ref 0.0–3.0)
Eosinophils Absolute: 0.1 10*3/uL (ref 0.0–0.7)
Eosinophils Relative: 2 % (ref 0.0–5.0)
HCT: 40.8 % (ref 36.0–46.0)
Hemoglobin: 14 g/dL (ref 12.0–15.0)
Lymphocytes Relative: 36.7 % (ref 12.0–46.0)
Lymphs Abs: 2.7 10*3/uL (ref 0.7–4.0)
MCHC: 34.3 g/dL (ref 30.0–36.0)
MCV: 87.7 fl (ref 78.0–100.0)
Monocytes Absolute: 0.6 10*3/uL (ref 0.1–1.0)
Monocytes Relative: 7.7 % (ref 3.0–12.0)
Neutro Abs: 3.9 10*3/uL (ref 1.4–7.7)
Neutrophils Relative %: 53.1 % (ref 43.0–77.0)
Platelets: 383 10*3/uL (ref 150.0–400.0)
RBC: 4.65 Mil/uL (ref 3.87–5.11)
RDW: 12.6 % (ref 11.5–15.5)
WBC: 7.4 10*3/uL (ref 4.0–10.5)

## 2024-03-26 LAB — BASIC METABOLIC PANEL WITH GFR
BUN: 10 mg/dL (ref 6–23)
CO2: 25 meq/L (ref 19–32)
Calcium: 9.4 mg/dL (ref 8.4–10.5)
Chloride: 102 meq/L (ref 96–112)
Creatinine, Ser: 0.63 mg/dL (ref 0.40–1.20)
GFR: 94.11 mL/min (ref >60.00)
Glucose, Bld: 103 mg/dL — ABNORMAL HIGH (ref 70–99)
Potassium: 3.7 meq/L (ref 3.5–5.1)
Sodium: 134 meq/L — ABNORMAL LOW (ref 135–145)

## 2024-03-26 LAB — C-REACTIVE PROTEIN: CRP: <1 mg/dL (ref 0.5–20.0)

## 2024-03-26 MED ORDER — FLUTICASONE-SALMETEROL 250-50 MCG/ACT IN AEPB: 1.0000 | INHALATION_SPRAY | Freq: Two times a day (BID) | RESPIRATORY_TRACT | 11 refills | Status: AC

## 2024-03-26 NOTE — Progress Notes (Signed)
 Synopsis: Referred in February 2023 for abnormal chest imaging by Alexander Iba, PA  Subjective:   PATIENT ID: Lindsey Knight GENDER: female DOB: May 26, 1961, MRN: [id number]  HPI  Chief Complaint  Patient presents with   Follow-up   Lindsey Knight is a 63 year old woman, never smoker with history of asthma who returns to pulmonary clinic for asthma.   She has been seeing ENT at [facility] and [facility] hospital for her chronic pansinusitis. 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 She is awaiting evaluation by Dermatology. Leg lesions have improved somewhat. Her respiratory and sinus symptoms improved while on dupixent .  There is possible concern for aspergillus sinus infection given her on going sinusitis issues with elevated IgE and eosinophil levels. No aspergillus has been cultured from her sinus procedures and no antibody levels checked yet.   She continues on adviar inhaler for her asthma.   OV 05/18/23 She reports sinus drainage with post nasal drip and some sinus pressure over recent weeks. She has significant cough that can keep her up at night. She is using advair 250-50mcg 1 puff twice daily and as needed albuterol . She is using flonase 1 spray per nostril daily. She continues to have reflux symptoms despite famotidine 20mg  twice daily.   OV 10/09/22 She reports increased cough over the past 2 months. She describes an event where she was bending over in the community garden and possibly had a significant reflux episode. She denies any viral illnesses recently.   She saw GI 08/01/22 with plan to reduce PPI dosing then transition to famotidine for 2 weeks then to use as needed. She reports the famotidine did not help, she reports burning sensation in her chest and throat. She then started omeprazole OTC which seems to have reduced her reflux symptoms.   She has advair 250-35mcg 1 puff twice daily that she has been  using as needed. She has albuterol  as needed as well. She has not felt much relief.   She has not been on steroids or antibiotics.   OV 03/06/22 Chest x-ray today shows clearing of the right upper lobe infiltrates. Will await final radiology read.   She is using advair 100-50mcg 1 puff twice daily as needed along with daily allegra and montelukast  for her asthma. No night time awakenings. No issues with spring allergies.  She has been feeling well since last visit. He cough has significantly decreased. She recently had EGD and C-scope and started on PPI therapy for GERD.   OV 01/03/22 Patient reports having respiratory illness in mid-December 2022 and was treated with Zpak on 12/13. PCP note from 11/08/2021 reviewed. Chest x-ray on 12/30 showed increased lung markings with mild right mid lung and left basilar linear scaring or atelectasis. She was also noted to have increased platelet count. She had CT Chest on 11/29/21 that showed mediastinal, axillary and hilar lymphadenopathy along with patchy ground-glass and adjacent focal clustered nodularity in the right upper lobe and adjacent band like atelectasis/scarring at the junction of the right upper lobe.   Patient reports resolution of her infectious symptoms from December and is feeling at her baseline. She will occasionally have some right sided discomfort but it is overall improved from December. She notices the pain when laying down or moving when she sleeps. She denies any shortness of breath, fevers, chills or night sweats. She denies hemoptysis. She does have joint aches in general which include her  knees, hips and occasional hand stiffness in the mornings.  She has history of asthma and mainly has issues with reactive airways disease when triggered by viral infections and uses inhalers as needed. She denies issues with seasonal allergies. She had sinus surgery in the past with significant reduction in frequency of sinus infections.   She is a  never smoker. She is a retired Engineer, civil (consulting). She has a sister with crohn's disease and her mother had colon cancer.   Past Medical History:  Diagnosis Date   Allergy  Child   Anemia    Arthritis    Asthma    Eczema    GERD (gastroesophageal reflux disease) 10/22   Hiatal hernia    Osteoporosis    Torn ligament    pt reports torn ligament in right ankle, use caution when turning   Ulcer 6/23   Vaginal delivery    x 2, 1985, 1990     Family History  Problem Relation Age of Onset   Colon cancer Mother    Cancer Mother    Hearing loss Mother    Heart failure Father    Asthma Father    COPD Father    Hearing loss Father    Crohn's disease Sister    Pancreatic cancer Other    Ovarian cancer Other    Uterine cancer Other    Allergic rhinitis Grandson    Food Allergy  Grandson    Asthma Daughter      Social History   Socioeconomic History   Marital status: Married    Spouse name: Not on file   Number of children: Not on file   Years of education: Not on file   Highest education level: Bachelor's degree (e.g., BA, AB, BS)  Occupational History   Not on file  Tobacco Use   Smoking status: Never    Passive exposure: Never   Smokeless tobacco: Never  Vaping Use   Vaping status: Never Used  Substance and Sexual Activity   Alcohol use: Not Currently   Drug use: Not Currently    Types: Marijuana    Comment: THC for sleep   Sexual activity: Not Currently    Partners: Male    Birth control/protection: Post-menopausal    Comment: menarche 63yo, sexual debut 63yo  Other Topics Concern   Not on file  Social History Narrative   Retired Engineer, civil (consulting)   Married   Has two local daughters and grandchildren   Social Drivers of Corporate investment banker Strain: Low Risk  (02/03/2024)   Overall Financial Resource Strain (CARDIA)    Difficulty of Paying Living Expenses: Not hard at all  Food Insecurity: No Food Insecurity (02/03/2024)   Hunger Vital Sign    Worried About Running Out of  Food in the Last Year: Never true    Ran Out of Food in the Last Year: Never true  Transportation Needs: No Transportation Needs (02/03/2024)   PRAPARE - Administrator, Civil Service (Medical): No    Lack of Transportation (Non-Medical): No  Physical Activity: Insufficiently Active (02/03/2024)   Exercise Vital Sign    Days of Exercise per Week: 3 days    Minutes of Exercise per Session: 40 min  Stress: No Stress Concern Present (02/03/2024)   [employer] of Occupational Health - Occupational Stress Questionnaire    Feeling of Stress : Only a little  Social Connections: Moderately Integrated (02/03/2024)   Social Connection and Isolation Panel [NHANES]    Frequency of Communication  with Friends and Family: More than three times a week    Frequency of Social Gatherings with Friends and Family: Twice a week    Attends Religious Services: Never    Database administrator or Organizations: Yes    Attends Engineer, structural: More than 4 times per year    Marital Status: Married  Catering manager Violence: Not on file     Allergies  Allergen Reactions   Other Hives    Adhesives   Dupixent  [Dupilumab ]     Erythema nodosum?   Tape Rash     Outpatient Medications Prior to Visit  Medication Sig Dispense Refill   albuterol  (VENTOLIN  HFA) 108 (90 Base) MCG/ACT inhaler Inhale 2 puffs into the lungs every 4 (four) hours as needed for wheezing or shortness of breath (coughing fits). 18 g 1   alendronate  (FOSAMAX ) 70 MG tablet Take 1 tablet (70 mg total) by mouth every 7 (seven) days. Take with a full glass of water on an empty stomach. 12 tablet 4   Ascorbic Acid (VITAMIN C CR) 500 MG TBCR Take 1 tablet by mouth every other day.     benzonatate  (TESSALON ) 100 MG capsule Take 1 capsule (100 mg total) by mouth 3 (three) times daily as needed. 30 capsule 2   budesonide (PULMICORT) 0.25 MG/2ML nebulizer solution Take 0.25 mg by nebulization 2 (two) times daily. Nasal rinse      Cetirizine HCl (ZYRTEC PO) Take by mouth.     Cholecalciferol (VITAMIN D3 ADULT GUMMIES PO)      clobetasol  ointment (TEMOVATE ) 0.05 % Apply 1 Application topically 2 (two) times daily. Use for two weeks at a time. 30 g 0   Cyanocobalamin  (VITAMIN B-12 PO) Take 1,000 mcg by mouth daily in the afternoon.     estradiol  (ESTRACE  VAGINAL) 0.1 MG/GM vaginal cream Place 1 g vaginally 3 (three) times a week. 42.5 g 12   famotidine (PEPCID) 20 MG tablet Take 20 mg by mouth daily.     MAGNESIUM GLYCINATE PO      Melatonin 5 MG CHEW Chew by mouth.     montelukast  (SINGULAIR ) 10 MG tablet TAKE 1 TABLET BY MOUTH EVERY NIGHT AT BEDTIME 90 tablet 3   Multiple Vitamins-Minerals (MULTIVITAMIN ADULT) CHEW      traMADol  (ULTRAM ) 50 MG tablet Take 1 tablet (50 mg total) by mouth every 6 (six) hours as needed for moderate pain (pain score 4-6) or severe pain (pain score 7-10) (headache). 30 tablet 0   traZODone  (DESYREL ) 100 MG tablet TAKE 1 TABLET BY MOUTH EVERY NIGHT AT BEDTIME 90 tablet 0   No facility-administered medications prior to visit.    Review of Systems  Constitutional:  Negative for chills, fever, malaise/fatigue and weight loss.  HENT:  Negative for congestion, sinus pain and sore throat.   Eyes: Negative.   Respiratory:  Positive for cough and sputum production. Negative for hemoptysis, shortness of breath and wheezing.   Cardiovascular:  Negative for chest pain, palpitations, orthopnea, claudication and leg swelling.  Gastrointestinal:  Positive for heartburn. Negative for abdominal pain, nausea and vomiting.  Genitourinary: Negative.   Musculoskeletal:  Negative for myalgias.  Skin:  Negative for rash.  Neurological:  Negative for weakness.  Endo/Heme/Allergies: Negative.   Psychiatric/Behavioral: Negative.      Objective:   Vitals:   03/26/24 1057  BP: 115/75  Pulse: 88  SpO2: 97%  Weight: 142 lb (64.4 kg)  Height: 5\' 3"  (1.6 m)    Physical  Exam Constitutional:       General: She is not in acute distress.    Appearance: She is not ill-appearing.  HENT:     Head: Normocephalic and atraumatic.  Eyes:     General: No scleral icterus.    Conjunctiva/sclera: Conjunctivae normal.  Cardiovascular:     Rate and Rhythm: Normal rate and regular rhythm.     Pulses: Normal pulses.     Heart sounds: Normal heart sounds. No murmur heard. Pulmonary:     Effort: Pulmonary effort is normal.     Breath sounds: Normal breath sounds. No wheezing, rhonchi or rales.  Musculoskeletal:     Right lower leg: No edema.     Left lower leg: No edema.  Skin:    General: Skin is warm and dry.  Neurological:     Mental Status: She is alert.    CBC    Component Value Date/Time   WBC 7.6 02/04/2024 0951   RBC 4.43 02/04/2024 0951   HGB 13.3 02/04/2024 0951   HCT 38.9 02/04/2024 0951   PLT 371.0 02/04/2024 0951   MCV 87.9 02/04/2024 0951   MCHC 34.1 02/04/2024 0951   RDW 13.6 02/04/2024 0951   LYMPHSABS 2.6 02/04/2024 0951   MONOABS 0.6 02/04/2024 0951   EOSABS 0.1 02/04/2024 0951   BASOSABS 0.0 02/04/2024 0951      Latest Ref Rng & Units 02/04/2024    9:51 AM 08/20/2023   10:31 AM 08/10/2023   12:12 PM  BMP  Glucose 70 - 99 mg/dL 027  253  664   BUN 6 - 23 mg/dL 11  10  12    Creatinine 0.40 - 1.20 mg/dL 4.03  4.74  2.59   Sodium 135 - 145 mEq/L 136  136  136   Potassium 3.5 - 5.1 mEq/L 3.9  3.9  4.0   Chloride 96 - 112 mEq/L 104  104  103   CO2 19 - 32 mEq/L 26  26  25    Calcium 8.4 - 10.5 mg/dL 9.1  8.9  9.2    Chest imaging: CXR 03/06/22 Improved opacities of the right mid lung.  CT Chest 11/29/21 Patchy ground-glass and adjacent focal clustered nodularity in the right upper lobe and adjacent band like atelectasis/scarring at the junction of the right upper middle lobes. This is consistent with an infectious/inflammatory process. Recommend follow-up chest CT in 3 months to ensure resolution.   Multiple prominent mediastinal and axillary lymph nodes  with an enlarged right hilar lymph node and left axillary lymph node. These are favored to be reactive, attention recommended on follow-up CT.   Additional 5 mm ground-glass nodular opacity in the left upper lobe, which may or may not be related to the aforementioned infectious/inflammatory process. This can be reassessed on follow-up CT.  CXR 11/18/21 Chronic appearing increased lung markings with mild mid right lung and left basilar linear scarring and/or atelectasis.  PFT:    Latest Ref Rng & Units 03/03/2024    9:17 AM  PFT Results  FVC-Pre L 2.83  P  FVC-Predicted Pre % 91  P  FVC-Post L 2.94  P  FVC-Predicted Post % 94  P  Pre FEV1/FVC % % 75  P  Post FEV1/FCV % % 82  P  FEV1-Pre L 2.12  P  FEV1-Predicted Pre % 89  P  FEV1-Post L 2.40  P  DLCO uncorrected ml/min/mmHg 19.00  P  DLCO UNC% % 98  P  DLCO corrected ml/min/mmHg 19.06  P  DLCO COR %Predicted % 98  P  DLVA Predicted % 98  P  TLC L 4.64  P  TLC % Predicted % 94  P  RV % Predicted % 85  P    P Preliminary result    Labs:  Path: Peripheral Smear 11/18/21 WBC are unremarkable. The overall findings in this smear are consistent with a reactive thrombocytosis. Clinical correlation is recommended. Anemia with RBCs which appear to be microcytic and hypochromic on smear review. Suggest evaluation for iron deficiency and/or a source of blood loss, if clinically indicated.   Echo:  Heart Catheterization:    Assessment & Plan:   No diagnosis found.  Discussion: Lindsey Knight is a 63 year old woman, never smoker with history of asthma who returns to pulmonary clinic for cough.   Asthma Cough Chronic, non-productive cough improved with Advair. No significant dyspnea or wheezing. - Continue Advair for cough management. - Prescribe Advair discus 250 mcg. - Check CBC and chemistry panel.  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 Concerns about Aspergillus despite  negative cultures.  - Order Aspergillus antibody testing and allergy  panel. - Check inflammatory labs - Consider further sinus surgery if recurrent issues persist.  Hx of mediastinal lymphadenopathy - Order CT scan with contrast to evaluate lymph nodes.  Elevated IgE Previously elevated IgE managed with Dupixent , now normalized. Potential link to chronic sinusitis and nasal polyps. - Order inflammatory tests to assess current IgE levels.  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 Difficulty obtaining dermatology evaluation for biopsy. - Arrange for skin biopsy to evaluate erythema nodosum. - Consider referral to infectious disease or general surgery for biopsy if dermatology is unavailable.  Duaine German, MD [facility] Office: [phone]    Current Outpatient Medications:    albuterol  (VENTOLIN  HFA) 108 (90 Base) MCG/ACT inhaler, Inhale 2 puffs into the lungs every 4 (four) hours as needed for wheezing or shortness of breath (coughing fits)., Disp: 18 g, Rfl: 1   alendronate  (FOSAMAX ) 70 MG tablet, Take 1 tablet (70 mg total) by mouth every 7 (seven) days. Take with a full glass of water on an empty stomach., Disp: 12 tablet, Rfl: 4   Ascorbic Acid (VITAMIN C CR) 500 MG TBCR, Take 1 tablet by mouth every other day., Disp: , Rfl:    benzonatate  (TESSALON ) 100 MG capsule, Take 1 capsule (100 mg total) by mouth 3 (three) times daily as needed., Disp: 30 capsule, Rfl: 2   budesonide (PULMICORT) 0.25 MG/2ML nebulizer solution, Take 0.25 mg by nebulization 2 (two) times daily. Nasal rinse, Disp: , Rfl:    Cetirizine HCl (ZYRTEC PO), Take by mouth., Disp: , Rfl:    Cholecalciferol (VITAMIN D3 ADULT GUMMIES PO), , Disp: , Rfl:    clobetasol  ointment (TEMOVATE ) 0.05 %, Apply 1 Application topically 2 (two) times daily. Use for two weeks at a time., Disp: 30 g, Rfl: 0   Cyanocobalamin  (VITAMIN B-12 PO), Take 1,000 mcg by mouth  daily in the afternoon., Disp: , Rfl:    estradiol  (ESTRACE  VAGINAL) 0.1 MG/GM vaginal cream, Place 1 g vaginally 3 (three) times a week., Disp: 42.5 g, Rfl: 12   famotidine (PEPCID) 20 MG tablet, Take 20 mg by mouth daily., Disp: , Rfl:    MAGNESIUM GLYCINATE PO, , Disp: , Rfl:    Melatonin 5 MG CHEW, Chew by mouth., Disp: , Rfl:    montelukast  (SINGULAIR ) 10 MG tablet, TAKE 1  TABLET BY MOUTH EVERY NIGHT AT BEDTIME, Disp: 90 tablet, Rfl: 3   Multiple Vitamins-Minerals (MULTIVITAMIN ADULT) CHEW, , Disp: , Rfl:    traMADol  (ULTRAM ) 50 MG tablet, Take 1 tablet (50 mg total) by mouth every 6 (six) hours as needed for moderate pain (pain score 4-6) or severe pain (pain score 7-10) (headache)., Disp: 30 tablet, Rfl: 0   traZODone  (DESYREL ) 100 MG tablet, TAKE 1 TABLET BY MOUTH EVERY NIGHT AT BEDTIME, Disp: 90 tablet, Rfl: 0

## 2024-03-26 NOTE — Patient Instructions (Addendum)
 Continue advair 250-50mcg 1 puff twice daily - rinse mouth out after each use  Use albuterol  inhaler as needed  We will check labs today for aspergillus and inflammatory conditions  We will order a CT Chest scan  Follow up in 3 months, call sooner if needed

## 2024-03-30 ENCOUNTER — Encounter: Payer: Self-pay | Admitting: Pulmonary Disease

## 2024-04-01 LAB — RESPIRATORY ALLERGY PROFILE REGION II ~~LOC~~

## 2024-04-01 LAB — ASPERGILLUS IGE PANEL
A. Amstel/Glaucu Class Interp: 0
A. Flavus Class Interp: 0
A. Fumigatus Class Interp: 0
A. Nidulans Class Interp: 0
A. Niger Class Interp: 0
A. Versicolor Class Interp: 0
Aspergillus amstel/glaucu IgE*: <0.35 kU/L (ref <0.35)
Aspergillus flavus IgE: <0.35 kU/L (ref <0.35)
Aspergillus fumigatus IgE: <0.1 kU/L (ref <0.35)
Aspergillus nidulans IgE: <0.35 kU/L (ref <0.35)
Aspergillus niger IgE: <0.1 kU/L (ref <0.35)
Aspergillus versicolor IgE: <0.1 kU/L (ref <0.35)

## 2024-04-01 LAB — ANA: Anti Nuclear Antibody (ANA): NEGATIVE

## 2024-04-01 LAB — CYCLIC CITRUL PEPTIDE ANTIBODY, IGG: Cyclic Citrullin Peptide Ab: <16 U

## 2024-04-01 LAB — RHEUMATOID FACTOR: Rheumatoid fact SerPl-aCnc: 12 [IU]/mL (ref <14)

## 2024-04-01 LAB — HYPERSENSITIVITY PNEUMONITIS
A. Pullulans Abs: NEGATIVE
A.Fumigatus #1 Abs: NEGATIVE
Micropolyspora faeni, IgG: NEGATIVE
Pigeon Serum Abs: NEGATIVE
Thermoact. Saccharii: NEGATIVE
Thermoactinomyces vulgaris, IgG: NEGATIVE

## 2024-04-01 LAB — ANCA SCREEN W REFLEX TITER: ANCA SCREEN: NEGATIVE

## 2024-04-01 LAB — ASPERGILLUS ANTIBODIES,IMMUNODIFFUSION,SERUM
Aspergillus Fumigatus: NEGATIVE
Aspergillus flavus: NEGATIVE
Aspergillus niger: NEGATIVE

## 2024-04-01 LAB — ANTI-SMITH ANTIBODY: ENA SM Ab Ser-aCnc: <1 AI

## 2024-04-01 LAB — SJOGREN'S SYNDROME ANTIBODS(SSA + SSB)
SSA (Ro) (ENA) Antibody, IgG: <1 AI
SSB (La) (ENA) Antibody, IgG: <1 AI

## 2024-04-01 LAB — INTERPRETATION:

## 2024-04-02 ENCOUNTER — Encounter: Payer: Self-pay | Admitting: Dermatology

## 2024-04-02 ENCOUNTER — Ambulatory Visit: Admitting: Dermatology

## 2024-04-02 ENCOUNTER — Ambulatory Visit
Admission: RE | Admit: 2024-04-02 | Discharge: 2024-04-02 | Disposition: A | Discharge status: Home / Self Care | Source: Ambulatory Visit | Attending: Pulmonary Disease | Admitting: Pulmonary Disease

## 2024-04-02 VITALS — BP 110/74 | HR 104

## 2024-04-02 DIAGNOSIS — R591 Generalized enlarged lymph nodes: Secondary | ICD-10-CM | POA: Diagnosis not present

## 2024-04-02 DIAGNOSIS — R590 Localized enlarged lymph nodes: Secondary | ICD-10-CM | POA: Insufficient documentation

## 2024-04-02 DIAGNOSIS — K449 Diaphragmatic hernia without obstruction or gangrene: Secondary | ICD-10-CM | POA: Diagnosis not present

## 2024-04-02 DIAGNOSIS — D492 Neoplasm of unspecified behavior of bone, soft tissue, and skin: Secondary | ICD-10-CM | POA: Diagnosis not present

## 2024-04-02 DIAGNOSIS — L52 Erythema nodosum: Secondary | ICD-10-CM | POA: Diagnosis not present

## 2024-04-02 DIAGNOSIS — M793 Panniculitis, unspecified: Secondary | ICD-10-CM | POA: Diagnosis not present

## 2024-04-02 DIAGNOSIS — J339 Nasal polyp, unspecified: Secondary | ICD-10-CM

## 2024-04-02 DIAGNOSIS — D485 Neoplasm of uncertain behavior of skin: Secondary | ICD-10-CM

## 2024-04-02 DIAGNOSIS — J329 Chronic sinusitis, unspecified: Secondary | ICD-10-CM

## 2024-04-02 MED ORDER — PREDNISONE 10 MG PO TABS: ORAL_TABLET | ORAL | 0 refills | Status: AC

## 2024-04-02 MED ORDER — MUPIROCIN 2 % EX OINT: 1.0000 | TOPICAL_OINTMENT | Freq: Two times a day (BID) | CUTANEOUS | 3 refills | Status: DC

## 2024-04-02 MED ORDER — IOHEXOL 300 MG/ML  SOLN
75.0000 mL | Freq: Once | INTRAMUSCULAR | Status: AC | PRN
  Administered 2024-04-02: 75 mL via INTRAVENOUS

## 2024-04-02 NOTE — Patient Instructions (Addendum)
 Hello Lindsey Knight,  Thank you for visiting today. Here is a summary of the key instructions:  Medications: - Start prednisone  taper:   - 40 mg (4 tablets) for 4 days   - 30 mg (3 tablets) for 4 days   - 20 mg (2 tablets) for 4 days   - 10 mg (1 tablet) for 4 days - Take all prednisone  tablets in the morning - Apply mupirocin  ointment to biopsy site daily with a Band-Aid - Continue Allegra in the morning - Continue Singulair  in the evening - Continue budesonide sinus rinse once a day  Lifestyle Changes: - Switch to decaf coffee while on prednisone  - Avoid too much salt while on prednisone  - Keep legs elevated - Avoid swimming pools and hot tubs  Wound Care: - Apply Pearson ointment with a Band-Aid daily to biopsy site - OK to get biopsy site wet in shower starting tomorrow - Avoid submerging biopsy site in water  Follow-up: - Return in 2 weeks for suture removal and biopsy results review  Please reach out if you have any questions or concerns.  Warm regards,  Dr. Louana Roup [facility]   Patient Handout: Wound Care for Skin Biopsy Site  Taking Care of Your Skin Biopsy Site  Proper care of the biopsy site is essential for promoting healing and minimizing scarring. This handout provides instructions on how to care for your biopsy site to ensure optimal recovery.  1. Cleaning the Wound:  Clean the biopsy site daily with gentle soap and water. Gently pat the area dry with a clean, soft towel. Avoid harsh scrubbing or rubbing the area, as this can irritate the skin and delay healing.  2. Applying Aquaphor and Bandage:  After cleaning the wound, apply a thin layer of Aquaphor ointment to the biopsy site. Cover the area with a sterile bandage to protect it from dirt, bacteria, and friction. Change the bandage daily or as needed if it becomes soiled or wet.  3. Continued Care for One Week:  Repeat the cleaning, Aquaphor application, and bandaging process daily for  one week following the biopsy procedure. Keeping the wound clean and moist during this initial healing period will help prevent infection and promote optimal healing.  4. Massaging Aquaphor into the Area:  ---After one week, discontinue the use of bandages but continue to apply Aquaphor to the biopsy site. ----Gently massage the Aquaphor into the area using circular motions. ---Massaging the skin helps to promote circulation and prevent the formation of scar tissue.   Additional Tips:  Avoid exposing the biopsy site to direct sunlight during the healing process, as this can cause hyperpigmentation or worsen scarring. If you experience any signs of infection, such as increased redness, swelling, warmth, or drainage from the wound, contact your healthcare provider immediately. Follow any additional instructions provided by your healthcare provider for caring for the biopsy site and managing any discomfort. Conclusion:  Taking proper care of your skin biopsy site is crucial for ensuring optimal healing and minimizing scarring. By following these instructions for cleaning, applying Aquaphor, and massaging the area, you can promote a smooth and successful recovery. If you have any questions or concerns about caring for your biopsy site, don't hesitate to contact your healthcare provider for guidance.       Important Information   Due to recent changes in healthcare laws, you may see results of your pathology and/or laboratory studies on MyChart before the doctors have had a chance to review them. We understand that  in some cases there may be results that are confusing or concerning to you. Please understand that not all results are received at the same time and often the doctors may need to interpret multiple results in order to provide you with the best plan of care or course of treatment. Therefore, we ask that you please give us  2 business days to thoroughly review all your results before  contacting the office for clarification. Should we see a critical lab result, you will be contacted sooner.     If You Need Anything After Your Visit   If you have any questions or concerns for your doctor, please call our main line at [phone]. If no one answers, please leave a voicemail as directed and we will return your call as soon as possible. Messages left after 4 pm will be answered the following business day.    You may also send us  a message via MyChart. We typically respond to MyChart messages within 1-2 business days.  For prescription refills, please ask your pharmacy to contact our office. Our fax number is [phone].  If you have an urgent issue when the clinic is closed that cannot wait until the next business day, you can page your doctor at the number below.     Please note that while we do our best to be available for urgent issues outside of office hours, we are not available 24/7.    If you have an urgent issue and are unable to reach us , you may choose to seek medical care at your doctor's office, retail clinic, urgent care center, or emergency room.   If you have a medical emergency, please immediately call 911 or go to the emergency department. In the event of inclement weather, please call our main line at [phone] for an update on the status of any delays or closures.  Dermatology Medication Tips: Please keep the boxes that topical medications come in in order to help keep track of the instructions about where and how to use these. Pharmacies typically print the medication instructions only on the boxes and not directly on the medication tubes.   If your medication is too expensive, please contact our office at [phone] or send us  a message through MyChart.    We are unable to tell what your co-pay for medications will be in advance as this is different depending on your insurance coverage. However, we may be able to find a substitute medication at  lower cost or fill out paperwork to get insurance to cover a needed medication.    If a prior authorization is required to get your medication covered by your insurance company, please allow us  1-2 business days to complete this process.   Drug prices often vary depending on where the prescription is filled and some pharmacies may offer cheaper prices.   The website www.goodrx.com contains coupons for medications through different pharmacies. The prices here do not account for what the cost may be with help from insurance (it may be cheaper with your insurance), but the website can give you the price if you did not use any insurance.  - You can print the associated coupon and take it with your prescription to the pharmacy.  - You may also stop by our office during regular business hours and pick up a GoodRx coupon card.  - If you need your prescription sent electronically to a different pharmacy, notify our office through [facility] or by phone at [phone]

## 2024-04-02 NOTE — Progress Notes (Signed)
 Follow-Up Visit   Subjective  Lindsey Knight is a 63 y.o. female who presents for the following: Lumps on legs  Patient present today for visit for new lumps on B/L LE. Patient was last evaluated on 05/15/23. Patient was prescribed Dupixent  in October 2024 for Nasal Polyps. She developed these lumps in March. She reports the lumps are red and sometimes sore and itchy. She discontinued Dupixent  in March because her PCP suspected Erythema Nodosum. She has not had any Ultrasound or Scans. Patient rates her discomfort 8 out of 10 if she bumps it. Patient denies medication changes.  The following portions of the chart were reviewed this encounter and updated as appropriate: medications, allergies, medical history  Review of Systems:  No other skin or systemic complaints except as noted in HPI or Assessment and Plan.  Objective  Well appearing patient in no apparent distress; mood and affect are within normal limits.  A focused examination was performed of the following areas: B/L LE  Relevant exam findings are noted in the Assessment and Plan.  Component     Latest Ref Rng 03/26/2024  Aspergillus fumigatus IgE     <0.35 kU/L <0.10   A. Fumigatus Class Interp 0   Aspergillus [place] IgE     <0.35 kU/L <0.10   A. [place] Class Interp 0   Aspergillus Versicolor IgE     <0.35 kU/L <0.10   A. Versicolor Class Interp 0   Aspergillus amstel/glaucu IgE*     <0.35 kU/L <0.35   A. Amstel/Glaucu Class Interp 0   Aspergillus flavus IgE     <0.35 kU/L <0.35   A. Flavus Class Interp 0   Aspergillus nidulans IgE     <0.35 kU/L <0.35   A. Nidulans Class Interp 0   Sodium     135 - 145 mEq/L 134 (L)   Potassium     3.5 - 5.1 mEq/L 3.7   Chloride     96 - 112 mEq/L 102   CO2     19 - 32 mEq/L 25   Glucose     70 - 99 mg/dL 562 (H)   BUN     6 - 23 mg/dL 10   Creatinine     1.30 - 1.20 mg/dL 8.65   GFR     >78.46 mL/min 94.11   Calcium     8.4 - 10.5 mg/dL 9.4   A.Fumigatus #1 Abs      Negative  Negative   Micropolyspora faeni, IgG     Negative  Negative   Thermoactinomyces vulgaris, IgG     Negative  Negative   A. Pullulans Abs     Negative  Negative   Thermoact. Saccharii     Negative  Negative   Pigeon Serum Abs     Negative  Negative   Iron     42 - 145 ug/dL   Transferrin     962.9 - 360.0 mg/dL   Saturation Ratios     20.0 - 50.0 %   Ferritin     10.0 - 291.0 ng/mL   TIBC     250.0 - 450.0 mcg/dL   MICRO NUMBER:   SPECIMEN QUALITY:   Sample Source   STATUS:   Result   Aspergillus flavus     Negative  Negative   Aspergillus [place]     Negative  Negative   Aspergillus Fumigatus     Negative  Negative   SSA (Ro) (ENA) Antibody, IgG     <  1.0 NEG AI <1.0 NEG   SSB (La) (ENA) Antibody, IgG     <1.0 NEG AI <1.0 NEG   Tryptase     2.2 - 13.2 ug/L   Hemoglobin A1C     4.6 - 6.5 %   ANCA SCREEN     Negative  Negative   RA Latex Turbid.     <14 IU/mL 12   Cyclic Citrullin Peptide Ab     UNITS <16   CRP     0.5 - 20.0 mg/dL <1.6   Anti Nuclear Antibody (ANA)     NEGATIVE  NEGATIVE   ENA SM Ab Ser-aCnc     <1.0 NEG AI <1.0 NEG   Interpretation --     Legend: (L) Low (H) High  Left Lower Leg - Posterior 3 tender erythematous nodules Located at the left Calf  Assessment & Plan   1. Erythema nodosum - Assessment: Patient developed tender nodules on legs in March 2025, diagnosed as erythema nodosum. Extensive workup including ACE levels, strep test, autoimmune tests, and CT scan for sarcoidosis were negative. ESR was slightly elevated. Patient started Dupixent  in October 2024 for nasal polyps, which is noted to have erythema nodosum as a rare side effect. Other potential causes such as infections and autoimmune conditions have been ruled out. Topical clobetasol  was ineffective, likely due to the deep nature of the inflammation. Pt has been taking oral NSAID almost daily with minimal relief  - Plan:    Perform punch biopsy of affected area      - Informed consent obtained     - Local anesthesia administered     - Patient instructed on post-biopsy care: keep legs elevated, avoid swimming pools and hot tubs    Prescribe prednisone  taper: 40 mg PO daily, tapering over 16 days     - 4 tablets (40 mg) for 4 days     - 3 tablets (30 mg) for 4 days     - 2 tablets (20 mg) for 4 days     - 1 tablet (10 mg) for 4 days     - Take all doses in the morning     - Advised patient about potential side effects: changes in appetite, blood pressure, and jitters     - Recommended monitoring blood pressure if headaches occur    Prescribe mupirocin  ointment for topical application with Band-Aid daily    Follow-up in 2 weeks for suture removal and biopsy result review    Advised patient to avoid water submersion but okay to shower starting tomorrow  2. Chronic rhinosinusitis with nasal polyps - Assessment: Patient has a history of chronic rhinosinusitis with nasal polyps, diagnosed in December 2024 during sinus surgery. Started on Dupixent  in October 2024, which has shown improvement in sinus polyps. Currently on Allegra in the morning and Singulair  in the evening. Patient used sinus rinses with budesonide and gentamicin in January and February 2025, discontinued gentamicin after two months, and is now using budesonide once daily. - Plan:    Dupixent  STOPPED due to suspected cause of EN lesions    Continue Allegra in the morning and Singulair  in the evening    Continue budesonide sinus rinse once daily    Follow-up with ENT and pulmonology in August or September as scheduled   Pre-visit planning reviewing the last office visit, labs, imaging and care everywhere when applicable was 15 minutes Intra-visit was 20 minutes and included updating the relevant history, performing a video or in-person  physical exam as appropriate, creating a treatment plan and used shared decision making with the patient.  Post-visit was 8-10 minutes that encompassed note  completion, placing of orders, updating patient instructions and coordination of care.   NEOPLASM OF UNCERTAIN BEHAVIOR OF SKIN Left Lower Leg - Posterior Skin / nail biopsy Type of biopsy: punch   Informed consent: discussed and consent obtained   Timeout: patient name, date of birth, surgical site, and procedure verified   Procedure prep:  Patient was prepped and draped in usual sterile fashion Prep type:  Isopropyl alcohol Anesthesia: the lesion was anesthetized in a standard fashion   Anesthetic:  1% lidocaine  w/ epinephrine  1-100,000 buffered w/ 8.4% NaHCO3 Punch size:  4 mm Suture size:  4-0 Suture type: nylon   Suture removal (days):  14 Hemostasis achieved with: suture and aluminum chloride   Outcome: patient tolerated procedure well   Post-procedure details: sterile dressing applied and wound care instructions given   Dressing type: petrolatum gauze   Specimen 1 - Surgical pathology Differential Diagnosis: R/O Erythema Nodosum vs other  Check Margins: No Related Medications predniSONE  (DELTASONE ) 10 MG tablet Take 4 tablets (40 mg total) by mouth daily with breakfast for 4 days, THEN 3 tablets (30 mg total) daily with breakfast for 4 days, THEN 2 tablets (20 mg total) daily with breakfast for 4 days, THEN 1 tablet (10 mg total) daily with breakfast for 4 days. mupirocin  ointment (BACTROBAN ) 2 % Apply 1 Application topically 2 (two) times daily.  Return in about 2 weeks (around 04/16/2024) for Bx result and Suture Removal.  I, Jetta Ager, am acting as scribe for [employer], DO.  Documentation: I have reviewed the above documentation for accuracy and completeness, and I agree with the above.  Louana Roup, DO

## 2024-04-04 LAB — SURGICAL PATHOLOGY

## 2024-04-07 ENCOUNTER — Ambulatory Visit: Payer: Self-pay | Admitting: Dermatology

## 2024-04-15 NOTE — Progress Notes (Unsigned)
 Follow Up Note  RE: Lindsey Knight MRN: [id number] DOB: [birth year] Date of Office Visit: 04/16/2024  Referring provider: Alexander Iba, PA Primary care provider: Alexander Iba, PA  Chief Complaint: No chief complaint on file.  History of Present Illness: I had the pleasure of seeing Lindsey Knight for a follow up visit at the [facility] on 04/15/2024. She is a 63 y.o. female, who is being followed for adverse drug reaction, allergic rhinitis, nasal polyp, recurrent sinusitis, asthma. Her previous allergy  office visit was on 02/08/2024 with Dr. Burdette Carolin. Today is a regular follow up visit.  Discussed the use of AI scribe software for clinical note transcription with the patient, who gave verbal consent to proceed.  History of Present Illness            2025 labs: "Your IgE level was normal this time which is great. Your tryptase which checks for mast cell issues was normal as well. "  04/02/2024 derm visit: "1. Erythema nodosum - Assessment: Patient developed tender nodules on legs in March 2025, diagnosed as erythema nodosum. Extensive workup including ACE levels, strep test, autoimmune tests, and CT scan for sarcoidosis were negative. ESR was slightly elevated. Patient started Dupixent  in October 2024 for nasal polyps, which is noted to have erythema nodosum as a rare side effect. Other potential causes such as infections and autoimmune conditions have been ruled out. Topical clobetasol  was ineffective, likely due to the deep nature of the inflammation. Pt has been taking oral NSAID almost daily with minimal relief   - Plan:    Perform punch biopsy of affected area     - Informed consent obtained     - Local anesthesia administered     - Patient instructed on post-biopsy care: keep legs elevated, avoid swimming pools and hot tubs    Prescribe prednisone  taper: 40 mg PO daily, tapering over 16 days     - 4 tablets (40 mg) for 4 days     - 3 tablets (30 mg) for 4  days     - 2 tablets (20 mg) for 4 days     - 1 tablet (10 mg) for 4 days     - Take all doses in the morning     - Advised patient about potential side effects: changes in appetite, blood pressure, and jitters     - Recommended monitoring blood pressure if headaches occur    Prescribe mupirocin  ointment for topical application with Band-Aid daily    Follow-up in 2 weeks for suture removal and biopsy result review    Advised patient to avoid water submersion but okay to shower starting tomorrow   2. Chronic rhinosinusitis with nasal polyps - Assessment: Patient has a history of chronic rhinosinusitis with nasal polyps, diagnosed in December 2024 during sinus surgery. Started on Dupixent  in October 2024, which has shown improvement in sinus polyps. Currently on Allegra in the morning and Singulair  in the evening. Patient used sinus rinses with budesonide and gentamicin in January and February 2025, discontinued gentamicin after two months, and is now using budesonide once daily. - Plan:    Dupixent  STOPPED due to suspected cause of EN lesions    Continue Allegra in the morning and Singulair  in the evening    Continue budesonide sinus rinse once daily    Follow-up with ENT and pulmonology in August or September as scheduled"  Biopsy results: "MICROSCOPIC DESCRIPTION 1. Stasis-like change is present in the background, and in  the subcutis is a chronic lymphohistiocytic infiltrate with foam cells, irregularity of the adipocytes, and foreign body-type giant cells  peripherally.  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  While some features overlap with erythema nodosum, the degree of inflammation with foreign body giant cell reaction is more consistent with a traumatic/factitial process.  A rare lipomembrane is seen but the features are not diagnostic of lipodermatosclerosis.  Polarization in these sections is negative for foreign material.  In  addition, PAS staining is negative for fungal hyphae.  (NDS:kh 04/04/24) "  Assessment and Plan: Lindsey Knight is a 63 y.o. 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 Patient is not sure of exact timeline as she had severe headaches/migraines which she was focused on at that time. She started Dupixent  on 09/13/2023 and last injection was last week which now thinks flared these bumps on her legs. She had extensive bloodwork drawn by ENT on 01/23/2024 Stop Dupixent  injections given concerns about possibly contributing to this rash/bumps on the legs.  Document affected areas with photographs.   Seasonal allergic rhinitis due to pollen Allergic rhinitis due to mold Nose polyp PND Past history - sinus surgery in 2013. Currently on Zyrtec, Singulair , Atrovent , and budesonide nasal rinses. 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 No worsening symptoms after taking NSAIDs. Elevated IgE level. 2024 skin testing positive to weed and mold. Started Dupixent  on 09/13/2023. Sinus surgery on 10/24/2023.  Interim history - saw a different ENT who ordered extensive work up.  Continue recommendations as per your ENT.  Use Atrovent  (ipratropium) 0.03% 1-2 sprays per nostril twice a day as needed for runny nose/drainage.   Recurrent sinusitis 2025 labs normal immunoglobulin levels, tetanus, diptheria titers protective, borderline pneumococcal titers.  Keep track of infections and antibiotics use.   Moderate persistent asthma without complication Past history - managed by pulmonologist with Advair and Albuterol  as needed. Asthma exacerbations requiring prednisone  courses three times this year. Interim history - stopped Advair recently per pulmonology. Monitor symptoms.   Elevated IgE level Will recheck.    Return in about 2 months (around  04/09/2024). Assessment and Plan              No follow-ups on file.  No orders of the defined types were placed in this encounter.  Lab Orders  No laboratory test(s) ordered today    Diagnostics: Spirometry:  Tracings reviewed. Her effort: {Blank single:19197::"Good reproducible efforts.","It was hard to get consistent efforts and there is a question as to whether this reflects a maximal maneuver.","Poor effort, data can not be interpreted."} FVC: ***L FEV1: ***L, ***% predicted FEV1/FVC ratio: ***% Interpretation: {Blank single:19197::"Spirometry consistent with mild obstructive disease","Spirometry consistent with moderate obstructive disease","Spirometry consistent with severe obstructive disease","Spirometry consistent with possible restrictive disease","Spirometry consistent with mixed obstructive and restrictive disease","Spirometry uninterpretable due to technique","Spirometry consistent with normal pattern","No overt abnormalities noted given today's efforts"}.  Please see scanned spirometry results for details.  Skin Testing: {Blank single:19197::"Select foods","Environmental allergy  panel","Environmental allergy  panel and select foods","Food allergy  panel","None","Deferred due to recent antihistamines use"}. *** Results discussed with patient/family.   Medication List:  Current Outpatient Medications  Medication Sig Dispense Refill   albuterol  (VENTOLIN  HFA) 108 (90 Base) MCG/ACT inhaler Inhale 2 puffs into the lungs every 4 (four) hours as needed for wheezing or shortness of breath (coughing fits). 18 g 1   alendronate  (FOSAMAX ) 70  MG tablet Take 1 tablet (70 mg total) by mouth every 7 (seven) days. Take with a full glass of water on an empty stomach. 12 tablet 4   Ascorbic Acid (VITAMIN C CR) 500 MG TBCR Take 1 tablet by mouth every other day.     benzonatate  (TESSALON ) 100 MG capsule Take 1 capsule (100 mg total) by mouth 3 (three) times daily as needed. 30 capsule 2    budesonide (PULMICORT) 0.25 MG/2ML nebulizer solution Take 0.25 mg by nebulization 2 (two) times daily. Nasal rinse     Cetirizine HCl (ZYRTEC PO) Take by mouth.     Cholecalciferol (VITAMIN D3 ADULT GUMMIES PO)      clobetasol  ointment (TEMOVATE ) 0.05 % Apply 1 Application topically 2 (two) times daily. Use for two weeks at a time. 30 g 0   Cyanocobalamin  (VITAMIN B-12 PO) Take 1,000 mcg by mouth daily in the afternoon.     estradiol  (ESTRACE  VAGINAL) 0.1 MG/GM vaginal cream Place 1 g vaginally 3 (three) times a week. 42.5 g 12   famotidine (PEPCID) 20 MG tablet Take 20 mg by mouth daily.     fluconazole  (DIFLUCAN ) 100 MG tablet Take by mouth.     fluticasone -salmeterol (ADVAIR DISKUS) 250-50 MCG/ACT AEPB Inhale 1 puff into the lungs in the morning and at bedtime. 60 each 11   ipratropium (ATROVENT ) 0.03 % nasal spray Place into both nostrils.     MAGNESIUM GLYCINATE PO      Melatonin 5 MG CHEW Chew by mouth.     montelukast  (SINGULAIR ) 10 MG tablet TAKE 1 TABLET BY MOUTH EVERY NIGHT AT BEDTIME 90 tablet 3   Multiple Vitamins-Minerals (MULTIVITAMIN ADULT) CHEW      mupirocin  ointment (BACTROBAN ) 2 % Apply 1 Application topically 2 (two) times daily. 30 g 3   predniSONE  (DELTASONE ) 10 MG tablet Take 4 tablets (40 mg total) by mouth daily with breakfast for 4 days, THEN 3 tablets (30 mg total) daily with breakfast for 4 days, THEN 2 tablets (20 mg total) daily with breakfast for 4 days, THEN 1 tablet (10 mg total) daily with breakfast for 4 days. 40 tablet 0   traMADol  (ULTRAM ) 50 MG tablet Take 1 tablet (50 mg total) by mouth every 6 (six) hours as needed for moderate pain (pain score 4-6) or severe pain (pain score 7-10) (headache). 30 tablet 0   traZODone  (DESYREL ) 100 MG tablet TAKE 1 TABLET BY MOUTH EVERY NIGHT AT BEDTIME 90 tablet 0   No current facility-administered medications for this visit.   Allergies: Allergies  Allergen Reactions   Other Hives    Adhesives   Dupixent   [Dupilumab ]     Erythema nodosum?   Tape Rash   I reviewed her past medical history, social history, family history, and environmental history and no significant changes have been reported from her previous visit.  Review of Systems  Constitutional:  Negative for appetite change, chills, fever and unexpected weight change.  HENT:  Positive for postnasal drip, sinus pressure and sinus pain. Negative for rhinorrhea.   Eyes:  Negative for itching.  Respiratory:  Positive for cough. Negative for chest tightness, shortness of breath and wheezing.   Cardiovascular:  Negative for chest pain.  Gastrointestinal:  Negative for abdominal pain.  Genitourinary:  Negative for difficulty urinating.  Skin:  Positive for rash.  Allergic/Immunologic: Positive for environmental allergies.  Neurological:  Positive for headaches.    Objective: There were no vitals taken for this visit. There is no height  or weight on file to calculate BMI. Physical Exam Vitals and nursing note reviewed.  Constitutional:      Appearance: Normal appearance. She is well-developed.  HENT:     Head: Normocephalic and atraumatic.     Right Ear: Tympanic membrane and external ear normal.     Left Ear: Tympanic membrane and external ear normal.     Nose: No rhinorrhea.     Mouth/Throat:     Mouth: Mucous membranes are moist.     Pharynx: Oropharynx is clear.  Eyes:     Conjunctiva/sclera: Conjunctivae normal.  Cardiovascular:     Rate and Rhythm: Normal rate and regular rhythm.     Heart sounds: Normal heart sounds. No murmur heard.    No friction rub. No gallop.  Pulmonary:     Effort: Pulmonary effort is normal.     Breath sounds: Normal breath sounds. No wheezing, rhonchi or rales.  Musculoskeletal:     Cervical back: Neck supple.  Skin:    General: Skin is warm.     Findings: Rash present.     Comments: A few scattered circular discoloration on lower extremities. Some palpable "nodules" on calf area b/l.    Neurological:     Mental Status: She is alert and oriented to person, place, and time.  Psychiatric:        Behavior: Behavior normal.    Previous notes and tests were reviewed. The plan was reviewed with the patient/family, and all questions/concerned were addressed.  It was my pleasure to see Lindsey Knight today and participate in her care. Please feel free to contact me with any questions or concerns.  Sincerely,  Eudelia Hero, DO Allergy  & Immunology  [facility]  [place] office: [phone] [facility] office: [phone]

## 2024-04-16 ENCOUNTER — Ambulatory Visit (INDEPENDENT_AMBULATORY_CARE_PROVIDER_SITE_OTHER): Admitting: Allergy

## 2024-04-16 ENCOUNTER — Encounter: Payer: Self-pay | Admitting: Allergy

## 2024-04-16 ENCOUNTER — Ambulatory Visit (HOSPITAL_COMMUNITY)

## 2024-04-16 ENCOUNTER — Other Ambulatory Visit: Payer: Self-pay

## 2024-04-16 VITALS — BP 118/76 | HR 74 | Temp 98.5°F | Resp 18 | Ht 63.0 in | Wt 142.1 lb

## 2024-04-16 DIAGNOSIS — J301 Allergic rhinitis due to pollen: Secondary | ICD-10-CM

## 2024-04-16 DIAGNOSIS — J339 Nasal polyp, unspecified: Secondary | ICD-10-CM

## 2024-04-16 DIAGNOSIS — L52 Erythema nodosum: Secondary | ICD-10-CM

## 2024-04-16 DIAGNOSIS — T50995D Adverse effect of other drugs, medicaments and biological substances, subsequent encounter: Secondary | ICD-10-CM

## 2024-04-16 DIAGNOSIS — Z888 Allergy status to other drugs, medicaments and biological substances status: Secondary | ICD-10-CM

## 2024-04-16 DIAGNOSIS — R0982 Postnasal drip: Secondary | ICD-10-CM

## 2024-04-16 DIAGNOSIS — R634 Abnormal weight loss: Secondary | ICD-10-CM

## 2024-04-16 DIAGNOSIS — J3089 Other allergic rhinitis: Secondary | ICD-10-CM

## 2024-04-16 MED ORDER — XHANCE 93 MCG/ACT NA EXHU: INHALANT_SUSPENSION | NASAL | 5 refills | Status: AC

## 2024-04-16 NOTE — Patient Instructions (Addendum)
 Rash Monitor symptoms. Follow up with dermatology as scheduled.  Avoid Dupixent .   Post nasal drip Use Atrovent  (ipratropium) 0.03% 1-2 sprays per nostril twice a day as needed for runny nose/drainage.  Sinuses/polyps Continue recommendations as per your ENT.  Continue budesonide sinus rinse twice daily OR  Start Xhance (fluticasone ) nasal spray 1-2 sprays per nostril twice a day as needed for nasal congestion.  Sample given and demonstrated proper use. If this is not covered let us  know.  This will be mailed to you from [facility] pharmacy [phone])  Environmental allergies 2024 skin testing positive to weed and mold. 2025 labs negative. Monitor symptoms.  Continue Singulair  (montelukast ) 10mg  daily at night.  Infections Keep track of infections and antibiotics use.  Breathing Daily controller medication(s): Advair 250mcg 1 puff twice a day and rinse mouth after each use.  May use albuterol  rescue inhaler 2 puffs every 4 to 6 hours as needed for shortness of breath, chest tightness, coughing, and wheezing. May use albuterol  rescue inhaler 2 puffs 5 to 15 minutes prior to strenuous physical activities. Monitor frequency of use - if you need to use it more than twice per week on a consistent basis let us  know.  Breathing control goals:  Full participation in all desired activities (may need albuterol  before activity) Albuterol  use two times or less a week on average (not counting use with activity) Cough interfering with sleep two times or less a month Oral steroids no more than once a year No hospitalizations   Weight loss Monitor and please follow up with your PCP regarding this.   Follow up in 6 months or sooner if needed.

## 2024-04-17 ENCOUNTER — Ambulatory Visit: Admitting: Dermatology

## 2024-04-17 ENCOUNTER — Encounter: Payer: Self-pay | Admitting: Dermatology

## 2024-04-17 VITALS — BP 116/73 | HR 81

## 2024-04-17 DIAGNOSIS — M793 Panniculitis, unspecified: Secondary | ICD-10-CM | POA: Diagnosis not present

## 2024-04-17 DIAGNOSIS — T148XXA Other injury of unspecified body region, initial encounter: Secondary | ICD-10-CM

## 2024-04-17 NOTE — Progress Notes (Signed)
   Follow-Up Visit   Subjective  Danissa Rundle is a 63 y.o. 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  Pt had bx at last visit and present for SRM and to review diagnosis  Patient present today for follow up visit for Erythema Nodosum. Patient was last evaluated on 04/02/24. At this visit patient was prescribed Prednisone . Patient reports she finished prednisone  yesterday. Patient reports sxs are Improving. Patient denies medication changes.  The following portions of the chart were reviewed this encounter and updated as appropriate: medications, allergies, medical history  Review of Systems:  No other skin or systemic complaints except as noted in HPI or Assessment and Plan.  Objective  Well appearing patient in no apparent distress; mood and affect are within normal limits.  A focused examination was performed of the following areas: B/L LE  REPORT OF DERMATOPATHOLOGY FINAL DIAGNOSIS and MICROSCOPIC DESCRIPTION Diagnosis Skin , left lower leg - posterior PANNICULITIS, FAVOR TRAUMATIC/FACTITIAL PANNICULITIS, SEE DESCRIPTION Microscopic Description Stasis-like change is present in the background, and in the subcutis is a chronic lymphohistiocytic infiltrate with foam cells, irregularity of the adipocytes, and foreign body-type giant cells peripherally. 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 While some features overlap with erythema nodosum, the degree of inflammation with foreign body giant cell reaction is more consistent with a traumatic/factitial process. A rare lipomembrane is seen but the features are not diagnostic of lipodermatosclerosis. Polarization in these sections is negative for foreign material. In addition, PAS staining is negative for fungal hyphae. (   Relevant exam findings are noted in the Assessment and Plan.    Assessment & Plan   1. Traumatic Panniculitis with secondary bruising -  Assessment:  Patient developed nodular skin lesions and bruising following yard work in March. Biopsy results revealed foam cells and foreign body granulomas, suggesting an external/truamatic cause. Full workup ruled out autoimmune conditions. Recent prednisone  treatment reduced inflammation and no new lesions have presented over the past 2 weeks.  Physical examination shows persistent bruising around old lesions.   Pt was reviewed with patient  - Plan:    Apply Aquaphor to biopsy site continuously    Massage around biopsy area to improve blood flow    Start Silagin (containing arnica, bromelain, and vitamin K) twice daily for bruising    Apply topical vitamin C with collagen to accelerate healing    Dietary recommendation: Increase pineapple intake for bromelain    Avoid brushing, dry rubbing, and massages on affected areas    Continue prednisone  as prescribed    Patient education: Consider behaviors and clothing that might be causing the condition    Recommend consultation with vein specialist for visible vein (potential sclerotherapy or radiotherapy)  Follow-up in 6 months to establish potential recurrence pattern.   Return in about 6 months (around 10/18/2024) for Erythema Nodosum F/U.  I, Jetta Ager, am acting as Neurosurgeon for [employer], DO.  Documentation: I have reviewed the above documentation for accuracy and completeness, and I agree with the above.  Louana Roup, DO

## 2024-04-17 NOTE — Patient Instructions (Addendum)
 Date: Thu Apr 17 2024  Hello Lindsey Knight,  Thank you for visiting today. 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 but Pathologist Favored PANNICULITIS, FAVOR TRAUMATIC/FACTITIAL PANNICULITIS,  - Wound Care:   - Keep Aquaphor on the biopsy site at all times   - Massage around the biopsy area to improve blood flow  - Medications:   - Apply Silagin cream twice a day for bruising   - Apply topical vitamin C with collagen to help clear bruising faster  - Lifestyle Changes:   - Eat pineapple to help with bruising (contains bromelain)   - Avoid brushing, dry rubbing, and massages on affected areas   -  Consider behaviors and clothing that might be causing the condition - Follow-up: Schedule a follow-up appointment in 6 months  Please reach out if you have any questions or concerns.  Warm regards,  Dr. Louana Roup, Dermatology             Important Information   Due to recent changes in healthcare laws, you may see results of your pathology and/or laboratory studies on MyChart before the doctors have had a chance to review them. We understand that in some cases there may be results that are confusing or concerning to you. Please understand that not all results are received at the same time and often the doctors may need to interpret multiple results in order to provide you with the best plan of care or course of treatment. Therefore, we ask that you please give us  2 business days to thoroughly review all your results before contacting the office for clarification. Should we see a critical lab result, you will be contacted sooner.     If You Need Anything After Your Visit   If you have any questions or concerns for your doctor, please call our main line at [phone]. If no one answers, please leave a voicemail as directed and we will return your call as soon as possible. Messages left after 4 pm will be answered the following business  day.    You may also send us  a message via MyChart. We typically respond to MyChart messages within 1-2 business days.  For prescription refills, please ask your pharmacy to contact our office. Our fax number is [phone].  If you have an urgent issue when the clinic is closed that cannot wait until the next business day, you can page your doctor at the number below.     Please note that while we do our best to be available for urgent issues outside of office hours, we are not available 24/7.    If you have an urgent issue and are unable to reach us , you may choose to seek medical care at your doctor's office, retail clinic, urgent care center, or emergency room.   If you have a medical emergency, please immediately call 911 or go to the emergency department. In the event of inclement weather, please call our main line at [phone] for an update on the status of any delays or closures.  Dermatology Medication Tips: Please keep the boxes that topical medications come in in order to help keep track of the instructions about where and how to use these. Pharmacies typically print the medication instructions only on the boxes and not directly on the medication tubes.   If your medication is too expensive, please contact our office at [phone] or send us  a message through MyChart.    We are unable  to tell what your co-pay for medications will be in advance as this is different depending on your insurance coverage. However, we may be able to find a substitute medication at lower cost or fill out paperwork to get insurance to cover a needed medication.    If a prior authorization is required to get your medication covered by your insurance company, please allow us  1-2 business days to complete this process.   Drug prices often vary depending on where the prescription is filled and some pharmacies may offer cheaper prices.   The website www.goodrx.com contains coupons for medications  through different pharmacies. The prices here do not account for what the cost may be with help from insurance (it may be cheaper with your insurance), but the website can give you the price if you did not use any insurance.  - You can print the associated coupon and take it with your prescription to the pharmacy.  - You may also stop by our office during regular business hours and pick up a GoodRx coupon card.  - If you need your prescription sent electronically to a different pharmacy, notify our office through [facility] or by phone at [phone]

## 2024-04-18 ENCOUNTER — Telehealth: Payer: Self-pay

## 2024-04-18 ENCOUNTER — Other Ambulatory Visit (HOSPITAL_COMMUNITY): Payer: Self-pay

## 2024-04-18 NOTE — Telephone Encounter (Signed)
*  Asthma/Allergy   Pharmacy Patient Advocate Encounter  Received notification from [facility] that Prior Authorization for Xhance  93MCG/ACT exhaler suspension  has been APPROVED from 04/18/2024 to 04/18/2025. Ran test claim, Copay is $0.00. This test claim was processed through [facility]- copay amounts may vary at other pharmacies due to pharmacy/plan contracts, or as the patient moves through the different stages of their insurance plan.   PA #/Case ID/Reference #: [id number]

## 2024-04-23 ENCOUNTER — Encounter: Payer: Self-pay | Admitting: Allergy

## 2024-05-06 ENCOUNTER — Encounter: Payer: Self-pay | Admitting: Radiology

## 2024-05-06 ENCOUNTER — Ambulatory Visit (INDEPENDENT_AMBULATORY_CARE_PROVIDER_SITE_OTHER): Payer: Self-pay | Admitting: Radiology

## 2024-05-06 VITALS — BP 120/62 | HR 90 | Ht 63.5 in | Wt 141.0 lb

## 2024-05-06 DIAGNOSIS — N958 Other specified menopausal and perimenopausal disorders: Secondary | ICD-10-CM

## 2024-05-06 DIAGNOSIS — Z1331 Encounter for screening for depression: Secondary | ICD-10-CM

## 2024-05-06 DIAGNOSIS — M810 Age-related osteoporosis without current pathological fracture: Secondary | ICD-10-CM

## 2024-05-06 DIAGNOSIS — Z01419 Encounter for gynecological examination (general) (routine) without abnormal findings: Secondary | ICD-10-CM

## 2024-05-06 MED ORDER — ESTRADIOL 0.1 MG/GM VA CREA: 1.0000 g | TOPICAL_CREAM | VAGINAL | 3 refills | Status: AC

## 2024-05-06 NOTE — Patient Instructions (Signed)
 Preventive Care 16-64 Years Old, Female  Preventive care refers to lifestyle choices and visits with your health care provider that can promote health and wellness. Preventive care visits are also called wellness exams.  What can I expect for my preventive care visit?  Counseling  Your health care provider may ask you questions about your:  Medical history, including:  Past medical problems.  Family medical history.  Pregnancy history.  Current health, including:  Menstrual cycle.  Method of birth control.  Emotional well-being.  Home life and relationship well-being.  Sexual activity and sexual health.  Lifestyle, including:  Alcohol, nicotine or tobacco, and drug use.  Access to firearms.  Diet, exercise, and sleep habits.  Work and work Astronomer.  Sunscreen use.  Safety issues such as seatbelt and bike helmet use.  Physical exam  Your health care provider will check your:  Height and weight. These may be used to calculate your BMI (body mass index). BMI is a measurement that tells if you are at a healthy weight.  Waist circumference. This measures the distance around your waistline. This measurement also tells if you are at a healthy weight and may help predict your risk of certain diseases, such as type 2 diabetes and high blood pressure.  Heart rate and blood pressure.  Body temperature.  Skin for abnormal spots.  What immunizations do I need?    Vaccines are usually given at various ages, according to a schedule. Your health care provider will recommend vaccines for you based on your age, medical history, and lifestyle or other factors, such as travel or where you work.  What tests do I need?  Screening  Your health care provider may recommend screening tests for certain conditions. This may include:  Lipid and cholesterol levels.  Diabetes screening. This is done by checking your blood sugar (glucose) after you have not eaten for a while (fasting).  Pelvic exam and Pap test.  Hepatitis B test.  Hepatitis C  test.  HIV (human immunodeficiency virus) test.  STI (sexually transmitted infection) testing, if you are at risk.  Lung cancer screening.  Colorectal cancer screening.  Mammogram. Talk with your health care provider about when you should start having regular mammograms. This may depend on whether you have a family history of breast cancer.  BRCA-related cancer screening. This may be done if you have a family history of breast, ovarian, tubal, or peritoneal cancers.  Bone density scan. This is done to screen for osteoporosis.  Talk with your health care provider about your test results, treatment options, and if necessary, the need for more tests.  Follow these instructions at home:  Eating and drinking    Eat a diet that includes fresh fruits and vegetables, whole grains, lean protein, and low-fat dairy products.  Take vitamin and mineral supplements as recommended by your health care provider.  Do not drink alcohol if:  Your health care provider tells you not to drink.  You are pregnant, may be pregnant, or are planning to become pregnant.  If you drink alcohol:  Limit how much you have to 0-1 drink a day.  Know how much alcohol is in your drink. In the [place] one drink equals one 12 oz bottle of beer (355 mL), one 5 oz glass of wine (148 mL), or one 1 oz glass of hard liquor (44 mL).  Lifestyle  Brush your teeth every morning and night with fluoride toothpaste. Floss one time each day.  Exercise for at least  30 minutes 5 or more days each week.  Do not use any products that contain nicotine or tobacco. These products include cigarettes, chewing tobacco, and vaping devices, such as e-cigarettes. If you need help quitting, ask your health care provider.  Do not use drugs.  If you are sexually active, practice safe sex. Use a condom or other form of protection to prevent STIs.  If you do not wish to become pregnant, use a form of birth control. If you plan to become pregnant, see your health care provider for a  prepregnancy visit.  Take aspirin only as told by your health care provider. Make sure that you understand how much to take and what form to take. Work with your health care provider to find out whether it is safe and beneficial for you to take aspirin daily.  Find healthy ways to manage stress, such as:  Meditation, yoga, or listening to music.  Journaling.  Talking to a trusted person.  Spending time with friends and family.  Minimize exposure to UV radiation to reduce your risk of skin cancer.  Safety  Always wear your seat belt while driving or riding in a vehicle.  Do not drive:  If you have been drinking alcohol. Do not ride with someone who has been drinking.  When you are tired or distracted.  While texting.  If you have been using any mind-altering substances or drugs.  Wear a helmet and other protective equipment during sports activities.  If you have firearms in your house, make sure you follow all gun safety procedures.  Seek help if you have been physically or sexually abused.  What's next?  Visit your health care provider once a year for an annual wellness visit.  Ask your health care provider how often you should have your eyes and teeth checked.  Stay up to date on all vaccines.  This information is not intended to replace advice given to you by your health care provider. Make sure you discuss any questions you have with your health care provider.  Document Revised: 05/04/2021 Document Reviewed: 05/04/2021  Elsevier Patient Education  2024 [employer].

## 2024-05-06 NOTE — Progress Notes (Signed)
 Lindsey Knight [birth year] [id number]   History: Postmenopausal 63 y.o. presents for annual exam. Has been sick with respiratory/sinus issues for the past year. Using estrace  1-2x/week, not sexually active. Fosamax  for osteoporosis, not happy with weekly dosing and GI side effects, Interested in Prolia.   Gynecologic History Postmenopausal Last Pap: 4/23. Results were: normal, HPV negative Last mammogram: 9/24. Results were: normal Last colonoscopy: 3/23 DEXA:5/23 osteoporosis, on fosamax    Obstetric History OB History  Gravida Para Term Preterm AB Living  3 2   1 2   SAB IAB Ectopic Multiple Live Births   1   2    # Outcome Date GA Lbr Len/2nd Weight Sex Type Anes PTL Lv  3 IAB           2 Para           1 Para                05/06/2024   10:24 AM 08/20/2023    9:48 AM 06/20/2023    8:04 AM  Depression screen PHQ 2/9  Decreased Interest 0 0 0  Down, Depressed, Hopeless 0 0 0  PHQ - 2 Score 0 0 0  Altered sleeping  2   Tired, decreased energy  2   Change in appetite  0   Feeling bad or failure about yourself   0   Trouble concentrating  1   Moving slowly or fidgety/restless  0   Suicidal thoughts  0   PHQ-9 Score  5   Difficult doing work/chores  Somewhat difficult      The following portions of the patient's history were reviewed and updated as appropriate: allergies, current medications, past family history, past medical history, past social history, past surgical history, and problem list.  Review of Systems Pertinent items noted in HPI and remainder of comprehensive ROS otherwise negative.  Past medical history, past surgical history, family history and social history were all reviewed and documented in the EPIC chart.  Exam:  Vitals:   05/06/24 1024  BP: 120/62  Pulse: 90  SpO2: 97%  Weight: 141 lb (64 kg)  Height: 5' 3.5 (1.613 m)   Body mass index is 24.59 kg/m.  General appearance:  Normal Thyroid :  Symmetrical, normal in size, without palpable  masses or nodularity. Respiratory  Auscultation:  Clear without wheezing or rhonchi Cardiovascular  Auscultation:  Regular rate, without rubs, murmurs or gallops  Edema/varicosities:  Not grossly evident Abdominal  Soft,nontender, without masses, guarding or rebound.  Liver/spleen:  No organomegaly noted  Hernia:  None appreciated  Skin  Inspection:  Grossly normal Breasts: Examined lying and sitting.   Right: Without masses, retractions, nipple discharge or axillary adenopathy.   Left: Without masses, retractions, nipple discharge or axillary adenopathy. Genitourinary   Inguinal/mons:  Normal without inguinal adenopathy  External genitalia:  Normal appearing vulva with no masses, tenderness, or lesions  BUS/Urethra/Skene's glands:  Normal  Vagina:  Normal appearing with normal color and discharge, no lesions. Atrophy: mild   Cervix:  Normal appearing without discharge or lesions  Uterus:  Normal in size, shape and contour.  Midline and mobile, nontender  Adnexa/parametria:     Rt: Normal in size, without masses or tenderness.   Lt: Normal in size, without masses or tenderness.  Anus and perineum: Normal    Ellis Guys, CMA present for exam  Assessment/Plan:   1. Well woman exam with routine gynecological exam (Primary) Pap 2028  2. Osteoporosis of lumbar spine -  DG Bone Density; Future - Will check results before changing to Prolia  3. Depression screening negative  4. Genitourinary syndrome of menopause - estradiol  (ESTRACE  VAGINAL) 0.1 MG/GM vaginal cream; Place 1 g vaginally 3 (three) times a week.  Dispense: 42.5 g; Refill: 3   Discussed SBE, colonoscopy and DEXA screening as directed. Recommend of exercise weekly, including weight bearing exercise. Encouraged the use of seatbelts and sunscreen.  Return in 1 year for annual or sooner prn.  Alcie Runions B WHNP-BC, 10:35 AM 05/06/2024

## 2024-05-20 ENCOUNTER — Other Ambulatory Visit: Payer: Self-pay | Admitting: Physician Assistant

## 2024-05-20 ENCOUNTER — Other Ambulatory Visit: Payer: Self-pay | Admitting: Radiology

## 2024-05-20 DIAGNOSIS — M810 Age-related osteoporosis without current pathological fracture: Secondary | ICD-10-CM

## 2024-05-20 NOTE — Telephone Encounter (Signed)
 Med refill request: alendronate  70 mg Last AEX: 05/06/24 Next AEX: none scheduled DEXA: 07/13/24 Last MMG (if hormonal med) n/a Refill authorized: Please Advise?

## 2024-05-20 NOTE — Telephone Encounter (Signed)
 Prolia ordered at last visit.

## 2024-05-22 ENCOUNTER — Ambulatory Visit: Payer: Self-pay | Admitting: Pulmonary Disease

## 2024-06-23 ENCOUNTER — Ambulatory Visit (HOSPITAL_BASED_OUTPATIENT_CLINIC_OR_DEPARTMENT_OTHER)
Admission: RE | Admit: 2024-06-23 | Discharge: 2024-06-23 | Disposition: A | Discharge status: Home / Self Care | Source: Ambulatory Visit | Attending: Radiology | Admitting: Radiology

## 2024-06-23 DIAGNOSIS — M810 Age-related osteoporosis without current pathological fracture: Secondary | ICD-10-CM | POA: Diagnosis not present

## 2024-06-23 DIAGNOSIS — Z780 Asymptomatic menopausal state: Secondary | ICD-10-CM | POA: Diagnosis not present

## 2024-06-24 ENCOUNTER — Ambulatory Visit: Payer: Self-pay | Admitting: Radiology

## 2024-06-30 NOTE — Telephone Encounter (Signed)
 Ideally we should try to get Evenity covered, if not Prolia is the next option.

## 2024-07-09 ENCOUNTER — Encounter: Payer: Self-pay | Admitting: Pulmonary Disease

## 2024-07-09 ENCOUNTER — Ambulatory Visit (INDEPENDENT_AMBULATORY_CARE_PROVIDER_SITE_OTHER): Admitting: Pulmonary Disease

## 2024-07-09 VITALS — BP 149/75 | HR 60 | Ht 63.0 in | Wt 143.0 lb

## 2024-07-09 DIAGNOSIS — J324 Chronic pansinusitis: Secondary | ICD-10-CM | POA: Diagnosis not present

## 2024-07-09 DIAGNOSIS — R590 Localized enlarged lymph nodes: Secondary | ICD-10-CM | POA: Diagnosis not present

## 2024-07-09 DIAGNOSIS — J4520 Mild intermittent asthma, uncomplicated: Secondary | ICD-10-CM | POA: Diagnosis not present

## 2024-07-09 NOTE — Progress Notes (Signed)
 Synopsis: Referred in February 2023 for abnormal chest imaging by Lucie Buttner, PA  Subjective:   PATIENT ID: Lindsey Knight GENDER: female DOB: 1961/11/07, MRN: [id number]  HPI  Chief Complaint  Patient presents with   Medical Management of Chronic Issues    Pt states been well issues seem to have stop, had reactions    Lindsey Knight is a 63 year old woman, never smoker with history of asthma who returns to pulmonary clinic for asthma.   Her respiratory symptoms have been stable since discontinuing Dupixent . She uses Xhance  (fluticasone ) nasal spray and Advair, and feels better overall. She is concerned about the potential return of sinus polyps. Atrovent  nasal spray is also part of her regimen, with no current sinus drainage or pressure.  She has experienced unintentional weight loss of about 20 pounds due to lack of appetite and not feeling well. There are no current night sweats, fevers, or chills, although these symptoms were present with past sinus infections. She has had prominent hilar and mediastinal lymph nodes int he past, but CT from 03/2024 show stable size/appearance of these lymph nodes. No biopsies of lymph nodes have been performed.  She volunteers at a community garden, which involves physical activity.. This activity contributes to her physical and social well-being.  OV 07/09/24 She has been seeing ENT at [facility] and [facility] for her chronic pansinusitis. 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 She is awaiting evaluation by Dermatology. Leg lesions have improved somewhat. Her respiratory and sinus symptoms improved while on dupixent .  There is possible concern for aspergillus sinus infection given her on going sinusitis issues with elevated IgE and eosinophil levels. No aspergillus has been cultured from her sinus procedures and no antibody levels checked yet.   She continues on adviar  inhaler for her asthma.   OV 05/18/23 She reports sinus drainage with post nasal drip and some sinus pressure over recent weeks. She has significant cough that can keep her up at night. She is using advair 250-50mcg 1 puff twice daily and as needed albuterol . She is using flonase 1 spray per nostril daily. She continues to have reflux symptoms despite famotidine 20mg  twice daily.   OV 10/09/22 She reports increased cough over the past 2 months. She describes an event where she was bending over in the community garden and possibly had a significant reflux episode. She denies any viral illnesses recently.   She saw GI 08/01/22 with plan to reduce PPI dosing then transition to famotidine for 2 weeks then to use as needed. She reports the famotidine did not help, she reports burning sensation in her chest and throat. She then started omeprazole OTC which seems to have reduced her reflux symptoms.   She has advair 250-25mcg 1 puff twice daily that she has been using as needed. She has albuterol  as needed as well. She has not felt much relief.   She has not been on steroids or antibiotics.   OV 03/06/22 Chest x-ray today shows clearing of the right upper lobe infiltrates. Will await final radiology read.   She is using advair 100-50mcg 1 puff twice daily as needed along with daily allegra and montelukast  for her asthma. No night time awakenings. No issues with spring allergies.  She has been feeling well since last visit. He cough has significantly decreased. She recently had EGD and C-scope and started on PPI therapy for GERD.   OV  01/03/22 Patient reports having respiratory illness in mid-December 2022 and was treated with Zpak on 12/13. PCP note from 11/08/2021 reviewed. Chest x-ray on 12/30 showed increased lung markings with mild right mid lung and left basilar linear scaring or atelectasis. She was also noted to have increased platelet count. She had CT Chest on 11/29/21 that showed mediastinal,  axillary and hilar lymphadenopathy along with patchy ground-glass and adjacent focal clustered nodularity in the right upper lobe and adjacent band like atelectasis/scarring at the junction of the right upper lobe.   Patient reports resolution of her infectious symptoms from December and is feeling at her baseline. She will occasionally have some right sided discomfort but it is overall improved from December. She notices the pain when laying down or moving when she sleeps. She denies any shortness of breath, fevers, chills or night sweats. She denies hemoptysis. She does have joint aches in general which include her knees, hips and occasional hand stiffness in the mornings.  She has history of asthma and mainly has issues with reactive airways disease when triggered by viral infections and uses inhalers as needed. She denies issues with seasonal allergies. She had sinus surgery in the past with significant reduction in frequency of sinus infections.   She is a never smoker. She is a retired Engineer, civil (consulting). She has a sister with crohn's disease and her mother had colon cancer.   Past Medical History:  Diagnosis Date   Allergy  Child   Anemia    Arthritis    Asthma    Eczema    GERD (gastroesophageal reflux disease) 10/22   Hiatal hernia    Osteoporosis    Torn ligament    pt reports torn ligament in right ankle, use caution when turning   Ulcer 6/23   Vaginal delivery    x 2, 1985, 1990     Family History  Problem Relation Age of Onset   Colon cancer Mother    Cancer Mother    Hearing loss Mother    Heart failure Father    Asthma Father    COPD Father    Hearing loss Father    Crohn's disease Sister    Pancreatic cancer Other    Ovarian cancer Other    Uterine cancer Other    Allergic rhinitis Grandson    Food Allergy  Grandson    Asthma Daughter      Social History   Socioeconomic History   Marital status: Married    Spouse name: Not on file   Number of children: Not on file    Years of education: Not on file   Highest education level: Bachelor's degree (e.g., BA, AB, BS)  Occupational History   Not on file  Tobacco Use   Smoking status: Never    Passive exposure: Never   Smokeless tobacco: Never  Vaping Use   Vaping status: Never Used  Substance and Sexual Activity   Alcohol use: Yes    Comment: socially   Drug use: Not Currently    Types: Marijuana    Comment: THC for sleep   Sexual activity: Not Currently    Partners: Male    Birth control/protection: Post-menopausal    Comment: menarche 63yo, sexual debut 63yo  Other Topics Concern   Not on file  Social History Narrative   Retired Engineer, civil (consulting)   Married   Has two local daughters and grandchildren   Social Drivers of Corporate investment banker Strain: Low Risk  (02/03/2024)   Overall Financial  Resource Strain (CARDIA)    Difficulty of Paying Living Expenses: Not hard at all  Food Insecurity: No Food Insecurity (02/03/2024)   Hunger Vital Sign    Worried About Running Out of Food in the Last Year: Never true    Ran Out of Food in the Last Year: Never true  Transportation Needs: No Transportation Needs (02/03/2024)   PRAPARE - Administrator, Civil Service (Medical): No    Lack of Transportation (Non-Medical): No  Physical Activity: Insufficiently Active (02/03/2024)   Exercise Vital Sign    Days of Exercise per Week: 3 days    Minutes of Exercise per Session: 40 min  Stress: No Stress Concern Present (02/03/2024)   [employer] of Occupational Health - Occupational Stress Questionnaire    Feeling of Stress : Only a little  Social Connections: Moderately Integrated (02/03/2024)   Social Connection and Isolation Panel    Frequency of Communication with Friends and Family: More than three times a week    Frequency of Social Gatherings with Friends and Family: Twice a week    Attends Religious Services: Never    Database administrator or Organizations: Yes    Attends Museum/gallery exhibitions officer: More than 4 times per year    Marital Status: Married  Catering manager Violence: Not on file     Allergies  Allergen Reactions   Other Hives    Adhesives   Dupixent  [Dupilumab ]     Erythema nodosum?   Tape Rash     Outpatient Medications Prior to Visit  Medication Sig Dispense Refill   Acetaminophen  (TYLENOL  PO) Take by mouth.     albuterol  (VENTOLIN  HFA) 108 (90 Base) MCG/ACT inhaler Inhale 2 puffs into the lungs every 4 (four) hours as needed for wheezing or shortness of breath (coughing fits). 18 g 1   alendronate  (FOSAMAX ) 70 MG tablet Take 1 tablet (70 mg total) by mouth every 7 (seven) days. Take with a full glass of water on an empty stomach. 12 tablet 4   benzonatate  (TESSALON ) 100 MG capsule Take 1 capsule (100 mg total) by mouth 3 (three) times daily as needed. 30 capsule 2   budesonide (PULMICORT) 0.25 MG/2ML nebulizer solution Take 0.25 mg by nebulization 2 (two) times daily. Nasal rinse     Cetirizine HCl (ZYRTEC PO) Take by mouth.     Cholecalciferol (VITAMIN D3 ADULT GUMMIES PO)      clobetasol  ointment (TEMOVATE ) 0.05 % Apply 1 Application topically 2 (two) times daily. Use for two weeks at a time. 30 g 0   Cyanocobalamin  (VITAMIN B-12 PO) Take 1,000 mcg by mouth daily in the afternoon.     estradiol  (ESTRACE  VAGINAL) 0.1 MG/GM vaginal cream Place 1 g vaginally 3 (three) times a week. 42.5 g 3   famotidine (PEPCID) 20 MG tablet Take 20 mg by mouth daily.     fluconazole  (DIFLUCAN ) 100 MG tablet Take by mouth.     Fluticasone  Propionate (XHANCE ) 93 MCG/ACT EXHU 1-2 sprays per nostril twice a day. 32 mL 5   fluticasone -salmeterol (ADVAIR DISKUS) 250-50 MCG/ACT AEPB Inhale 1 puff into the lungs in the morning and at bedtime. 60 each 11   Ibuprofen (ADVIL PO) Take by mouth.     ipratropium (ATROVENT ) 0.03 % nasal spray Place into both nostrils.     MAGNESIUM GLYCINATE PO      Melatonin 5 MG CHEW Chew by mouth.     montelukast  (SINGULAIR ) 10 MG  tablet TAKE  1 TABLET BY MOUTH EVERY NIGHT AT BEDTIME 90 tablet 3   Multiple Vitamins-Minerals (MULTIVITAMIN ADULT) CHEW      mupirocin  ointment (BACTROBAN ) 2 % Apply 1 Application topically 2 (two) times daily. 30 g 3   traMADol  (ULTRAM ) 50 MG tablet Take 1 tablet (50 mg total) by mouth every 6 (six) hours as needed for moderate pain (pain score 4-6) or severe pain (pain score 7-10) (headache). 30 tablet 0   traZODone  (DESYREL ) 100 MG tablet TAKE 1 TABLET BY MOUTH EVERY NIGHT AT BEDTIME 90 tablet 0   Ascorbic Acid (VITAMIN C CR) 500 MG TBCR Take 1 tablet by mouth every other day.     No facility-administered medications prior to visit.    Review of Systems  Constitutional:  Positive for weight loss. Negative for chills, fever and malaise/fatigue.  HENT:  Negative for congestion, sinus pain and sore throat.   Eyes: Negative.   Respiratory:  Negative for cough, hemoptysis, sputum production, shortness of breath and wheezing.   Cardiovascular:  Negative for chest pain, palpitations, orthopnea, claudication and leg swelling.  Gastrointestinal:  Positive for heartburn. Negative for abdominal pain, nausea and vomiting.  Genitourinary: Negative.   Musculoskeletal:  Negative for myalgias.  Skin:  Negative for rash.  Neurological:  Negative for weakness.  Endo/Heme/Allergies: Negative.   Psychiatric/Behavioral: Negative.      Objective:   Vitals:   07/09/24 0938  BP: (!) 149/75  Pulse: 60  SpO2: 97%  Weight: 143 lb (64.9 kg)  Height: 5' 3 (1.6 m)   Physical Exam Constitutional:      General: She is not in acute distress.    Appearance: She is not ill-appearing.  HENT:     Head: Normocephalic and atraumatic.  Eyes:     General: No scleral icterus.    Conjunctiva/sclera: Conjunctivae normal.  Cardiovascular:     Rate and Rhythm: Normal rate and regular rhythm.     Pulses: Normal pulses.     Heart sounds: Normal heart sounds. No murmur heard. Pulmonary:     Effort: Pulmonary  effort is normal.     Breath sounds: Normal breath sounds. No wheezing, rhonchi or rales.  Musculoskeletal:     Right lower leg: No edema.     Left lower leg: No edema.  Skin:    General: Skin is warm and dry.  Neurological:     Mental Status: She is alert.    CBC    Component Value Date/Time   WBC 7.4 03/26/2024 1150   RBC 4.65 03/26/2024 1150   HGB 14.0 03/26/2024 1150   HCT 40.8 03/26/2024 1150   PLT 383.0 03/26/2024 1150   MCV 87.7 03/26/2024 1150   MCHC 34.3 03/26/2024 1150   RDW 12.6 03/26/2024 1150   LYMPHSABS 2.7 03/26/2024 1150   MONOABS 0.6 03/26/2024 1150   EOSABS 0.1 03/26/2024 1150   BASOSABS 0.0 03/26/2024 1150      Latest Ref Rng & Units 03/26/2024   11:50 AM 02/04/2024    9:51 AM 08/20/2023   10:31 AM  BMP  Glucose 70 - 99 mg/dL 896  885  893   BUN 6 - 23 mg/dL 10  11  10    Creatinine 0.40 - 1.20 mg/dL 9.36  9.33  9.35   Sodium 135 - 145 mEq/L 134  136  136   Potassium 3.5 - 5.1 mEq/L 3.7  3.9  3.9   Chloride 96 - 112 mEq/L 102  104  104   CO2 19 -  32 mEq/L 25  26  26    Calcium 8.4 - 10.5 mg/dL 9.4  9.1  8.9    Chest imaging: CXR 03/06/22 Improved opacities of the right mid lung.  CT Chest 11/29/21 Patchy ground-glass and adjacent focal clustered nodularity in the right upper lobe and adjacent band like atelectasis/scarring at the junction of the right upper middle lobes. This is consistent with an infectious/inflammatory process. Recommend follow-up chest CT in 3 months to ensure resolution.   Multiple prominent mediastinal and axillary lymph nodes with an enlarged right hilar lymph node and left axillary lymph node. These are favored to be reactive, attention recommended on follow-up CT.   Additional 5 mm ground-glass nodular opacity in the left upper lobe, which may or may not be related to the aforementioned infectious/inflammatory process. This can be reassessed on follow-up CT.  CXR 11/18/21 Chronic appearing increased lung markings with  mild mid right lung and left basilar linear scarring and/or atelectasis.  PFT:    Latest Ref Rng & Units 03/03/2024    9:17 AM  PFT Results  FVC-Pre L 2.83   FVC-Predicted Pre % 91   FVC-Post L 2.94   FVC-Predicted Post % 94   Pre FEV1/FVC % % 75   Post FEV1/FCV % % 82   FEV1-Pre L 2.12   FEV1-Predicted Pre % 89   FEV1-Post L 2.40   DLCO uncorrected ml/min/mmHg 19.00   DLCO UNC% % 98   DLCO corrected ml/min/mmHg 19.06   DLCO COR %Predicted % 98   DLVA Predicted % 98   TLC L 4.64   TLC % Predicted % 94   RV % Predicted % 85     Labs:  Path: Peripheral Smear 11/18/21 WBC are unremarkable. The overall findings in this smear are consistent with a reactive thrombocytosis. Clinical correlation is recommended. Anemia with RBCs which appear to be microcytic and hypochromic on smear review. Suggest evaluation for iron deficiency and/or a source of blood loss, if clinically indicated.   Echo:  Heart Catheterization:    Assessment & Plan:   Chronic pansinusitis  Mild intermittent asthma without complication  Discussion: Lindsey Knight is a 63 year old woman, never smoker with history of asthma who returns to pulmonary clinic for cough.   Asthma Cough Chronic, non-productive cough improved with Advair. No significant dyspnea or wheezing. - Continue Advair for cough management.  Chronic sinusitis with nasal polyps - Inflammatory lab panel and aspergillus testing are negative - Consider further sinus surgery if recurrent issues persist.  Hx of mediastinal lymphadenopathy - Check CT PET scan given recent weight loss.  Follow upin 6 months  Dorn Chill, MD [facility] Office: [phone]    Current Outpatient Medications:    Acetaminophen  (TYLENOL  PO), Take by mouth., Disp: , Rfl:    albuterol  (VENTOLIN  HFA) 108 (90 Base) MCG/ACT inhaler, Inhale 2 puffs into the lungs every 4 (four) hours as needed for wheezing or shortness of breath  (coughing fits)., Disp: 18 g, Rfl: 1   alendronate  (FOSAMAX ) 70 MG tablet, Take 1 tablet (70 mg total) by mouth every 7 (seven) days. Take with a full glass of water on an empty stomach., Disp: 12 tablet, Rfl: 4   benzonatate  (TESSALON ) 100 MG capsule, Take 1 capsule (100 mg total) by mouth 3 (three) times daily as needed., Disp: 30 capsule, Rfl: 2   budesonide (PULMICORT) 0.25 MG/2ML nebulizer solution, Take 0.25 mg by nebulization 2 (two) times daily. Nasal rinse, Disp: , Rfl:  Cetirizine HCl (ZYRTEC PO), Take by mouth., Disp: , Rfl:    Cholecalciferol (VITAMIN D3 ADULT GUMMIES PO), , Disp: , Rfl:    clobetasol  ointment (TEMOVATE ) 0.05 %, Apply 1 Application topically 2 (two) times daily. Use for two weeks at a time., Disp: 30 g, Rfl: 0   Cyanocobalamin  (VITAMIN B-12 PO), Take 1,000 mcg by mouth daily in the afternoon., Disp: , Rfl:    estradiol  (ESTRACE  VAGINAL) 0.1 MG/GM vaginal cream, Place 1 g vaginally 3 (three) times a week., Disp: 42.5 g, Rfl: 3   famotidine (PEPCID) 20 MG tablet, Take 20 mg by mouth daily., Disp: , Rfl:    fluconazole  (DIFLUCAN ) 100 MG tablet, Take by mouth., Disp: , Rfl:    Fluticasone  Propionate (XHANCE ) 93 MCG/ACT EXHU, 1-2 sprays per nostril twice a day., Disp: 32 mL, Rfl: 5   fluticasone -salmeterol (ADVAIR DISKUS) 250-50 MCG/ACT AEPB, Inhale 1 puff into the lungs in the morning and at bedtime., Disp: 60 each, Rfl: 11   Ibuprofen (ADVIL PO), Take by mouth., Disp: , Rfl:    ipratropium (ATROVENT ) 0.03 % nasal spray, Place into both nostrils., Disp: , Rfl:    MAGNESIUM GLYCINATE PO, , Disp: , Rfl:    Melatonin 5 MG CHEW, Chew by mouth., Disp: , Rfl:    montelukast  (SINGULAIR ) 10 MG tablet, TAKE 1 TABLET BY MOUTH EVERY NIGHT AT BEDTIME, Disp: 90 tablet, Rfl: 3   Multiple Vitamins-Minerals (MULTIVITAMIN ADULT) CHEW, , Disp: , Rfl:    mupirocin  ointment (BACTROBAN ) 2 %, Apply 1 Application topically 2 (two) times daily., Disp: 30 g, Rfl: 3   traMADol  (ULTRAM ) 50 MG  tablet, Take 1 tablet (50 mg total) by mouth every 6 (six) hours as needed for moderate pain (pain score 4-6) or severe pain (pain score 7-10) (headache)., Disp: 30 tablet, Rfl: 0   traZODone  (DESYREL ) 100 MG tablet, TAKE 1 TABLET BY MOUTH EVERY NIGHT AT BEDTIME, Disp: 90 tablet, Rfl: 0   Ascorbic Acid (VITAMIN C CR) 500 MG TBCR, Take 1 tablet by mouth every other day., Disp: , Rfl:

## 2024-07-09 NOTE — Patient Instructions (Addendum)
 We will check a PET CT scan given your weight loss and history of enlarged lymph nodes in the chest  Continue advair 1 puff twice daily - rinse mouth out after each use  Continue nasal steroid spray per Allergy  team  We give you a call regarding the PET scan results and possible bronchoscopy.  Follow up in 6 months

## 2024-07-13 ENCOUNTER — Other Ambulatory Visit: Payer: Self-pay | Admitting: Pulmonary Disease

## 2024-07-14 ENCOUNTER — Encounter (HOSPITAL_COMMUNITY)
Admission: RE | Admit: 2024-07-14 | Discharge: 2024-07-14 | Disposition: A | Discharge status: Home / Self Care | Source: Ambulatory Visit | Attending: Pulmonary Disease | Admitting: Pulmonary Disease

## 2024-07-14 DIAGNOSIS — R590 Localized enlarged lymph nodes: Secondary | ICD-10-CM | POA: Insufficient documentation

## 2024-07-14 DIAGNOSIS — M47819 Spondylosis without myelopathy or radiculopathy, site unspecified: Secondary | ICD-10-CM | POA: Diagnosis not present

## 2024-07-14 DIAGNOSIS — I517 Cardiomegaly: Secondary | ICD-10-CM | POA: Diagnosis not present

## 2024-07-14 DIAGNOSIS — R591 Generalized enlarged lymph nodes: Secondary | ICD-10-CM | POA: Diagnosis not present

## 2024-07-14 LAB — GLUCOSE, CAPILLARY: Glucose-Capillary: 102 mg/dL — ABNORMAL HIGH (ref 70–99)

## 2024-07-14 MED ORDER — FLUDEOXYGLUCOSE F - 18 (FDG) INJECTION
6.9900 | Freq: Once | INTRAVENOUS | Status: AC
  Administered 2024-07-14: 6.99 via INTRAVENOUS

## 2024-07-18 ENCOUNTER — Encounter: Payer: Self-pay | Admitting: Pulmonary Disease

## 2024-07-24 ENCOUNTER — Other Ambulatory Visit: Payer: Self-pay | Admitting: *Deleted

## 2024-07-24 DIAGNOSIS — M810 Age-related osteoporosis without current pathological fracture: Secondary | ICD-10-CM

## 2024-07-24 MED ORDER — ROMOSOZUMAB-AQQG 105 MG/1.17ML ~~LOC~~ SOSY: 210.0000 mg | PREFILLED_SYRINGE | Freq: Once | SUBCUTANEOUS | Status: AC

## 2024-07-28 DIAGNOSIS — J324 Chronic pansinusitis: Secondary | ICD-10-CM | POA: Diagnosis not present

## 2024-07-28 DIAGNOSIS — J3489 Other specified disorders of nose and nasal sinuses: Secondary | ICD-10-CM | POA: Diagnosis not present

## 2024-07-29 ENCOUNTER — Ambulatory Visit: Payer: Self-pay | Admitting: Pulmonary Disease

## 2024-07-30 NOTE — Telephone Encounter (Signed)
 Do we have an update where we are in the process of approval?

## 2024-07-31 ENCOUNTER — Other Ambulatory Visit: Payer: Self-pay | Admitting: Radiology

## 2024-07-31 DIAGNOSIS — M810 Age-related osteoporosis without current pathological fracture: Secondary | ICD-10-CM

## 2024-07-31 MED ORDER — ALENDRONATE SODIUM 70 MG PO TABS: 70.0000 mg | ORAL_TABLET | ORAL | 0 refills | Status: DC

## 2024-08-11 ENCOUNTER — Telehealth: Payer: Self-pay | Admitting: *Deleted

## 2024-08-11 NOTE — Telephone Encounter (Signed)
 Please advise. The request for coverage of [employer]  is denied.  The member must have one of the following:  A history of previous osteoporosis related fracture A pre- treatment T score of less than or equal to -3 at the hip or femoral neck  A pre-treatment T score of less than or equal to -2.5 at the hip or femoral neck AND one of the following The member has tried and failed or cannot take bisphosphonate's The member has tried and failed other injectable osteoporosis therapies  In this case, the member does not meet one of the above criteria.    Routing to provider to review and advise.

## 2024-08-12 NOTE — Telephone Encounter (Signed)
 Can we switch to Prolia?

## 2024-08-13 ENCOUNTER — Encounter: Payer: Self-pay | Admitting: *Deleted

## 2024-08-13 NOTE — Telephone Encounter (Signed)
 Yes, she has BCBS so will submit insurance for benefits verification of Prolia.

## 2024-08-15 ENCOUNTER — Other Ambulatory Visit: Payer: Self-pay | Admitting: Physician Assistant

## 2024-08-20 DIAGNOSIS — H43813 Vitreous degeneration, bilateral: Secondary | ICD-10-CM | POA: Diagnosis not present

## 2024-08-20 DIAGNOSIS — H25813 Combined forms of age-related cataract, bilateral: Secondary | ICD-10-CM | POA: Diagnosis not present

## 2024-08-20 DIAGNOSIS — H52223 Regular astigmatism, bilateral: Secondary | ICD-10-CM | POA: Diagnosis not present

## 2024-08-21 ENCOUNTER — Ambulatory Visit (INDEPENDENT_AMBULATORY_CARE_PROVIDER_SITE_OTHER): Admitting: Physician Assistant

## 2024-08-21 ENCOUNTER — Encounter: Payer: Self-pay | Admitting: Physician Assistant

## 2024-08-21 VITALS — BP 122/62 | HR 82 | Temp 98.0°F | Ht 63.0 in | Wt 140.0 lb

## 2024-08-21 DIAGNOSIS — R7309 Other abnormal glucose: Secondary | ICD-10-CM | POA: Diagnosis not present

## 2024-08-21 DIAGNOSIS — E559 Vitamin D deficiency, unspecified: Secondary | ICD-10-CM

## 2024-08-21 DIAGNOSIS — Z0000 Encounter for general adult medical examination without abnormal findings: Secondary | ICD-10-CM | POA: Diagnosis not present

## 2024-08-21 DIAGNOSIS — R351 Nocturia: Secondary | ICD-10-CM | POA: Diagnosis not present

## 2024-08-21 DIAGNOSIS — M810 Age-related osteoporosis without current pathological fracture: Secondary | ICD-10-CM | POA: Diagnosis not present

## 2024-08-21 DIAGNOSIS — E785 Hyperlipidemia, unspecified: Secondary | ICD-10-CM

## 2024-08-21 DIAGNOSIS — Z23 Encounter for immunization: Secondary | ICD-10-CM

## 2024-08-21 DIAGNOSIS — R634 Abnormal weight loss: Secondary | ICD-10-CM

## 2024-08-21 DIAGNOSIS — J3489 Other specified disorders of nose and nasal sinuses: Secondary | ICD-10-CM

## 2024-08-21 LAB — URINALYSIS, ROUTINE W REFLEX MICROSCOPIC
Bilirubin Urine: NEGATIVE
Hgb urine dipstick: NEGATIVE
Ketones, ur: NEGATIVE
Leukocytes,Ua: NEGATIVE
Nitrite: NEGATIVE
RBC / HPF: NONE SEEN (ref 0–?)
Specific Gravity, Urine: 1.015 (ref 1.000–1.030)
Total Protein, Urine: NEGATIVE
Urine Glucose: NEGATIVE
Urobilinogen, UA: 0.2 (ref 0.0–1.0)
WBC, UA: NONE SEEN (ref 0–?)
pH: 7.5 (ref 5.0–8.0)

## 2024-08-21 LAB — CBC WITH DIFFERENTIAL/PLATELET
Basophils Absolute: 0 K/uL (ref 0.0–0.1)
Basophils Relative: 0.5 % (ref 0.0–3.0)
Eosinophils Absolute: 0.1 K/uL (ref 0.0–0.7)
Eosinophils Relative: 1.9 % (ref 0.0–5.0)
HCT: 40.2 % (ref 36.0–46.0)
Hemoglobin: 14 g/dL (ref 12.0–15.0)
Lymphocytes Relative: 37.3 % (ref 12.0–46.0)
Lymphs Abs: 2.2 K/uL (ref 0.7–4.0)
MCHC: 34.7 g/dL (ref 30.0–36.0)
MCV: 85.6 fl (ref 78.0–100.0)
Monocytes Absolute: 0.5 K/uL (ref 0.1–1.0)
Monocytes Relative: 7.9 % (ref 3.0–12.0)
Neutro Abs: 3 K/uL (ref 1.4–7.7)
Neutrophils Relative %: 52.4 % (ref 43.0–77.0)
Platelets: 300 K/uL (ref 150.0–400.0)
RBC: 4.69 Mil/uL (ref 3.87–5.11)
RDW: 12.7 % (ref 11.5–15.5)
WBC: 5.8 K/uL (ref 4.0–10.5)

## 2024-08-21 LAB — COMPREHENSIVE METABOLIC PANEL WITH GFR
ALT: 14 U/L (ref 0–35)
AST: 21 U/L (ref 0–37)
Albumin: 4.2 g/dL (ref 3.5–5.2)
Alkaline Phosphatase: 56 U/L (ref 39–117)
BUN: 10 mg/dL (ref 6–23)
CO2: 27 meq/L (ref 19–32)
Calcium: 9.5 mg/dL (ref 8.4–10.5)
Chloride: 104 meq/L (ref 96–112)
Creatinine, Ser: 0.64 mg/dL (ref 0.40–1.20)
GFR: 93.48 mL/min (ref >60.00)
Glucose, Bld: 111 mg/dL — ABNORMAL HIGH (ref 70–99)
Potassium: 4 meq/L (ref 3.5–5.1)
Sodium: 138 meq/L (ref 135–145)
Total Bilirubin: 0.5 mg/dL (ref 0.2–1.2)
Total Protein: 7 g/dL (ref 6.0–8.3)

## 2024-08-21 LAB — LIPID PANEL
Cholesterol: 181 mg/dL (ref 0–200)
HDL: 53.7 mg/dL (ref >39.00)
LDL Cholesterol: 108 mg/dL — ABNORMAL HIGH (ref 0–99)
NonHDL: 127.23
Total CHOL/HDL Ratio: 3
Triglycerides: 94 mg/dL (ref 0.0–149.0)
VLDL: 18.8 mg/dL (ref 0.0–40.0)

## 2024-08-21 LAB — HEMOGLOBIN A1C: Hgb A1c MFr Bld: 5.8 % (ref 4.6–6.5)

## 2024-08-21 LAB — TSH: TSH: 1.49 u[IU]/mL (ref 0.35–5.50)

## 2024-08-21 LAB — VITAMIN D 25 HYDROXY (VIT D DEFICIENCY, FRACTURES): VITD: 73.67 ng/mL (ref 30.00–100.00)

## 2024-08-21 NOTE — Progress Notes (Signed)
 Subjective:    Lindsey Knight is a 63 y.o. female and is here for a comprehensive physical exam.  HPI  Health Maintenance Due  Topic Date Due   Pneumococcal Vaccine: 50+ Years (2 of 2 - PPSV23, PCV20, or PCV21) 10/25/2012   Influenza Vaccine  06/20/2024   Discussed the use of AI scribe software for clinical note transcription with the patient, who gave verbal consent to proceed.  History of Present Illness   Lindsey Knight is a 63 year old female who presents with concerns about her health and recent weight loss.  She is experiencing significant stress due to recent family events, including her brother's death and her husband's surgery, which may contribute to her health concerns. She is responsible for family care, including her father and sister-in-Spiering, which adds to her stress.  She has unintentional weight loss, likely due to a busy schedule leading to skipped meals. She has not been exercising regularly due to time constraints and previous ankle issues. Sleep disturbances include nocturia and leg cramps, which have improved with increased banana consumption.  She is on Fosamax  for osteoporosis, but a lapse occurred due to insurance issues when switching to Prolia.  She has a history of sinus issues, with a recent PET scan showing minimally hypermetabolic lymph nodes, unchanged from previous imaging. A past CT scan also highlighted sinus concerns. Her sinus surgery was postponed due to her brother's funeral.       Health Maintenance: Immunizations -- getting flu and pneumonia today Colonoscopy -- UpToDate; due in 2028 Mammogram -- scheduled PAP -- UpToDate; completed in 2023 Bone Density -- UpToDate; completed in Aug 2025 and confirmed osteoporosis Diet -- sometimes forgot to eat  Exercise -- none  Sleep habits -- issues with nocturia Mood -- no major concerns  UTD with dentist? - yes UTD with eye doctor? - yes  Weight history: Wt Readings from Last 10 Encounters:   08/21/24 140 lb (63.5 kg)  07/09/24 143 lb (64.9 kg)  05/06/24 141 lb (64 kg)  04/16/24 142 lb 1.6 oz (64.5 kg)  03/26/24 142 lb (64.4 kg)  03/03/24 147 lb 3.2 oz (66.8 kg)  02/18/24 149 lb (67.6 kg)  02/07/24 152 lb (68.9 kg)  02/04/24 151 lb (68.5 kg)  12/24/23 158 lb 1.6 oz (71.7 kg)   Body mass index is 24.8 kg/m. No LMP recorded. Patient is postmenopausal.  Alcohol use:  reports current alcohol use of about 1.0 standard drink of alcohol per week.  Tobacco use:  Tobacco Use: Low Risk  (08/21/2024)   Patient History    Smoking Tobacco Use: Never    Smokeless Tobacco Use: Never    Passive Exposure: Never   Eligible for lung cancer screening? no     05/06/2024   10:24 AM  Depression screen PHQ 2/9  Decreased Interest 0  Down, Depressed, Hopeless 0  PHQ - 2 Score 0     Other providers/specialists: Patient Care Team: Job Lukes, [place] as PCP - General (Physician Assistant) Ginette Shasta NOVAK, NP as Nurse Practitioner (Radiology)    PMHx, SurgHx, SocialHx, Medications, and Allergies were reviewed in the Visit Navigator and updated as appropriate.   Past Medical History:  Diagnosis Date   Allergy  Child   Anemia    Arthritis    Asthma    Eczema    GERD (gastroesophageal reflux disease) 10/22   Hiatal hernia    Neuromuscular disorder (HCC)    Osteoporosis    Torn ligament    pt  reports torn ligament in right ankle, use caution when turning   Ulcer 6/23   Vaginal delivery    x 2, 1985, 1990     Past Surgical History:  Procedure Laterality Date   COLONOSCOPY  01/23/2022   normal - 2012   LIPOMA EXCISION Right 03/27/2023   back   NASAL SEPTOPLASTY W/ TURBINOPLASTY Bilateral 10/24/2023   Procedure: Septoplasty/inferior turbinate reduction AND BALLOON SINUSPLASTY;  Surgeon: Okey Burns, MD;  Location: [facility];  Service: ENT;  Laterality: Bilateral;   NASAL SINUS SURGERY Bilateral 2015   OTHER SURGICAL HISTORY     lipoma removed  from back 03/2023   SINUS ENDO W/FUSION Bilateral 10/24/2023   Procedure: Functional Endoscopic Sinus Surgery With Navigation;  Surgeon: Okey Burns, MD;  Location: [facility];  Service: ENT;  Laterality: Bilateral;     Family History  Problem Relation Age of Onset   Colon cancer Mother    Cancer Mother    Hearing loss Mother    Heart failure Father    Asthma Father    COPD Father    Hearing loss Father    Crohn's disease Sister    Hypertension Brother    Stroke Brother    Asthma Daughter    Allergic rhinitis Grandson    Food Allergy  Grandson    Pancreatic cancer Other    Ovarian cancer Other    Uterine cancer Other     Social History   Tobacco Use   Smoking status: Never    Passive exposure: Never   Smokeless tobacco: Never  Vaping Use   Vaping status: Never Used  Substance Use Topics   Alcohol use: Yes    Alcohol/week: 1.0 standard drink of alcohol    Types: 1 Standard drinks or equivalent per week    Comment: socially   Drug use: Not Currently    Types: Marijuana    Comment: THC for sleep    Review of Systems:   Review of Systems  Constitutional:  Negative for chills, fever, malaise/fatigue and weight loss.  HENT:  Negative for hearing loss, sinus pain and sore throat.   Respiratory:  Negative for cough and hemoptysis.   Cardiovascular:  Negative for chest pain, palpitations, leg swelling and PND.  Gastrointestinal:  Negative for abdominal pain, constipation, diarrhea, heartburn, nausea and vomiting.  Genitourinary:  Negative for dysuria, frequency and urgency.  Musculoskeletal:  Negative for back pain, myalgias and neck pain.  Skin:  Negative for itching and rash.  Neurological:  Negative for dizziness, tingling, seizures and headaches.  Endo/Heme/Allergies:  Negative for polydipsia.  Psychiatric/Behavioral:  Negative for depression. The patient is not nervous/anxious.     Objective:   BP 122/62   Pulse 82   Temp 98 F (36.7 C)  (Temporal)   Ht 5' 3 (1.6 m)   Wt 140 lb (63.5 kg)   SpO2 95%   BMI 24.80 kg/m  Body mass index is 24.8 kg/m.   General Appearance:    Alert, cooperative, no distress, appears stated age  Head:    Normocephalic, without obvious abnormality, atraumatic  Eyes:    PERRL, conjunctiva/corneas clear, EOM's intact, fundi    benign, both eyes  Ears:    Normal TM's and external ear canals, both ears  Nose:   Nares normal, septum midline, mucosa normal, no drainage    or sinus tenderness  Throat:   Lips, mucosa, and tongue normal; teeth and gums normal  Neck:   Supple, symmetrical, trachea  midline, no adenopathy;    thyroid :  no enlargement/tenderness/nodules; no carotid   bruit or JVD  Back:     Symmetric, no curvature, ROM normal, no CVA tenderness  Lungs:     Clear to auscultation bilaterally, respirations unlabored  Chest Wall:    No tenderness or deformity   Heart:    Regular rate and rhythm, S1 and S2 normal, no murmur, rub or gallop  Breast Exam:    Deferred  Abdomen:     Soft, non-tender, bowel sounds active all four quadrants,    no masses, no organomegaly  Genitalia:    Deferred   Extremities:   Extremities normal, atraumatic, no cyanosis or edema  Pulses:   2+ and symmetric all extremities  Skin:   Skin color, texture, turgor normal, no rashes or lesions  Lymph nodes:   Cervical, supraclavicular, and axillary nodes normal  Neurologic:   CNII-XII intact, normal strength, sensation and reflexes    throughout    Assessment/Plan:   Assessment and Plan    General Health Maintenance Routine screenings and vaccinations discussed. - She will get COVID vaccine at her local pharmacy  Unintentional weight loss Unintentional weight loss potentially related to recent stress and altered eating habits. - Order blood work including thyroid  function tests and A1c. - She will continue to monitor her weight - She is going to schedule her mammogram - She will consider bronchoscopy as  recommended by pulmonary   Osteoporosis Osteoporosis with previous treatment with Fosamax . Bone density scan showed improvement from -4 to -3. - Continue Fosamax  as previously prescribed.  Mass of sinus Chronic sinus disease with previous recommendation for surgery. Surgery postponed due to personal circumstances. Currently asymptomatic without infection. - Proceed with sinus surgery on September 16, 2024, if she decides to go through with it.  Nocturia Nocturia with frequent nighttime urination. - Consider pelvic floor physical therapy for bladder strengthening. - Will check urination  Need for pneumococcal 20-valent conjugate vaccination; Flu vaccine need Updated today  Elevated glucose Update Hemoglobin A1c and provide recommendations   Hyperlipidemia, unspecified hyperlipidemia type Update lipid panel and provide recommendations   Vitamin D  deficiency Update and provide recommendations  Maximize therapy given osteoporosis        Lucie Buttner, PA-C [facility]

## 2024-08-22 ENCOUNTER — Ambulatory Visit: Payer: Self-pay | Admitting: Physician Assistant

## 2024-09-01 DIAGNOSIS — Z1231 Encounter for screening mammogram for malignant neoplasm of breast: Secondary | ICD-10-CM | POA: Diagnosis not present

## 2024-09-01 LAB — HM MAMMOGRAPHY

## 2024-09-03 ENCOUNTER — Encounter: Payer: Self-pay | Admitting: Physician Assistant

## 2024-09-16 DIAGNOSIS — J324 Chronic pansinusitis: Secondary | ICD-10-CM | POA: Diagnosis not present

## 2024-09-16 DIAGNOSIS — J3489 Other specified disorders of nose and nasal sinuses: Secondary | ICD-10-CM | POA: Diagnosis not present

## 2024-09-16 DIAGNOSIS — J45909 Unspecified asthma, uncomplicated: Secondary | ICD-10-CM | POA: Diagnosis not present

## 2024-09-16 DIAGNOSIS — K219 Gastro-esophageal reflux disease without esophagitis: Secondary | ICD-10-CM | POA: Diagnosis not present

## 2024-09-24 ENCOUNTER — Other Ambulatory Visit: Payer: Self-pay | Admitting: *Deleted

## 2024-09-24 ENCOUNTER — Encounter: Payer: Self-pay | Admitting: *Deleted

## 2024-09-24 DIAGNOSIS — J324 Chronic pansinusitis: Secondary | ICD-10-CM | POA: Diagnosis not present

## 2024-09-24 DIAGNOSIS — Z9889 Other specified postprocedural states: Secondary | ICD-10-CM | POA: Diagnosis not present

## 2024-09-24 DIAGNOSIS — J3489 Other specified disorders of nose and nasal sinuses: Secondary | ICD-10-CM | POA: Diagnosis not present

## 2024-09-24 DIAGNOSIS — M810 Age-related osteoporosis without current pathological fracture: Secondary | ICD-10-CM

## 2024-09-24 MED ORDER — DENOSUMAB 60 MG/ML ~~LOC~~ SOSY
60.0000 mg | PREFILLED_SYRINGE | Freq: Once | SUBCUTANEOUS | Status: AC
  Administered 2024-10-21: 60 mg via SUBCUTANEOUS

## 2024-09-24 MED ORDER — DENOSUMAB 60 MG/ML ~~LOC~~ SOSY
60.0000 mg | PREFILLED_SYRINGE | Freq: Once | SUBCUTANEOUS | 0 refills | Status: DC
  Filled 2024-09-29: qty 1, 1d supply, fill #0

## 2024-09-25 ENCOUNTER — Telehealth: Payer: Self-pay

## 2024-09-25 ENCOUNTER — Other Ambulatory Visit (HOSPITAL_COMMUNITY): Payer: Self-pay

## 2024-09-25 NOTE — Telephone Encounter (Signed)
 MEDICAL PA SUBMITTED VIA BLUE-E.       PER TEST CLAIM, PROLIA EXLUDED FROM PHARMACY COVERAGE.

## 2024-09-25 NOTE — Telephone Encounter (Signed)
 SABRA

## 2024-09-26 NOTE — Telephone Encounter (Signed)
 Buy/Bill (Office supplied medication)  Out-of-pocket cost due at time of clinic visit: $0  Number of injection/visits approved: 2  Primary: [employer] Co-insurance: 0% Admin fee co-insurance: 0%  Secondary: --- Co-insurance:  Admin fee co-insurance:   Medical Benefit Details: Date Benefits were checked: 09/11/24 Deductible: NO/ Coinsurance: 0%/ Admin Fee: 0%  Prior Auth: APPROVED PA# [id number] Expiration Date: 09/25/24-09/25/25  # of doses approved: 2 -----------------------------------------------------------------------  Patient IS eligible for Copay Card. Copay Card can make patient's cost as little as $25. Link to apply: [email or web address]://[email or web address]  ** This summary of benefits is an estimation of the patient's out-of-pocket cost. Exact cost may very based on individual plan coverage.

## 2024-09-26 NOTE — Telephone Encounter (Signed)
 Medication will be filled under Medical Benefit. MTDM appointment request cancelled.

## 2024-09-29 ENCOUNTER — Other Ambulatory Visit: Payer: Self-pay

## 2024-10-08 DIAGNOSIS — J3489 Other specified disorders of nose and nasal sinuses: Secondary | ICD-10-CM | POA: Diagnosis not present

## 2024-10-08 DIAGNOSIS — Z9889 Other specified postprocedural states: Secondary | ICD-10-CM | POA: Diagnosis not present

## 2024-10-08 DIAGNOSIS — J329 Chronic sinusitis, unspecified: Secondary | ICD-10-CM | POA: Diagnosis not present

## 2024-10-08 DIAGNOSIS — J324 Chronic pansinusitis: Secondary | ICD-10-CM | POA: Diagnosis not present

## 2024-10-08 NOTE — Telephone Encounter (Signed)
 See referral

## 2024-10-09 ENCOUNTER — Other Ambulatory Visit: Payer: Self-pay

## 2024-10-12 NOTE — Progress Notes (Unsigned)
 Follow Up Note  RE: Lindsey Knight MRN: [id number] DOB: [birth year] Date of Office Visit: 10/13/2024  Referring provider: Job Lukes, PA Primary care provider: Job Lukes, PA  Chief Complaint: No chief complaint on file.  History of Present Illness: I had the pleasure of seeing Lindsey Knight for a follow up visit at the [facility] on 10/13/2024. She is a 63 y.o. female, who is being followed for adverse drug reactions, allergic rhinitis, nasal polyp, recurrent sinusitis, asthma. Her previous allergy  office visit was on 04/16/2024 with Dr. Luke. Today is a regular follow up visit.  Discussed the use of AI scribe software for clinical note transcription with the patient, who gave verbal consent to proceed.  History of Present Illness            10/08/2024 ENT visit: Impression & Plans:   1. Chronic pansinusitis  2. Purulent post-nasal discharge  3. Status post functional endoscopic sinus surgery (FESS)   Continue nasal saline irrigation  RTC in 2 months for repeat evaluation  Assessment and Plan: [name] is a 63 y.o. 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 Patient is not sure of exact timeline as she had severe headaches/migraines which she was focused on at that time. She started Dupixent  on 09/13/2023 and last injection was last week which now thinks flared these bumps on her legs. She had extensive bloodwork drawn by ENT on 01/23/2024. Interim history - had one more bump since stopping Dupixent . Saw derm and had biopsy done as well. Finished steroid taper. Monitor symptoms. Follow up with dermatology as scheduled.  Avoid Dupixent .    Seasonal allergic rhinitis due to pollen Allergic rhinitis due to mold Nose polyp PND Past history - sinus surgery in 2013. Currently on Zyrtec, Singulair , Atrovent ,  and budesonide nasal rinses. 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 No worsening symptoms after taking NSAIDs. Elevated IgE level. 2024 skin testing positive to weed and mold. Started Dupixent  on 09/13/2023. Sinus surgery on 10/24/2023.  Interim history - had extensive labs/ct sinus by ENT. Continue recommendations as per your ENT.  Use Atrovent  (ipratropium) 0.03% 1-2 sprays per nostril twice a day as needed for runny nose/drainage. Continue Singulair  (montelukast ) 10mg  daily at night. Continue budesonide sinus rinse twice daily OR Start Xhance  (fluticasone ) nasal spray 1-2 sprays per nostril twice a day as needed for nasal congestion.  Sample given and demonstrated proper use.   Recurrent sinusitis Past history - 2025 labs normal immunoglobulin levels, tetanus, diptheria titers protective, borderline pneumococcal titers.  Keep track of infections and antibiotics use.   Moderate persistent asthma without complication Past history - managed by pulmonologist with Advair and Albuterol  as needed. Asthma exacerbations requiring prednisone  courses three times this year. Interim history - restarted Advair by pulm and feeling better. Daily controller medication(s): Advair 250mcg 1 puff twice a day and rinse mouth after each use.  May use albuterol  rescue inhaler 2 puffs every 4 to 6 hours as needed for shortness of breath, chest tightness, coughing, and wheezing. May use albuterol  rescue inhaler 2 puffs 5 to 15 minutes prior to strenuous physical activities. Monitor frequency of use - if you need to use it more than twice per week on a consistent basis let us  know.    Weight loss Monitor and please follow up with your PCP regarding this.  Assessment and Plan  No follow-ups on file.  No orders of the defined types were placed in this encounter.  Lab Orders  No laboratory test(s) ordered today    Diagnostics: Spirometry:   Tracings reviewed. Her effort: {Blank single:19197::Good reproducible efforts.,It was hard to get consistent efforts and there is a question as to whether this reflects a maximal maneuver.,Poor effort, data can not be interpreted.} FVC: ***L FEV1: ***L, ***% predicted FEV1/FVC ratio: ***% Interpretation: {Blank single:19197::Spirometry consistent with mild obstructive disease,Spirometry consistent with moderate obstructive disease,Spirometry consistent with severe obstructive disease,Spirometry consistent with possible restrictive disease,Spirometry consistent with mixed obstructive and restrictive disease,Spirometry uninterpretable due to technique,Spirometry consistent with normal pattern,No overt abnormalities noted given today's efforts}.  Please see scanned spirometry results for details.  Skin Testing: {Blank single:19197::Select foods,Environmental allergy  panel,Environmental allergy  panel and select foods,Food allergy  panel,None,Deferred due to recent antihistamines use}. *** Results discussed with patient/family.   Medication List:  Current Outpatient Medications  Medication Sig Dispense Refill   Acetaminophen  (TYLENOL  PO) Take by mouth.     albuterol  (VENTOLIN  HFA) 108 (90 Base) MCG/ACT inhaler Inhale 2 puffs into the lungs every 4 (four) hours as needed for wheezing or shortness of breath (coughing fits). 18 g 1   alendronate  (FOSAMAX ) 70 MG tablet Take 1 tablet (70 mg total) by mouth every 7 (seven) days. Take with a full glass of water on an empty stomach. 12 tablet 0   benzonatate  (TESSALON ) 100 MG capsule Take 1 capsule (100 mg total) by mouth 3 (three) times daily as needed. 30 capsule 2   budesonide (PULMICORT) 0.25 MG/2ML nebulizer solution Take 0.25 mg by nebulization 2 (two) times daily. Nasal rinse     Cetirizine HCl (ZYRTEC PO) Take by mouth.     Cholecalciferol (VITAMIN D3 ADULT GUMMIES PO)      Cyanocobalamin  (VITAMIN B-12 PO) Take  1,000 mcg by mouth daily in the afternoon.     estradiol  (ESTRACE  VAGINAL) 0.1 MG/GM vaginal cream Place 1 g vaginally 3 (three) times a week. 42.5 g 3   famotidine (PEPCID) 20 MG tablet Take 20 mg by mouth daily.     fluconazole  (DIFLUCAN ) 100 MG tablet Take by mouth.     Fluticasone  Propionate (XHANCE ) 93 MCG/ACT EXHU 1-2 sprays per nostril twice a day. 32 mL 5   fluticasone -salmeterol (ADVAIR DISKUS) 250-50 MCG/ACT AEPB Inhale 1 puff into the lungs in the morning and at bedtime. 60 each 11   Ibuprofen (ADVIL PO) Take by mouth.     ipratropium (ATROVENT ) 0.03 % nasal spray INSTILL 2 SPRAYS INTO BOTH NOSTRILS EVERY 12 HOURS 30 mL 1   MAGNESIUM GLYCINATE PO      Melatonin 5 MG CHEW Chew by mouth.     montelukast  (SINGULAIR ) 10 MG tablet TAKE 1 TABLET BY MOUTH EVERY NIGHT AT BEDTIME 90 tablet 3   Multiple Vitamins-Minerals (MULTIVITAMIN ADULT) CHEW      mupirocin  ointment (BACTROBAN ) 2 % Apply 1 Application topically 2 (two) times daily. 30 g 3   traZODone  (DESYREL ) 100 MG tablet TAKE 1 TABLET BY MOUTH EVERY NIGHT AT BEDTIME 90 tablet 0   Current Facility-Administered Medications  Medication Dose Route Frequency Provider Last Rate Last Admin   denosumab  (PROLIA ) injection 60 mg  60 mg Subcutaneous Once Chrzanowski, Jami B, NP       Romosozumab -aqqg (EVENITY ) 105 MG/1. injection 210 mg  210 mg Subcutaneous Once Chrzanowski, Jami B, NP       Allergies: Allergies  Allergen Reactions   Other Hives  Adhesives   Dupixent  [Dupilumab ]     Erythema nodosum?   Tape Rash   I reviewed her past medical history, social history, family history, and environmental history and no significant changes have been reported from her previous visit.  Review of Systems  Constitutional:  Positive for unexpected weight change. Negative for appetite change, chills and fever.  HENT:  Positive for postnasal drip, sinus pressure and sinus pain. Negative for rhinorrhea.   Eyes:  Negative for itching.   Respiratory:  Negative for cough, chest tightness, shortness of breath and wheezing.   Cardiovascular:  Negative for chest pain.  Gastrointestinal:  Negative for abdominal pain.  Genitourinary:  Negative for difficulty urinating.  Skin:  Positive for rash.  Allergic/Immunologic: Positive for environmental allergies.    Objective: There were no vitals taken for this visit. There is no height or weight on file to calculate BMI. Physical Exam Vitals and nursing note reviewed.  Constitutional:      Appearance: Normal appearance. She is well-developed.  HENT:     Head: Normocephalic and atraumatic.     Right Ear: Tympanic membrane and external ear normal.     Left Ear: Tympanic membrane and external ear normal.     Nose: No rhinorrhea.     Mouth/Throat:     Mouth: Mucous membranes are moist.     Pharynx: Oropharynx is clear.  Eyes:     Conjunctiva/sclera: Conjunctivae normal.  Cardiovascular:     Rate and Rhythm: Normal rate and regular rhythm.     Heart sounds: Normal heart sounds. No murmur heard.    No friction rub. No gallop.  Pulmonary:     Effort: Pulmonary effort is normal.     Breath sounds: Normal breath sounds. No wheezing, rhonchi or rales.  Musculoskeletal:     Cervical back: Neck supple.  Skin:    General: Skin is warm.     Findings: Rash present.     Comments: A few scattered circular discoloration on lower extremities. Some palpable nodules on calf area b/l.   Neurological:     Mental Status: She is alert and oriented to person, place, and time.  Psychiatric:        Behavior: Behavior normal.    Previous notes and tests were reviewed. The plan was reviewed with the patient/family, and all questions/concerned were addressed.  It was my pleasure to see Lindsey Knight today and participate in her care. Please feel free to contact me with any questions or concerns.  Sincerely,  Orlan Cramp, DO Allergy  & Immunology  [facility] office: [phone] [facility] office: [phone]

## 2024-10-13 ENCOUNTER — Ambulatory Visit: Admitting: Allergy

## 2024-10-13 ENCOUNTER — Encounter: Payer: Self-pay | Admitting: Allergy

## 2024-10-13 VITALS — BP 118/60 | HR 73 | Temp 98.3°F | Ht 63.0 in | Wt 143.7 lb

## 2024-10-13 DIAGNOSIS — J301 Allergic rhinitis due to pollen: Secondary | ICD-10-CM | POA: Diagnosis not present

## 2024-10-13 DIAGNOSIS — J4540 Moderate persistent asthma, uncomplicated: Secondary | ICD-10-CM | POA: Diagnosis not present

## 2024-10-13 DIAGNOSIS — R0982 Postnasal drip: Secondary | ICD-10-CM

## 2024-10-13 DIAGNOSIS — J339 Nasal polyp, unspecified: Secondary | ICD-10-CM

## 2024-10-13 DIAGNOSIS — J329 Chronic sinusitis, unspecified: Secondary | ICD-10-CM

## 2024-10-13 DIAGNOSIS — J3089 Other allergic rhinitis: Secondary | ICD-10-CM

## 2024-10-13 NOTE — Patient Instructions (Addendum)
 Rash Monitor symptoms. Follow up with dermatology as scheduled.  Avoid Dupixent .   Sinuses/polyps Continue recommendations as per your ENT.  Continue budesonide sinus rinse 1-2 times per day as needed. OR Xhance  (fluticasone ) nasal spray 1-2 sprays per nostril twice a day as needed for nasal congestion.  If polyps return, we can discuss starting a different biologics. Maybe Tezspire?  Environmental allergies 2024 skin testing positive to weed and mold. 2025 labs negative. Monitor symptoms.  Continue Singulair  (montelukast ) 10mg  daily at night.  Infections Keep track of infections and antibiotics use.  Breathing Breathing test was normal today.  Daily controller medication(s):  Advair 250mcg 1 puff once a day on Monday/Wednesday/Friday and rinse mouth after each use.  Continue Singulair  (montelukast ) 10mg  daily at night. During respiratory infections/flares:  Start Advair 250mcg 1 puffs twice a day for 1-2 weeks until your breathing symptoms return to baseline.  May use albuterol  rescue inhaler 2 puffs every 4 to 6 hours as needed for shortness of breath, chest tightness, coughing, and wheezing. May use albuterol  rescue inhaler 2 puffs 5 to 15 minutes prior to strenuous physical activities. Monitor frequency of use - if you need to use it more than twice per week on a consistent basis let us  know.  Breathing control goals:  Full participation in all desired activities (may need albuterol  before activity) Albuterol  use two times or less a week on average (not counting use with activity) Cough interfering with sleep two times or less a month Oral steroids no more than once a year No hospitalizations   Follow up in 6 months or sooner if needed.

## 2024-10-14 NOTE — Telephone Encounter (Signed)
 JC patient. Ok to proceed with Prolia  injection.

## 2024-10-21 ENCOUNTER — Ambulatory Visit (INDEPENDENT_AMBULATORY_CARE_PROVIDER_SITE_OTHER)

## 2024-10-21 ENCOUNTER — Ambulatory Visit: Admitting: Dermatology

## 2024-10-21 ENCOUNTER — Encounter: Payer: Self-pay | Admitting: Dermatology

## 2024-10-21 VITALS — BP 113/63 | HR 89

## 2024-10-21 DIAGNOSIS — L52 Erythema nodosum: Secondary | ICD-10-CM

## 2024-10-21 DIAGNOSIS — M810 Age-related osteoporosis without current pathological fracture: Secondary | ICD-10-CM

## 2024-10-21 DIAGNOSIS — H0260 Xanthelasma of unspecified eye, unspecified eyelid: Secondary | ICD-10-CM | POA: Diagnosis not present

## 2024-10-21 DIAGNOSIS — H0263 Xanthelasma of right eye, unspecified eyelid: Secondary | ICD-10-CM

## 2024-10-21 NOTE — Progress Notes (Signed)
   Follow-Up Visit   Patient (and/or pt guardian) consented to the use of AI-assisted tools for note generation.  Subjective  Lindsey Knight is a 63 y.o. female who presents for the following: Erythema Nodosum  Patient was last evaluated on 04/17/24. At this visit patient was advised to apply Silagin cream twice daily and to apply vitamin C with collagen to help with bruising.  Patient was advised to avoid dry brushing and massages on affected areas Patient reports sxs are better.  Patient denies medication changes.  Patient reports she has not been using the Silagin and states she was unable to find them She reports she has been using a normal moisturizer Patient reports sxs have improved on their own  The following portions of the chart were reviewed this encounter and updated as appropriate: medications, allergies, medical history  Review of Systems:  No other skin or systemic complaints except as noted in HPI or Assessment and Plan.  Objective  Well appearing patient in no apparent distress; mood and affect are within normal limits.  A focused examination was performed of the following areas: Bilateral lower extremities   Relevant exam findings are noted in the Assessment and Plan.                 Assessment & Plan   Xanthelasma Presence of xanthelasma with no targeted laser treatment available. Surgical excision is the primary treatment option. Discussed potential use of CO2 laser, though not standard of care. Emphasized careful treatment around the eye to avoid complications such as ectropion. Discussed potential for growth and importance of early treatment. Recommended lifestyle modifications to manage cholesterol levels, including fish oil and Metamucil. - Provided information on Dr. Nadean for consultation regarding CO2 laser treatment for xanthelasma. - Recommended fish oil and Metamucil to manage cholesterol levels.  Resolved cutaneous Erythema Nodosum drug  reaction to Dupixent  Previous cutaneous drug reaction to Dupixent , characterized by tender nodules and erythema, has resolved. No recurrence of symptoms since discontinuation of Dupixent . Reaction likely transient and related to Dupixent  use. Sinus surgery may have contributed to resolution of symptoms. - Continue using Xhance  spray as recommended by allergy  specialist for prevention of recurrence.   Return if symptoms worsen or fail to improve.  I, Lyle Cords, as acting as a neurosurgeon for [employer], DO .   Documentation: I have reviewed the above documentation for accuracy and completeness, and I agree with the above.  Delon Lenis, DO

## 2024-10-21 NOTE — Progress Notes (Signed)
 10/21/24: 1st Prolia  given SQ in right arm. Pt tolerated injection well. Pt sat for 11 minutes and decided to leave, stating she felt fine. (IC, CCMA)

## 2024-10-21 NOTE — Patient Instructions (Addendum)
 VISIT SUMMARY:  During your visit today, we discussed your skin issues and recovery from sinus surgery. You have not experienced any new skin flares since your last visit, and your previous symptoms have resolved. We also reviewed your recent sinus surgery, which has improved your breathing. 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  YOUR PLAN:  -XANTHELASMA:  Xanthelasma are yellowish deposits of cholesterol under the skin, usually around the eyes. Surgical excision is the primary treatment option, though we discussed the potential use of a CO2 laser.   It is important to treat xanthelasma early to prevent growth and complications. I recommend continuing with lifestyle modifications to manage your cholesterol levels, including taking fish oil and Metamucil.   I have provided information on Dr. Nadean for a consultation regarding CO2 laser treatment.  -RESOLVED CUTANEOUS DRUG REACTION TO DUPIXENT :  You previously had a skin reaction to Dupixent , which caused tender nodules and redness.   These symptoms have resolved since you stopped using Dupixent . It is likely that the reaction was related to the medication.   Continue using the Xhance  nasal spray as recommended by your allergy  specialist to prevent recurrence of symptoms.  INSTRUCTIONS:  Please follow up with Dr. Nadean for a consultation regarding CO2 laser treatment for your xanthelasma. Continue using fish oil and Metamucil to manage your cholesterol levels. Keep using the Xhance  nasal spray as directed by your allergy  specialist.    Option for laser treatment:  [facility]  [phone]  [phone]   Important Information  Due to recent changes in healthcare laws, you may see results of your pathology and/or laboratory studies on MyChart before the doctors have had a chance to review them. We understand that in some cases there may be results that are confusing or concerning  to you. Please understand that not all results are received at the same time and often the doctors may need to interpret multiple results in order to provide you with the best plan of care or course of treatment. Therefore, we ask that you please give us  2 business days to thoroughly review all your results before contacting the office for clarification. Should we see a critical lab result, you will be contacted sooner.   If You Need Anything After Your Visit  If you have any questions or concerns for your doctor, please call our main line at [phone] If no one answers, please leave a voicemail as directed and we will return your call as soon as possible. Messages left after 4 pm will be answered the following business day.   You may also send us  a message via MyChart. We typically respond to MyChart messages within 1-2 business days.  For prescription refills, please ask your pharmacy to contact our office. Our fax number is [phone].  If you have an urgent issue when the clinic is closed that cannot wait until the next business day, you can page your doctor at the number below.    Please note that while we do our best to be available for urgent issues outside of office hours, we are not available 24/7.   If you have an urgent issue and are unable to reach us , you may choose to seek medical care at your doctor's office, retail clinic, urgent care center, or emergency room.  If you have a medical emergency, please immediately call 911 or go to the emergency department. In the event of inclement weather, please call our main line  at [phone] for an update on the status of any delays or closures.  Dermatology Medication Tips: Please keep the boxes that topical medications come in in order to help keep track of the instructions about where and how to use these. Pharmacies typically print the medication instructions only on the boxes and not directly on the medication tubes.   If your  medication is too expensive, please contact our office at [phone] or send us  a message through MyChart.   We are unable to tell what your co-pay for medications will be in advance as this is different depending on your insurance coverage. However, we may be able to find a substitute medication at lower cost or fill out paperwork to get insurance to cover a needed medication.   If a prior authorization is required to get your medication covered by your insurance company, please allow us  1-2 business days to complete this process.  Drug prices often vary depending on where the prescription is filled and some pharmacies may offer cheaper prices.  The website www.goodrx.com contains coupons for medications through different pharmacies. The prices here do not account for what the cost may be with help from insurance (it may be cheaper with your insurance), but the website can give you the price if you did not use any insurance.  - You can print the associated coupon and take it with your prescription to the pharmacy.  - You may also stop by our office during regular business hours and pick up a GoodRx coupon card.  - If you need your prescription sent electronically to a different pharmacy, notify our office through [facility] or by phone at [phone]

## 2024-10-22 ENCOUNTER — Other Ambulatory Visit: Payer: Self-pay | Admitting: Physician Assistant

## 2024-10-22 ENCOUNTER — Other Ambulatory Visit: Payer: Self-pay | Admitting: Radiology

## 2024-10-22 DIAGNOSIS — M810 Age-related osteoporosis without current pathological fracture: Secondary | ICD-10-CM

## 2024-10-23 NOTE — Telephone Encounter (Signed)
 Med refill request: alendronate  70 mg Last AEX: 05/06/24 Next AEX: none scheduled Last MMG (if hormonal med) n/a Refill denied.  Patient changed to [place] .

## 2024-11-18 ENCOUNTER — Telehealth: Payer: Self-pay

## 2024-11-18 NOTE — Telephone Encounter (Signed)
 Effective 11/20/24, [place] will be preferred for [employer]. Received approval letter for denosumab -bmwo. Attached approval letter to chart.

## 2024-11-25 NOTE — Telephone Encounter (Signed)
 Patient will be due after 04/22/25.  Encounter closed.

## 2024-11-26 ENCOUNTER — Telehealth

## 2024-11-26 DIAGNOSIS — Z20828 Contact with and (suspected) exposure to other viral communicable diseases: Secondary | ICD-10-CM

## 2024-11-26 MED ORDER — OSELTAMIVIR PHOSPHATE 75 MG PO CAPS: 75.0000 mg | ORAL_CAPSULE | Freq: Every day | ORAL | 0 refills | Status: AC

## 2024-11-26 NOTE — Progress Notes (Signed)
 " Virtual Visit Consent   Lindsey Knight, you are scheduled for a virtual visit with a [facility] provider today. Just as with appointments in the office, your consent must be obtained to participate. Your consent will be active for this visit and any virtual visit you may have with one of our providers in the next 365 days. If you have a MyChart account, a copy of this consent can be sent to you electronically.  As this is a virtual visit, video technology does not allow for your provider to perform a traditional examination. This may limit your provider's ability to fully assess your condition. If your provider identifies any concerns that need to be evaluated in person or the need to arrange testing (such as labs, EKG, etc.), we will make arrangements to do so. Although advances in technology are sophisticated, we cannot ensure that it will always work on either your end or our end. If the connection with a video visit is poor, the visit may have to be switched to a telephone visit. With either a video or telephone visit, we are not always able to ensure that we have a secure connection.  By engaging in this virtual visit, you consent to the provision of healthcare and authorize for your insurance to be billed (if applicable) for the services provided during this visit. Depending on your insurance coverage, you may receive a charge related to this service.  I need to obtain your verbal consent now. Are you willing to proceed with your visit today? [name] has provided verbal consent on 11/26/2024 for a virtual visit (video or telephone). Delon CHRISTELLA Dickinson, PA-C  Date: 11/26/2024 8:03 AM   Virtual Visit via Video Note   I, Delon CHRISTELLA Dickinson, connected with  [name]  ([id number], Jun 18, 1961) on 11/26/2024 at  7:45 AM EST by a video-enabled telemedicine application and verified that I am speaking with the correct person using two identifiers.  Location: Patient: Virtual Visit Location Patient:  Home Provider: Virtual Visit Location Provider: Home Office   I discussed the limitations of evaluation and management by telemedicine and the availability of in person appointments. The patient expressed understanding and agreed to proceed.    History of Present Illness: Lindsey Knight is a 64 y.o. who identifies as a female who was assigned female at birth, and is being seen today for exposure to flu. 2 grandsons and daughter both have. She keeps the grandchildren and is high risk. Would like to start prophylaxis for flu.    Problems:  Patient Active Problem List   Diagnosis Date Noted   Osteoporosis without current pathological fracture 08/21/2024   Insulin resistance 02/19/2024   Chronic cough 02/07/2024   Allergic rhinitis 02/07/2024   GERD (gastroesophageal reflux disease) 02/07/2024   Trigger thumb, right thumb 07/13/2022    Allergies: Allergies[1] Medications: Current Medications[2]  Observations/Objective: Patient is well-developed, well-nourished in no acute distress.  Resting comfortably at home.  Head is normocephalic, atraumatic.  No labored breathing.  Speech is clear and coherent with logical content.  Patient is alert and oriented at baseline.    Assessment and Plan: 1. Exposure to the flu (Primary) - oseltamivir  (TAMIFLU ) 75 MG capsule; Take 1 capsule (75 mg total) by mouth daily.  Dispense: 10 capsule; Refill: 0  - Tamiflu  once daily for 10 days provided - Continue hand hygiene and masking around sick - Advised if symptoms start to increase to twice daily dosing and call for any doses to equal 5 days  -  Push fluids - Rest as needed - Follow up as needed  Follow Up Instructions: I discussed the assessment and treatment plan with the patient. The patient was provided an opportunity to ask questions and all were answered. The patient agreed with the plan and demonstrated an understanding of the instructions.  A copy of instructions were sent to the patient via  MyChart unless otherwise noted below.    The patient was advised to call back or seek an in-person evaluation if the symptoms worsen or if the condition fails to improve as anticipated.    Delon CHRISTELLA Dickinson, PA-C     [1]  Allergies Allergen Reactions   Other Hives    Adhesives   Dupixent  [Dupilumab ]     Erythema nodosum?   Tape Rash  [2]  Current Outpatient Medications:    oseltamivir  (TAMIFLU ) 75 MG capsule, Take 1 capsule (75 mg total) by mouth daily., Disp: 10 capsule, Rfl: 0   Acetaminophen  (TYLENOL  PO), Take by mouth., Disp: , Rfl:    albuterol  (VENTOLIN  HFA) 108 (90 Base) MCG/ACT inhaler, Inhale 2 puffs into the lungs every 4 (four) hours as needed for wheezing or shortness of breath (coughing fits)., Disp: 18 g, Rfl: 1   benzonatate  (TESSALON ) 100 MG capsule, Take 1 capsule (100 mg total) by mouth 3 (three) times daily as needed., Disp: 30 capsule, Rfl: 2   budesonide (PULMICORT) 0.25 MG/2ML nebulizer solution, Take 0.25 mg by nebulization 2 (two) times daily. Nasal rinse, Disp: , Rfl:    Cetirizine HCl (ZYRTEC PO), Take by mouth., Disp: , Rfl:    Cholecalciferol (VITAMIN D3 ADULT GUMMIES PO), , Disp: , Rfl:    Cyanocobalamin  (VITAMIN B-12 PO), Take 1,000 mcg by mouth daily in the afternoon., Disp: , Rfl:    estradiol  (ESTRACE  VAGINAL) 0.1 MG/GM vaginal cream, Place 1 g vaginally 3 (three) times a week., Disp: 42.5 g, Rfl: 3   famotidine (PEPCID) 20 MG tablet, Take 20 mg by mouth daily., Disp: , Rfl:    fluconazole  (DIFLUCAN ) 100 MG tablet, Take by mouth., Disp: , Rfl:    Fluticasone  Propionate (XHANCE ) 93 MCG/ACT EXHU, 1-2 sprays per nostril twice a day., Disp: 32 mL, Rfl: 5   fluticasone -salmeterol (ADVAIR DISKUS) 250-50 MCG/ACT AEPB, Inhale 1 puff into the lungs in the morning and at bedtime., Disp: 60 each, Rfl: 11   Ibuprofen (ADVIL PO), Take by mouth., Disp: , Rfl:    MAGNESIUM GLYCINATE PO, , Disp: , Rfl:    Melatonin 5 MG CHEW, Chew by mouth., Disp: , Rfl:     montelukast  (SINGULAIR ) 10 MG tablet, TAKE 1 TABLET BY MOUTH EVERY NIGHT AT BEDTIME, Disp: 90 tablet, Rfl: 3   Multiple Vitamins-Minerals (MULTIVITAMIN ADULT) CHEW, , Disp: , Rfl:    traZODone  (DESYREL ) 100 MG tablet, TAKE 1 TABLET BY MOUTH EVERY NIGHT AT BEDTIME, Disp: 90 tablet, Rfl: 0  Current Facility-Administered Medications:    Romosozumab -aqqg (EVENITY ) 105 MG/1. injection 210 mg, 210 mg, Subcutaneous, Once, Chrzanowski, Jami B, NP  "

## 2024-11-26 NOTE — Patient Instructions (Signed)
 " Lindsey Knight, thank you for joining Delon CHRISTELLA Dickinson, PA-C for today's virtual visit.  While this provider is not your primary care provider (PCP), if your PCP is located in our provider database this encounter information will be shared with them immediately following your visit.   A [facility] MyChart account gives you access to today's visit and all your visits, tests, and labs performed at [facility]  click here if you don't have a [facility] MyChart account or go to [email or web address]  Consent: (Patient) Lindsey Knight provided verbal consent for this virtual visit at the beginning of the encounter.  Current Medications:  Current Outpatient Medications:    oseltamivir  (TAMIFLU ) 75 MG capsule, Take 1 capsule (75 mg total) by mouth daily., Disp: 10 capsule, Rfl: 0   Acetaminophen  (TYLENOL  PO), Take by mouth., Disp: , Rfl:    albuterol  (VENTOLIN  HFA) 108 (90 Base) MCG/ACT inhaler, Inhale 2 puffs into the lungs every 4 (four) hours as needed for wheezing or shortness of breath (coughing fits)., Disp: 18 g, Rfl: 1   benzonatate  (TESSALON ) 100 MG capsule, Take 1 capsule (100 mg total) by mouth 3 (three) times daily as needed., Disp: 30 capsule, Rfl: 2   budesonide (PULMICORT) 0.25 MG/2ML nebulizer solution, Take 0.25 mg by nebulization 2 (two) times daily. Nasal rinse, Disp: , Rfl:    Cetirizine HCl (ZYRTEC PO), Take by mouth., Disp: , Rfl:    Cholecalciferol (VITAMIN D3 ADULT GUMMIES PO), , Disp: , Rfl:    Cyanocobalamin  (VITAMIN B-12 PO), Take 1,000 mcg by mouth daily in the afternoon., Disp: , Rfl:    estradiol  (ESTRACE  VAGINAL) 0.1 MG/GM vaginal cream, Place 1 g vaginally 3 (three) times a week., Disp: 42.5 g, Rfl: 3   famotidine (PEPCID) 20 MG tablet, Take 20 mg by mouth daily., Disp: , Rfl:    fluconazole  (DIFLUCAN ) 100 MG tablet, Take by mouth., Disp: , Rfl:    Fluticasone  Propionate (XHANCE ) 93 MCG/ACT EXHU, 1-2 sprays per nostril twice a day., Disp: 32 mL, Rfl:  5   fluticasone -salmeterol (ADVAIR DISKUS) 250-50 MCG/ACT AEPB, Inhale 1 puff into the lungs in the morning and at bedtime., Disp: 60 each, Rfl: 11   Ibuprofen (ADVIL PO), Take by mouth., Disp: , Rfl:    MAGNESIUM GLYCINATE PO, , Disp: , Rfl:    Melatonin 5 MG CHEW, Chew by mouth., Disp: , Rfl:    montelukast  (SINGULAIR ) 10 MG tablet, TAKE 1 TABLET BY MOUTH EVERY NIGHT AT BEDTIME, Disp: 90 tablet, Rfl: 3   Multiple Vitamins-Minerals (MULTIVITAMIN ADULT) CHEW, , Disp: , Rfl:    traZODone  (DESYREL ) 100 MG tablet, TAKE 1 TABLET BY MOUTH EVERY NIGHT AT BEDTIME, Disp: 90 tablet, Rfl: 0  Current Facility-Administered Medications:    Romosozumab -aqqg (EVENITY ) 105 MG/1. injection 210 mg, 210 mg, Subcutaneous, Once, Chrzanowski, Jami B, NP   Medications ordered in this encounter:  Meds ordered this encounter  Medications   oseltamivir  (TAMIFLU ) 75 MG capsule    Sig: Take 1 capsule (75 mg total) by mouth daily.    Dispense:  10 capsule    Refill:  0    Supervising Provider:   BLAISE ALEENE KIDD [[id number]]     *If you need refills on other medications prior to your next appointment, please contact your pharmacy*  Follow-Up: Call back or seek an in-person evaluation if the symptoms worsen or if the condition fails to improve as anticipated.   Virtual Care [phone]  Other Instructions Influenza, Adult Influenza is  also called the flu. It's an infection that affects your respiratory tract. This includes your nose, throat, windpipe, and lungs. The flu is contagious. This means it spreads easily from person to person. It causes symptoms that are like a cold. It can also cause a high fever and body aches. What are the causes? The flu is caused by the influenza virus. You can get it by: Breathing in droplets that are in the air after an infected person coughs or sneezes. Touching something that has the virus on it and then touching your mouth, nose, or eyes. What increases the  risk? You may be more likely to get the flu if: You don't wash your hands often. You're near a lot of people during cold and flu season. You touch your mouth, eyes, or nose without washing your hands first. You don't get a flu shot each year. You may also be more at risk for the flu and serious problems, such as a lung infection called pneumonia, if: You're older than 65. You're pregnant. Your immune system is weak. Your immune system is your body's defense system. You have a long-term, or chronic, condition, such as: Heart, kidney, or lung disease. Diabetes. A liver disorder. Asthma. You're very overweight. You have anemia. This is when you don't have enough red blood cells in your body. What are the signs or symptoms? Flu symptoms often start all of a sudden. They may last 4-14 days and include: Fever and chills. Headaches, body aches, or muscle aches. Sore throat. Cough. Runny or stuffy nose. Discomfort in your chest. Not wanting to eat as much as normal. Feeling weak or tired. Feeling dizzy. Nausea or vomiting. How is this diagnosed? The flu may be diagnosed based on your symptoms and medical history. You may also have a physical exam. A swab may be taken from your nose or throat and tested for the virus. How is this treated? If the flu is found early, you can be treated with antiviral medicine. This may be given to you by mouth or through an IV. It can help you feel less sick and get better faster. Taking care of yourself at home can also help your symptoms get better. Your health care provider may tell you to: Take over-the-counter medicines. Drink lots of fluids. The flu often goes away on its own. If you have very bad symptoms or problems caused by the flu, you may need to be treated in a hospital. Follow these instructions at home: Activity Rest as needed. Get lots of sleep. Stay home from work or school as told by your provider. Leave home only to go see your  provider. Do not leave home for other reasons until you don't have a fever for 24 hours without taking medicine. Eating and drinking Take an oral rehydration solution (ORS). This is a drink that is sold at pharmacies and stores. Drink enough fluid to keep your pee pale yellow. Try to drink small amounts of clear fluids. These include water, ice chips, fruit juice mixed with water, and low-calorie sports drinks. Try to eat bland foods that are easy to digest. These include bananas, applesauce, rice, lean meats, toast, and crackers. Avoid drinks that have a lot of sugar or caffeine in them. These include energy drinks, regular sports drinks, and soda. Do not drink alcohol. Do not eat spicy or fatty foods. General instructions     Take your medicines only as told by your provider. Use a cool mist humidifier to add moisture  to the air in your home. This can make it easier for you to breathe. You should also clean the humidifier every day. To do so: Empty the water. Pour clean water in. Cover your mouth and nose when you cough or sneeze. Wash your hands with soap and water often and for at least 20 seconds. It's extra important to do so after you cough or sneeze. If you can't use soap and water, use hand sanitizer. How is this prevented?  Get a flu shot every year. Ask your provider when you should get your flu shot. Stay away from people who are sick during fall and winter. Fall and winter are cold and flu season. Contact a health care provider if: You get new symptoms. You have chest pain. You have watery poop, also called diarrhea. You have a fever. Your cough gets worse. You start to have more mucus. You feel like you may vomit, or you vomit. Get help right away if: You become short of breath or have trouble breathing. Your skin or nails turn blue. You have very bad pain or stiffness in your neck. You get a sudden headache or pain in your face or ear. You vomit each time you eat  or drink. These symptoms may be an emergency. Call 911 right away. Do not wait to see if the symptoms will go away. Do not drive yourself to the hospital. This information is not intended to replace advice given to you by your health care provider. Make sure you discuss any questions you have with your health care provider. Document Revised: 08/09/2023 Document Reviewed: 12/14/2022 Elsevier Patient Education  2024 [employer].  If you have been instructed to have an in-person evaluation today at a local Urgent Care facility, please use the link below. It will take you to a list of all of our available [facility], including address, phone number and hours of operation. Please do not delay care.  [facility]  If you or a family member do not have a primary care provider, use the link below to schedule a visit and establish care. When you choose a [facility] primary care physician or advanced practice provider, you gain a long-term partner in health. Find a Primary Care Provider  Learn more about [facility]'s in-office and virtual care options:  - Get Care Now "

## 2024-12-15 ENCOUNTER — Other Ambulatory Visit: Payer: Self-pay

## 2025-01-12 ENCOUNTER — Ambulatory Visit: Admitting: Pulmonary Disease

## 2025-04-15 ENCOUNTER — Ambulatory Visit: Admitting: Allergy

## 2025-08-25 ENCOUNTER — Encounter: Admitting: Physician Assistant
